# Patient Record
Sex: Female | Born: 1970 | Race: White | Hispanic: Yes | Marital: Married | Smoking: Never smoker
Health system: Southern US, Community
[De-identification: ages and names within clinical notes are randomized; demographics above are authoritative.]

## PROBLEM LIST (undated history)

## (undated) DIAGNOSIS — R112 Nausea with vomiting, unspecified: Secondary | ICD-10-CM

## (undated) DIAGNOSIS — B977 Papillomavirus as the cause of diseases classified elsewhere: Secondary | ICD-10-CM

## (undated) DIAGNOSIS — K589 Irritable bowel syndrome without diarrhea: Secondary | ICD-10-CM

## (undated) DIAGNOSIS — N852 Hypertrophy of uterus: Secondary | ICD-10-CM

## (undated) DIAGNOSIS — N888 Other specified noninflammatory disorders of cervix uteri: Secondary | ICD-10-CM

## (undated) DIAGNOSIS — M549 Dorsalgia, unspecified: Secondary | ICD-10-CM

## (undated) DIAGNOSIS — N83201 Unspecified ovarian cyst, right side: Secondary | ICD-10-CM

## (undated) DIAGNOSIS — K219 Gastro-esophageal reflux disease without esophagitis: Secondary | ICD-10-CM

## (undated) DIAGNOSIS — D219 Benign neoplasm of connective and other soft tissue, unspecified: Secondary | ICD-10-CM

## (undated) DIAGNOSIS — J42 Unspecified chronic bronchitis: Secondary | ICD-10-CM

## (undated) DIAGNOSIS — I10 Essential (primary) hypertension: Secondary | ICD-10-CM

## (undated) DIAGNOSIS — N83202 Unspecified ovarian cyst, left side: Secondary | ICD-10-CM

## (undated) DIAGNOSIS — E7800 Pure hypercholesterolemia, unspecified: Secondary | ICD-10-CM

## (undated) DIAGNOSIS — Z9889 Other specified postprocedural states: Secondary | ICD-10-CM

## (undated) DIAGNOSIS — N921 Excessive and frequent menstruation with irregular cycle: Principal | ICD-10-CM

## (undated) DIAGNOSIS — D649 Anemia, unspecified: Secondary | ICD-10-CM

## (undated) DIAGNOSIS — F32A Depression, unspecified: Secondary | ICD-10-CM

## (undated) DIAGNOSIS — N941 Unspecified dyspareunia: Secondary | ICD-10-CM

## (undated) DIAGNOSIS — T8859XA Other complications of anesthesia, initial encounter: Secondary | ICD-10-CM

## (undated) HISTORY — DX: Hypertrophy of uterus: N85.2

## (undated) HISTORY — PX: HEMANGIOMA EXCISION: SHX1734

## (undated) HISTORY — DX: Dorsalgia, unspecified: M54.9

## (undated) HISTORY — DX: Irritable bowel syndrome, unspecified: K58.9

## (undated) HISTORY — PX: BREAST CYST EXCISION: SHX579

## (undated) HISTORY — DX: Excessive and frequent menstruation with irregular cycle: N92.1

## (undated) HISTORY — DX: Unspecified ovarian cyst, right side: N83.201

## (undated) HISTORY — PX: OTHER SURGICAL HISTORY: SHX169

## (undated) HISTORY — DX: Papillomavirus as the cause of diseases classified elsewhere: B97.7

## (undated) HISTORY — DX: Other specified noninflammatory disorders of cervix uteri: N88.8

## (undated) HISTORY — DX: Unspecified dyspareunia: N94.10

## (undated) HISTORY — DX: Benign neoplasm of connective and other soft tissue, unspecified: D21.9

## (undated) HISTORY — PX: UTERINE FIBROID SURGERY: SHX826

## (undated) HISTORY — DX: Unspecified ovarian cyst, right side: N83.202

## (undated) HISTORY — PX: SHOULDER SURGERY: SHX246

---

## 2006-03-28 HISTORY — PX: BREAST CYST ASPIRATION: SHX578

## 2006-11-01 ENCOUNTER — Encounter (INDEPENDENT_AMBULATORY_CARE_PROVIDER_SITE_OTHER): Payer: Self-pay | Admitting: Radiology

## 2006-11-01 ENCOUNTER — Ambulatory Visit (HOSPITAL_COMMUNITY): Admission: RE | Admit: 2006-11-01 | Discharge: 2006-11-01 | Payer: Self-pay | Admitting: Family Medicine

## 2006-12-13 ENCOUNTER — Ambulatory Visit (HOSPITAL_COMMUNITY): Admission: RE | Admit: 2006-12-13 | Discharge: 2006-12-13 | Payer: Self-pay | Admitting: Family Medicine

## 2007-07-23 ENCOUNTER — Ambulatory Visit (HOSPITAL_COMMUNITY): Admission: RE | Admit: 2007-07-23 | Discharge: 2007-07-23 | Payer: Self-pay | Admitting: Family Medicine

## 2007-12-15 ENCOUNTER — Ambulatory Visit (HOSPITAL_COMMUNITY): Admission: RE | Admit: 2007-12-15 | Discharge: 2007-12-15 | Payer: Self-pay | Admitting: Family Medicine

## 2009-11-29 ENCOUNTER — Emergency Department (HOSPITAL_COMMUNITY): Admission: EM | Admit: 2009-11-29 | Discharge: 2009-11-29 | Payer: Self-pay | Admitting: Emergency Medicine

## 2010-04-18 ENCOUNTER — Encounter: Payer: Self-pay | Admitting: Family Medicine

## 2011-01-10 LAB — BODY FLUID CULTURE

## 2012-01-30 ENCOUNTER — Encounter: Payer: Self-pay | Admitting: Cardiology

## 2013-03-12 ENCOUNTER — Other Ambulatory Visit (HOSPITAL_COMMUNITY): Payer: Self-pay | Admitting: Physician Assistant

## 2013-03-12 DIAGNOSIS — R52 Pain, unspecified: Secondary | ICD-10-CM

## 2013-03-12 DIAGNOSIS — R112 Nausea with vomiting, unspecified: Secondary | ICD-10-CM

## 2013-03-18 ENCOUNTER — Ambulatory Visit (HOSPITAL_COMMUNITY)
Admission: RE | Admit: 2013-03-18 | Discharge: 2013-03-18 | Disposition: A | Discharge status: Home / Self Care | Payer: Self-pay | Source: Ambulatory Visit | Attending: Physician Assistant | Admitting: Physician Assistant

## 2013-03-18 DIAGNOSIS — R112 Nausea with vomiting, unspecified: Secondary | ICD-10-CM | POA: Insufficient documentation

## 2013-03-18 DIAGNOSIS — R197 Diarrhea, unspecified: Secondary | ICD-10-CM | POA: Insufficient documentation

## 2013-03-18 DIAGNOSIS — R10819 Abdominal tenderness, unspecified site: Secondary | ICD-10-CM | POA: Insufficient documentation

## 2013-03-18 DIAGNOSIS — K589 Irritable bowel syndrome without diarrhea: Secondary | ICD-10-CM | POA: Insufficient documentation

## 2013-03-18 DIAGNOSIS — R52 Pain, unspecified: Secondary | ICD-10-CM

## 2013-03-18 DIAGNOSIS — R350 Frequency of micturition: Secondary | ICD-10-CM | POA: Insufficient documentation

## 2013-03-18 DIAGNOSIS — R109 Unspecified abdominal pain: Secondary | ICD-10-CM | POA: Insufficient documentation

## 2013-03-18 MED ORDER — IOHEXOL 300 MG/ML  SOLN
100.0000 mL | Freq: Once | INTRAMUSCULAR | Status: AC | PRN
  Administered 2013-03-18: 100 mL via INTRAVENOUS

## 2013-03-28 HISTORY — PX: BREAST CYST EXCISION: SHX579

## 2013-04-16 ENCOUNTER — Other Ambulatory Visit: Payer: Self-pay | Admitting: Nurse Practitioner

## 2013-04-16 ENCOUNTER — Other Ambulatory Visit: Payer: Self-pay | Admitting: *Deleted

## 2013-04-16 DIAGNOSIS — R921 Mammographic calcification found on diagnostic imaging of breast: Secondary | ICD-10-CM

## 2013-04-22 ENCOUNTER — Ambulatory Visit
Admission: RE | Admit: 2013-04-22 | Discharge: 2013-04-22 | Disposition: A | Discharge status: Home / Self Care | Payer: No Typology Code available for payment source | Source: Ambulatory Visit | Attending: *Deleted | Admitting: *Deleted

## 2013-04-22 DIAGNOSIS — R921 Mammographic calcification found on diagnostic imaging of breast: Secondary | ICD-10-CM

## 2013-10-09 ENCOUNTER — Encounter: Payer: Self-pay | Admitting: *Deleted

## 2013-11-18 ENCOUNTER — Encounter (INDEPENDENT_AMBULATORY_CARE_PROVIDER_SITE_OTHER): Payer: Self-pay

## 2013-11-18 ENCOUNTER — Other Ambulatory Visit: Payer: Self-pay | Admitting: Gastroenterology

## 2013-11-18 ENCOUNTER — Encounter: Payer: Self-pay | Admitting: Gastroenterology

## 2013-11-18 ENCOUNTER — Ambulatory Visit (INDEPENDENT_AMBULATORY_CARE_PROVIDER_SITE_OTHER): Payer: Self-pay | Admitting: Gastroenterology

## 2013-11-18 VITALS — BP 110/72 | HR 69 | Temp 98.2°F | Ht 61.0 in | Wt 127.6 lb

## 2013-11-18 DIAGNOSIS — R1013 Epigastric pain: Secondary | ICD-10-CM

## 2013-11-18 DIAGNOSIS — K3189 Other diseases of stomach and duodenum: Secondary | ICD-10-CM

## 2013-11-18 DIAGNOSIS — R109 Unspecified abdominal pain: Secondary | ICD-10-CM

## 2013-11-18 MED ORDER — DICYCLOMINE HCL 10 MG PO CAPS: 10.0000 mg | ORAL_CAPSULE | Freq: Three times a day (TID) | ORAL | Status: DC

## 2013-11-18 MED ORDER — POLYETHYLENE GLYCOL 3350 17 GM/SCOOP PO POWD: ORAL | Status: DC

## 2013-11-18 MED ORDER — PEG 3350-KCL-NA BICARB-NACL 420 G PO SOLR: 4000.0000 mL | ORAL | Status: DC

## 2013-11-18 MED ORDER — OMEPRAZOLE 20 MG PO CPDR: 20.0000 mg | DELAYED_RELEASE_CAPSULE | Freq: Every day | ORAL | Status: DC
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
## 2013-11-18 NOTE — Patient Instructions (Signed)
Start taking Prilosec each morning, 30 minutes before breakfast. This is for reflux and stomach inflammation.  Start taking Miralax one capful each evening as needed for constipation.   You may take Bentyl 1 capsule with meals and at bedtime as needed for abdominal discomfort and loose stool. Watch for constipation, dry mouth, dizziness with this.   We need to proceed with a colonoscopy and upper endoscopy once insurance is obtained.

## 2013-11-21 DIAGNOSIS — R1013 Epigastric pain: Secondary | ICD-10-CM | POA: Insufficient documentation

## 2013-11-21 DIAGNOSIS — R109 Unspecified abdominal pain: Secondary | ICD-10-CM | POA: Insufficient documentation

## 2013-11-21 NOTE — Assessment & Plan Note (Signed)
Early satiety, occasional N/V, reported scant hematemesis in the past. Likely secondary to esophagitis, gastritis, possible PUD. Scant improvement with Protonix. Trial Prilosec once daily.   Proceed with upper endoscopy in the near future with Dr. Oneida Alar. The risks, benefits, and alternatives have been discussed in detail with patient. They have stated understanding and desire to proceed.

## 2013-11-21 NOTE — Assessment & Plan Note (Signed)
43 year old Spanish-speaking female with chronic lower abdominal pain, alternating constipation and diarrhea. Occasional low-volume hematochezia. Question IBS but needs colonoscopy to exclude occult etiology.   Proceed with colonoscopy with Dr. Oneida Alar in the near future. The risks, benefits, and alternatives have been discussed in detail with the patient. They state understanding and desire to proceed.  Miralax for constipation Bentyl for loose stool

## 2013-11-26 NOTE — Progress Notes (Signed)
CC TO PCP °

## 2013-12-03 ENCOUNTER — Encounter (HOSPITAL_COMMUNITY): Payer: Self-pay | Admitting: Pharmacy Technician

## 2013-12-12 ENCOUNTER — Other Ambulatory Visit: Payer: Self-pay

## 2013-12-12 ENCOUNTER — Telehealth: Payer: Self-pay | Admitting: General Practice

## 2013-12-12 DIAGNOSIS — R1013 Epigastric pain: Secondary | ICD-10-CM

## 2013-12-12 NOTE — Telephone Encounter (Signed)
Procedure cancelled and I lmom for [name].

## 2013-12-12 NOTE — Telephone Encounter (Signed)
Patient would like to cancel her procedure for Friday and she will call back at later date to reschedule.

## 2013-12-13 ENCOUNTER — Ambulatory Visit (HOSPITAL_COMMUNITY): Admission: RE | Admit: 2013-12-13 | Payer: Self-pay | Source: Ambulatory Visit | Admitting: Gastroenterology

## 2013-12-13 ENCOUNTER — Encounter (HOSPITAL_COMMUNITY): Admission: RE | Payer: Self-pay | Source: Ambulatory Visit

## 2013-12-13 SURGERY — COLONOSCOPY
Anesthesia: Moderate Sedation

## 2013-12-24 ENCOUNTER — Telehealth: Payer: Self-pay | Admitting: Gastroenterology

## 2013-12-24 ENCOUNTER — Ambulatory Visit (HOSPITAL_COMMUNITY)
Admission: RE | Admit: 2013-12-24 | Discharge: 2013-12-24 | Disposition: A | Discharge status: Home / Self Care | Payer: Self-pay | Source: Ambulatory Visit | Attending: Gastroenterology | Admitting: Gastroenterology

## 2013-12-24 ENCOUNTER — Encounter (HOSPITAL_COMMUNITY)
Admission: RE | Disposition: A | Discharge status: Home / Self Care | Payer: Self-pay | Source: Ambulatory Visit | Attending: Gastroenterology

## 2013-12-24 DIAGNOSIS — R1013 Epigastric pain: Secondary | ICD-10-CM

## 2013-12-24 DIAGNOSIS — K921 Melena: Secondary | ICD-10-CM | POA: Insufficient documentation

## 2013-12-24 DIAGNOSIS — Q438 Other specified congenital malformations of intestine: Secondary | ICD-10-CM | POA: Insufficient documentation

## 2013-12-24 DIAGNOSIS — K648 Other hemorrhoids: Secondary | ICD-10-CM | POA: Insufficient documentation

## 2013-12-24 DIAGNOSIS — R109 Unspecified abdominal pain: Secondary | ICD-10-CM

## 2013-12-24 HISTORY — PX: COLONOSCOPY: SHX5424

## 2013-12-24 SURGERY — COLONOSCOPY
Anesthesia: Moderate Sedation

## 2013-12-24 MED ORDER — ONDANSETRON HCL 4 MG/2ML IJ SOLN
INTRAMUSCULAR | Status: AC
  Filled 2013-12-24: qty 2

## 2013-12-24 MED ORDER — FLUMAZENIL 0.5 MG/5ML IV SOLN
INTRAVENOUS | Status: AC
  Filled 2013-12-24: qty 5

## 2013-12-24 MED ORDER — MEPERIDINE HCL 100 MG/ML IJ SOLN
INTRAMUSCULAR | Status: AC
  Filled 2013-12-24: qty 2

## 2013-12-24 MED ORDER — MEPERIDINE HCL 100 MG/ML IJ SOLN
INTRAMUSCULAR | Status: DC | PRN
  Administered 2013-12-24: 25 mg

## 2013-12-24 MED ORDER — LIDOCAINE VISCOUS 2 % MT SOLN
OROMUCOSAL | Status: AC
  Filled 2013-12-24: qty 15

## 2013-12-24 MED ORDER — STERILE WATER FOR IRRIGATION IR SOLN
Status: DC | PRN
  Administered 2013-12-24: 11:00:00

## 2013-12-24 MED ORDER — FLUMAZENIL 0.5 MG/5ML IV SOLN
INTRAVENOUS | Status: DC | PRN
  Administered 2013-12-24: .5 mg via INTRAVENOUS

## 2013-12-24 MED ORDER — MIDAZOLAM HCL 5 MG/5ML IJ SOLN
INTRAMUSCULAR | Status: AC
  Filled 2013-12-24: qty 10

## 2013-12-24 MED ORDER — FLUMAZENIL 0.5 MG/5ML IV SOLN: 0.5000 mg | Freq: Once | INTRAVENOUS | Status: DC

## 2013-12-24 MED ORDER — LIDOCAINE VISCOUS 2 % MT SOLN
OROMUCOSAL | Status: DC | PRN
  Administered 2013-12-24: 1 via OROMUCOSAL

## 2013-12-24 MED ORDER — ONDANSETRON HCL 4 MG/2ML IJ SOLN
INTRAMUSCULAR | Status: DC | PRN
  Administered 2013-12-24: 4 mg via INTRAVENOUS

## 2013-12-24 MED ORDER — SODIUM CHLORIDE 0.9 % IV SOLN
INTRAVENOUS | Status: DC
  Administered 2013-12-24: 1000 mL via INTRAVENOUS

## 2013-12-24 MED ORDER — MIDAZOLAM HCL 5 MG/5ML IJ SOLN
INTRAMUSCULAR | Status: DC | PRN
  Administered 2013-12-24 (×2): 2 mg via INTRAVENOUS

## 2013-12-24 NOTE — Discharge Instructions (Addendum)
You have internal hemorrhoids. YOU DID NOT HAVE ANY POLYPS. YOUR BLOOD PRESSURE WAS LOW SO WE COULD NOT COMPLETE THE UPPER ENDOSCOPY. YOU WERE GIVEN ZOFRAN, DEMEROL, AND VERSED.    RETURN FOR UPPER ENDOSCOPY WITH PROPOFOL.  FOLLOW A HIGH FIBER DIET. AVOID ITEMS THAT CAUSE BLOATING. SEE INFO BELOW.  Next colonoscopy in 10 years.   Colonoscopy Care After Read the instructions outlined below and refer to this sheet in the next week. These discharge instructions provide you with general information on caring for yourself after you leave the hospital. While your treatment has been planned according to the most current medical practices available, unavoidable complications occasionally occur. If you have any problems or questions after discharge, call DR. FIELDS, [phone].  ACTIVITY  You may resume your regular activity, but move at a slower pace for the next 24 hours.   Take frequent rest periods for the next 24 hours.   Walking will help get rid of the air and reduce the bloated feeling in your belly (abdomen).   No driving for 24 hours (because of the medicine (anesthesia) used during the test).   You may shower.   Do not sign any important legal documents or operate any machinery for 24 hours (because of the anesthesia used during the test).    NUTRITION  Drink plenty of fluids.   You may resume your normal diet as instructed by your doctor.   Begin with a light meal and progress to your normal diet. Heavy or fried foods are harder to digest and may make you feel sick to your stomach (nauseated).   Avoid alcoholic beverages for 24 hours or as instructed.    MEDICATIONS  You may resume your normal medications.   WHAT YOU CAN EXPECT TODAY  Some feelings of bloating in the abdomen.   Passage of more gas than usual.   Spotting of blood in your stool or on the toilet paper  .  IF YOU HAD POLYPS REMOVED DURING THE COLONOSCOPY:  Eat a soft diet IF YOU HAVE NAUSEA,  BLOATING, ABDOMINAL PAIN, OR VOMITING.    FINDING OUT THE RESULTS OF YOUR TEST Not all test results are available during your visit. DR. Oneida Alar WILL CALL YOU WITHIN 7 DAYS OF YOUR PROCEDUE WITH YOUR RESULTS. Do not assume everything is normal if you have not heard from DR. FIELDS IN ONE WEEK, CALL HER OFFICE AT [phone].  SEEK IMMEDIATE MEDICAL ATTENTION AND CALL THE OFFICE: [phone] IF:  You have more than a spotting of blood in your stool.   Your belly is swollen (abdominal distention).   You are nauseated or vomiting.   You have a temperature over 101F.   You have abdominal pain or discomfort that is severe or gets worse throughout the day.  High-Fiber Diet A high-fiber diet changes your normal diet to include more whole grains, legumes, fruits, and vegetables. Changes in the diet involve replacing refined carbohydrates with unrefined foods. The calorie level of the diet is essentially unchanged. The Dietary Reference Intake (recommended amount) for adult males is 38 grams per day. For adult females, it is 25 grams per day. Pregnant and lactating women should consume 28 grams of fiber per day. Fiber is the intact part of a plant that is not broken down during digestion. Functional fiber is fiber that has been isolated from the plant to provide a beneficial effect in the body. PURPOSE  Increase stool bulk.   Ease and regulate bowel movements.   Lower cholesterol.  INDICATIONS THAT YOU NEED MORE FIBER  Constipation and hemorrhoids.   Uncomplicated diverticulosis (intestine condition) and irritable bowel syndrome.   Weight management.   As a protective measure against hardening of the arteries (atherosclerosis), diabetes, and cancer.   GUIDELINES FOR INCREASING FIBER IN THE DIET  Start adding fiber to the diet slowly. A gradual increase of about 5 more grams (2 slices of whole-wheat bread, 2 servings of most fruits or vegetables, or 1 bowl of high-fiber cereal) per day  is best. Too rapid an increase in fiber may result in constipation, flatulence, and bloating.   Drink enough water and fluids to keep your urine clear or pale yellow. Water, juice, or caffeine-free drinks are recommended. Not drinking enough fluid may cause constipation.   Eat a variety of high-fiber foods rather than one type of fiber.   Try to increase your intake of fiber through using high-fiber foods rather than fiber pills or supplements that contain small amounts of fiber.   The goal is to change the types of food eaten. Do not supplement your present diet with high-fiber foods, but replace foods in your present diet.  INCLUDE A VARIETY OF FIBER SOURCES  Replace refined and processed grains with whole grains, canned fruits with fresh fruits, and incorporate other fiber sources. White rice, white breads, and most bakery goods contain little or no fiber.   Brown whole-grain rice, buckwheat oats, and many fruits and vegetables are all good sources of fiber. These include: broccoli, Brussels sprouts, cabbage, cauliflower, beets, sweet potatoes, white potatoes (skin on), carrots, tomatoes, eggplant, squash, berries, fresh fruits, and dried fruits.   Cereals appear to be the richest source of fiber. Cereal fiber is found in whole grains and bran. Bran is the fiber-rich outer coat of cereal grain, which is largely removed in refining. In whole-grain cereals, the bran remains. In breakfast cereals, the largest amount of fiber is found in those with "bran" in their names. The fiber content is sometimes indicated on the label.   You may need to include additional fruits and vegetables each day.   In baking, for 1 cup white flour, you may use the following substitutions:   1 cup whole-wheat flour minus 2 tablespoons.   1/2 cup white flour plus 1/2 cup whole-wheat flour.   Hemorrhoids Hemorrhoids are dilated (enlarged) veins around the rectum. Sometimes clots will form in the veins. This makes  them swollen and painful. These are called thrombosed hemorrhoids. Causes of hemorrhoids include:  Constipation.   Straining to have a bowel movement.   HEAVY LIFTING HOME CARE INSTRUCTIONS  Eat a well balanced diet and drink 6 to 8 glasses of water every day to avoid constipation. You may also use a bulk laxative.   Avoid straining to have bowel movements.   Keep anal area dry and clean.   Do not use a donut shaped pillow or sit on the toilet for long periods. This increases blood pooling and pain.   Move your bowels when your body has the urge; this will require less straining and will decrease pain and pressure.   Hemorroides  (Hemorrhoids) Las hemorroides son venas inflamadas alrededor del recto o del ano. Hay dos tipos de hemorroides:   Hemorroides internas. Se forman en las venas del interior del recto. Pueden abultarse hacia el exterior e irritarse y Secretary/administrator.  Hemorroides externas. Se producen en las venas externas al ano y pueden sentirse como un bulto o zona hinchada dura y dolorosa cerca del ano. CAUSAS  Embarazo.   Obesidad.   Constipacin o diarrea.   Dificultad para mover el intestino.   Permanecer sentado durante largos perodos en el inodoro.  Levantar objetos pesados u otras actividades que impliquen esfuerzo.  Sexo anal. SNTOMAS   Dolor.   Picazn o irritacin anal.   Sangrado rectal.   Prdida fecal.   Inflamacin anal.   Uno o ms bultos en la zona anal.  DIAGNSTICO  El mdico puede diagnosticar las hemorroides mediante un examen visual. Otros estudios o anlisis que se pueden realizar son:   Examen de la zona rectal con Ardelia Mems mano enguantada (examen digital rectal).   Examen del canal anal utilizando un pequeo tubo (endoscopio).   Anlisis de sangre si ha perdido [place] cantidad significativa de [place].  Un estudio para observar el interior del colon (sigmoideoscopa o colonoscopa). TRATAMIENTO  La mayora de las  hemorroides pueden tratarse en casa. Sin embargo, si los sntomas no mejoran o tiene Nurse, children's, el mdico puede Optometrist un procedimiento para disminuir las hemorroides o extirparlas completamente. Los tratamientos posibles son:   Colocacin de una banda de goma en la base de la hemorroide para cortar la circulacin (ligadura con Forensic psychologist).   Inyeccin de una sustancia qumica para Neurosurgeon las hemorroides (escleroterapia).   Utilizacin de un instrumento para quemar las hemorroides (terapia con luz infrarroja).   Extirpacin quirrgica de la hemorroides (hemorroidectoma).   Colocacin de grapas en la hemorroides para bloquear el flujo de sangre a los tejidos (engrapado de hemorroides).  INSTRUCCIONES PARA EL CUIDADO EN EL HOGAR   Consuma alimentos con fibra, como cereales integrales, legumbres, frutos secos, frutas y verduras. Pregntele a su mdico acerca de tomar productos con fibra aadida en ellos (suplementos defibra).  Aumente la ingestin de lquidos. Beba gran cantidad de lquido para mantener la orina de tono claro o color amarillo plido.   Haga ejercicios regularmente.   Vaya al bao cuando sienta la necesidad de mover el intestino. No espere.   Evite hacer fuerza al mover el intestino.   Mantenga la zona anal limpia y seca. Use papel higinico hmedo o toallitas humedecidas despus de mover el intestino.   Puede usar o Midwife segn las indicaciones algunas cremas especiales y supositorios.   Tome slo medicamentos de venta libre o recetados, segn las indicaciones del mdico.   Tome baos de asiento tibios durante 15-20 minutos, 3-4 veces por da para Glass blower/designer y las [place].   Coloque una bolsa de hielo sobre las hemorroides si le duelen o se hinchan. Usar compresas de [employer] baos de asiento puede ser [place].   Ponga el hielo en una bolsa plstica.   Colquese una toalla entre la piel y la bolsa de hielo.   Deje el  hielo durante 15 - 20 minutos y aplquelo 3 - 4 veces por Training and development officer.   No utilice una almohada en forma de aro ni se siente en el inodoro durante perodos prolongados. Esto aumenta la afluencia de sangre y Conservation officer, historic buildings.  SOLICITE ATENCIN MDICA SI:   Aumenta el dolor y la hinchazn y no puede controlarlo con la medicacin o con Lexicographer.  Tiene un sangrado que no IT consultant.  No puede mover el intestino.  Siente dolor o tiene inflamacin fuera de la zona de las hemorroides. ASEGRESE DE QUE:   Comprende estas instrucciones.  Controlar su enfermedad.  Recibir ayuda de inmediato si no mejora o si empeora. Document Released: 03/14/2005 Document Revised: 11/14/2012 ExitCare Patient Information 2015  [employer]. This information is not intended to replace advice given to you by your health care provider. Make sure you discuss any questions you have with your health care provider. Dieta con alto contenido de fibra  (High [employer] Diet) La fibra se encuentra en frutas, verduras y granos. Una dieta con alto contenido en fibras se favorece con la adicin de ms granos enteros, legumbres, frutas y verduras en su dieta. La cantidad recomendada de fibra para los hombres adultos es de 38 g por da. Para las mujeres adultas es de 25 g por da. Las [employer] y las que amamantan deben consumir 83 gramos de fibra por Training and development officer. Si usted tiene un problema digestivo o intestinal, consulte a su mdico antes de la adicin de alimentos ricos en fibra a su dieta. Coma una variedad de alimentos ricos en fibra en lugar de slo unos pocos.  OBJETIVO   Aumentar la masa fecal.  Tener deposiciones ms regulares para evitar el estreimiento.  Reducir el colesterol.  Para evitar comer en exceso. [place]?   En caso de estreimiento y hemorroides.  En caso de diverticulosis no complicada (enfermedad intestinal) y en el sndrome del colon irritable.  Si necesita ayuda para el  control de [place].  Si desea mejorar su dieta como medida de proteccin contra la aterosclerosis, la diabetes y Science writer. [place] y cereales integrales.  Frutas, como las [place], [place], pltanos, fresas, Development worker, community y peras.  Verduras, como guisantes, zanahorias, batatas, remolachas, brcoli, repollo, espinacas y alcauciles.  Legumbres, las arvejas, soja, lentejas.  Almendras. CONTENIDO DE FIBRA DE LOS ALIMENTOS  Almidones y granos / Heritage manager (g)   Cheerios, 1 taza / 3 g  Corn Flakes, 1 taza / 0,7 g  Arroz inflado, 1  tazas / 0,3 g  Harina de avena instantnea (cocida),  taza / 2 g  Cereal de trigo escarchado, 1 taza / 5,1 g  Arroz marrn grano largo (cocido), 1 taza / 3,5 g  Arroz blanco grano largo (cocido), 1 taza / 0,6 g  Macarrones enriquecidos (cocidos), 1 taza / 2,5 g Legumbres / Fibra Diettica (g)   Frijoles cocidos (enlatados, crudos o vegetarianos),  taza / 5,2 g  Frijoles (enlatados),  taza / 6,8 g  Frijoles pintos (cocidos),  taza / 5,5 g Panes y Administrator / Heritage manager (g)   Galletas de graham o miel, 2 plazas / 0,7 g  Galletitas saladas, 3 unidades / 0,3 g  Pretzels salados comunes, 10 pedazos / 1,8 g  Pan integral, 1 rebanada / 1,9 g  Pan blanco, 1 rebanada / 0,7 g  Pan con pasas, 1 rebanada / 1,2 g  Bagel 3 oz / 2 g  Tortilla de harina, 1 oz / 0.9 g  Tortilla de maz, 1 pequea / 1,5 g  Pan de [place] o hot dog, 1 pequeo / 0,9 g Frutas / Fibra Diettica (g)   Manzana con piel, 1 mediana / 4,4 g  Pur de [employer],  taza / 1,5 g  [place],  mediano / 1,5 g  Uvas, 10 uvas / 0,4 g  Naranja, 1 pequea / 2,3 g  Pasas, 1,5 oz / 1.6 g  Meln, 1 taza / 1,4 g Vegetales / Fibra Diettica (g)   Judas verdes (en conserva),  taza / 1,3 g  Zanahorias (cocido),  taza / 2,3 g  Broccoli (cocido),  taza / 2,8 g  Guisantes (cocidos),  taza / 4,4 g  Pur de papas,  taza / 1,6  g  Lechuga, 1 taza / 0,5 g  Maz (en lata),  taza / 1,6 g  Tomate,  taza / 1,1 g 1 cup / 3 g. Document Released: 03/14/2005 Document Revised: 09/13/2011 [facility] Patient Information 2015 [place], [place]. This information is not intended to replace advice given to you by your health care provider. Make sure you discuss any questions you have with your health care provider. Colonoscopa: cuidados posteriores (Colonoscopy, Care After) Siga estas instrucciones durante las prximas semanas. Estas indicaciones le proporcionan informacin general acerca de cmo deber cuidarse despus del procedimiento. El mdico tambin podr darle instrucciones ms especficas. El tratamiento ha sido planificado segn las prcticas mdicas actuales, pero en algunos casos pueden ocurrir problemas. Comunquese con el mdico si tiene algn problema o tiene dudas despus del procedimiento. QU ESPERAR DESPUS DEL PROCEDIMIENTO  Despus del procedimiento, es comn tener las siguientes sensaciones:  Una pequea cantidad de sangre en la materia fecal.  Cantidades moderadas de gases e hinchazn o calambres abdominales leves. INSTRUCCIONES PARA EL CUIDADO EN EL HOGAR  No conduzca vehculos, opere maquinarias ni firme documentos importantes durante 24horas.  Puede ducharse y retomar sus actividades fsicas habituales, pero muvase a un ritmo ms lento durante las primeras 24horas.  Tmese descansos frecuentes durante las primeras 24horas.  Camine o colquese compresas calientes en el abdomen para ayudar a reducir los calambres e hinchazn abdominales.  Beba suficiente lquido para Consulting civil engineer orina clara o de color amarillo plido.  Puede retomar su dieta normal segn las instrucciones de su mdico. Evite los alimentos pesados o fritos que son difciles de Publishing copy.  Evite consumir alcohol durante 24horas o segn las instrucciones de su mdico.  Tome solo medicamentos de venta libre o recetados, segn las  indicaciones del mdico.  Si se obtuvo una muestra de tejido (biopsia) durante el procedimiento:  No tome aspirina ni anticoagulantes durante 7das, o segn las instrucciones de su mdico.  No consuma alcohol durante 7das o segn las instrucciones de su mdico.  Consuma alimentos livianos durante las primeras 24horas. SOLICITE ATENCIN MDICA SI: Tiene manchas persistentes de sangre en la materia fecal entre 2 y 3das posteriores al procedimiento. SOLICITE ATENCIN MDICA DE INMEDIATO SI:  Tiene ms que una pequea mancha de sangre en la materia fecal.  Elimina grandes cogulos de sangre en la materia fecal.  Tiene el abdomen hinchado (distendido).  Tiene nuseas o vmitos.  Tiene fiebre.  Siente dolor intenso en el abdomen que no se alivia con los [employer]. Document Released: 06/10/2008 Document Revised: 01/02/2013 [facility] Patient Information 2015 [place]. This information is not intended to replace advice given to you by your health care provider. Make sure you discuss any questions you have with your health care provider.

## 2013-12-24 NOTE — Progress Notes (Signed)
REVIEWED.  

## 2013-12-24 NOTE — Progress Notes (Signed)
PT INITIAL SBP 119. AFTER [id number] SBP AS LOW AS 69. SBP DID NOT IMPROVE WITH IVFs. ROMAZICON GIVEN. SBP 110. PT REMAINED COHERENT & RESPONSIVE DURING LOW SBP. BEING RECOVERED IN PACU.

## 2013-12-24 NOTE — Telephone Encounter (Signed)
Nurse Olivia Mackie) called from short stay to let us know that pt had tcs today but not her EGD because her blood pressure dropped. She needs to be rescheduled.
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
## 2013-12-24 NOTE — Progress Notes (Signed)
Blood pressure stayed low throughout colonoscopy, waited approximately 20 min for pressure to come up. Placed in trendelenburg ,forced fluids, performed  Valsalva maneuver no success. Patient awake and responsive to command.  Endoscopy Procedure cancelled per Dr. Oneida Alar order
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
## 2013-12-25 ENCOUNTER — Encounter (HOSPITAL_COMMUNITY): Payer: Self-pay | Admitting: Gastroenterology

## 2013-12-25 NOTE — Telephone Encounter (Signed)
Would she need another office visit before we reschedule her for the EGD. Please advise

## 2013-12-25 NOTE — Telephone Encounter (Signed)
Triage for EGD W/ MAC.

## 2013-12-26 NOTE — Telephone Encounter (Signed)
Forward to DS to triage

## 2014-02-19 ENCOUNTER — Telehealth: Payer: Self-pay

## 2014-02-19 NOTE — Telephone Encounter (Signed)
Gastroenterology Pre-Procedure Review  Request Date: 02/19/2014 Requesting Physician: Dr. Oneida Alar  PATIENT REVIEW QUESTIONS: The patient responded to the following health history questions as indicated:    PER DR. FIELDS NOTE PT WAS TRIAGED FOR EGD ( WAS UNABLE TO DO WHEN COLONOSCOPY WAS DONE)  1. Diabetes Melitis: no 2. Joint replacements in the past 12 months: no 3. Major health problems in the past 3 months: no 4. Has an artificial valve or MVP: no 5. Has a defibrillator: no 6. Has been advised in past to take antibiotics in advance of a procedure like teeth cleaning: no    MEDICATIONS & ALLERGIES:    Patient reports the following regarding taking any blood thinners:   Plavix? no Aspirin? no Coumadin? no  Patient confirms/reports the following medications:  Current Outpatient Prescriptions  Medication Sig Dispense Refill  . acetaminophen (TYLENOL) 500 MG tablet Take 1,000 mg by mouth every 6 (six) hours as needed for mild pain.    Marland Kitchen ibuprofen (ADVIL,MOTRIN) 200 MG tablet Take 400 mg by mouth every 6 (six) hours as needed for moderate pain.     No current facility-administered medications for this visit.    Patient confirms/reports the following allergies:  Allergies  Allergen Reactions  . Robitussin (Alcohol Free) [Guaifenesin]   . Doxycycline Hives  . Tetracyclines & Related Hives    No orders of the defined types were placed in this encounter.    AUTHORIZATION INFORMATION Primary Insurance:   ID #:  Group #:  Pre-Cert / Auth required: Pre-Cert / Auth #:   Secondary Insurance:   ID #: Group #:  Pre-Cert / Auth required: Pre-Cert / Auth #:  SCHEDULE INFORMATION: Procedure has been scheduled as follows:  Date: 03/18/2014               Time:  8:45 AM Location: [facility] Short Stay  This Gastroenterology Pre-Precedure Review Form is being routed to the following provider(s): Barney Drain, MD

## 2014-03-03 NOTE — Telephone Encounter (Signed)
MOVI PREP SPLIT DOSING, REGULAR BREAKFAST. CLEAR LIQUIDS AFTER 9 AM.  

## 2014-03-04 ENCOUNTER — Other Ambulatory Visit: Payer: Self-pay

## 2014-03-04 DIAGNOSIS — R1013 Epigastric pain: Secondary | ICD-10-CM

## 2014-03-04 NOTE — Telephone Encounter (Signed)
Instructions for the EGD mailed to pt with pre-op appt also.

## 2014-03-10 NOTE — Patient Instructions (Addendum)
Joyce Roberts  03/10/2014   Your procedure is scheduled on:  03/18/2014  Report to [facility] at  71  AM.  Call this number if you have problems the morning of surgery: [phone]   Remember:   Do not eat food or drink liquids after midnight.   Take these medicines the morning of surgery with A SIP OF WATER: none   Do not wear jewelry, make-up or nail polish.  Do not wear lotions, powders, or perfumes.   Do not shave 48 hours prior to surgery. Men may shave face and neck.  Do not bring valuables to the hospital.  [facility] is not responsible for any belongings or valuables.               Contacts, dentures or bridgework may not be worn into surgery.  Leave suitcase in the car. After surgery it may be brought to your room.  For patients admitted to the hospital, discharge time is determined by your treatment team.               Patients discharged the day of surgery will not be allowed to drive home.  Name and phone number of your driver: family  Special Instructions: N/A   Please read over the following fact sheets that you were given: Pain Booklet, Coughing and Deep Breathing, Surgical Site Infection Prevention, Anesthesia Post-op Instructions and Care and Recovery After Surgery Esophagogastroduodenoscopy Esophagogastroduodenoscopy (EGD) is a procedure to examine the lining of the esophagus, stomach, and first part of the small intestine (duodenum). A long, flexible, lighted tube with a camera attached (endoscope) is inserted down the throat to view these organs. This procedure is done to detect problems or abnormalities, such as inflammation, bleeding, ulcers, or growths, in order to treat them. The procedure lasts about 5-20 minutes. It is usually an outpatient procedure, but it may need to be performed in emergency cases in the hospital. LET YOUR CAREGIVER KNOW ABOUT:   Allergies to food or medicine.  All medicines you are taking, including vitamins, herbs,  eyedrops, and over-the-counter medicines and creams.  Use of steroids (by mouth or creams).  Previous problems you or members of your family have had with the use of anesthetics.  Any blood disorders you have.  Previous surgeries you have had.  Other health problems you have.  Possibility of pregnancy, if this applies. RISKS AND COMPLICATIONS  Generally, EGD is a safe procedure. However, as with any procedure, complications can occur. Possible complications include:  Infection.  Bleeding.  Tearing (perforation) of the esophagus, stomach, or duodenum.  Difficulty breathing or not being able to breath.  Excessive sweating.  Spasms of the larynx.  Slowed heartbeat.  Low blood pressure. BEFORE THE PROCEDURE  Do not eat or drink anything for 6-8 hours before the procedure or as directed by your caregiver.  Ask your caregiver about changing or stopping your regular medicines.  If you wear dentures, be prepared to remove them before the procedure.  Arrange for someone to drive you home after the procedure. PROCEDURE   A vein will be accessed to give medicines and fluids. A medicine to relax you (sedative) and a pain reliever will be given through that access into the vein.  A numbing medicine (local anesthetic) may be sprayed on your throat for comfort and to stop you from gagging or coughing.  A mouth guard may be placed in your mouth to protect your teeth and to keep you  from biting on the endoscope.  You will be asked to lie on your left side.  The endoscope is inserted down your throat and into the esophagus, stomach, and duodenum.  Air is put through the endoscope to allow your caregiver to view the lining of your esophagus clearly.  The esophagus, stomach, and duodenum is then examined. During the exam, your caregiver may:  Remove tissue to be examined under a microscope (biopsy) for inflammation, infection, or other medical problems.  Remove  growths.  Remove objects (foreign bodies) that are stuck.  Treat any bleeding with medicines or other devices that stop tissues from bleeding (hot cautery, clipping devices).  Widen (dilate) or stretch narrowed areas of the esophagus and stomach.  The endoscope will then be withdrawn. AFTER THE PROCEDURE  You will be taken to a recovery area to be monitored. You will be able to go home once you are stable and alert.  Do not eat or drink anything until the local anesthetic and numbing medicines have worn off. You may choke.  It is normal to feel bloated, have pain with swallowing, or have a sore throat for a short time. This will wear off.  Your caregiver should be able to discuss his or her findings with you. It will take longer to discuss the test results if any biopsies were taken. Document Released: 07/15/2004 Document Revised: 07/29/2013 Document Reviewed: 02/15/2012 [facility] Patient Information 2015 [place], [place]. This information is not intended to replace advice given to you by your health care provider. Make sure you discuss any questions you have with your health care provider. PATIENT INSTRUCTIONS POST-ANESTHESIA  IMMEDIATELY FOLLOWING SURGERY:  Do not drive or operate machinery for the first twenty four hours after surgery.  Do not make any important decisions for twenty four hours after surgery or while taking narcotic pain medications or sedatives.  If you develop intractable nausea and vomiting or a severe headache please notify your doctor immediately.  FOLLOW-UP:  Please make an appointment with your surgeon as instructed. You do not need to follow up with anesthesia unless specifically instructed to do so.  WOUND CARE INSTRUCTIONS (if applicable):  Keep a dry clean dressing on the anesthesia/puncture wound site if there is drainage.  Once the wound has quit draining you may leave it open to air.  Generally you should leave the bandage intact for twenty four hours unless  there is drainage.  If the epidural site drains for more than 36-48 hours please call the anesthesia department.  QUESTIONS?:  Please feel free to call your physician or the hospital operator if you have any questions, and they will be happy to assist you.      Esofagogastroduodenoscopa  (Esophagogastroduodenoscopy) La esofagogastroduodenoscopia es un procedimiento para examinar el revestimiento del esfago, el estmago y la primera parte del intestino delgado (duodeno). Para ver estos rganos se inserta en la garganta un tubo largo, flexible y con iluminacin, conectado a una cmara (endoscopio). Este procedimiento se realiza para Airline pilot o anormalidades como inflamacin, sangrado, lceras o tumores, con el fin de tratarlos. El procedimiento dura aproximadamente entre 5 y 68 minutos. Por lo general, se trata de un procedimiento ambulatorio, pero es posible que sea necesario realizarlo en el hospital en casos de Freight forwarder.  INFORME A SU MDICO ACERCA DE:   Alergias a alimentos o medicamentos.  Todos los [employer] [place], incluyendo vitaminas, hierbas, gotas oftlmicas, medicamentos de [place] y Proofreader.  Uso de corticoides (por va oral o cremas).  Problemas previos que usted o los [employer] de su familia hayan tenido con el uso de anestsicos.  Enfermedades de [employer].  Cirugas previas.  Otros problemas de [place].  Posibilidad de embarazo, si corresponde. RIESGOS Y COMPLICACIONES  En general es un procedimiento seguro. Sin embargo, Games developer procedimiento, pueden surgir complicaciones. Las complicaciones posibles son:   Infecciones.  Sangrado.  Ruptura (perforacin) del esfago, el estmago o del duodeno.  Dificultad para respirar o no poder respirar.  Sudoracin excesiva.  Espasmos de laringe  Disminucin de los latidos cardacos.  Presin arterial baja. ANTES DEL PROCEDIMIENTO   No coma ni beba desde las 8 horas anteriores al  procedimiento, o segn le indique el mdico.  Consulte a su mdico si debe cambiar o suspender los medicamentos que toma habitualmente.  Si [place] dientes postizos, preprese para quitrselos antes del procedimiento.  Pdale a alguna persona que la lleve a su casa luego del procedimiento. PROCEDIMIENTO   Le administrarn medicamentos y lquidos a travs de una vena. Le darn un medicamento para relajarlo (sedante) y un analgsico a travs del acceso a la vena.  Le rociarn la garganta con un medicamento para adormecer (anestsico local ) para Merchandiser, retail, nuseas y la tos.  Le colocarn un protector bucal para proteger los dientes y para evitar que muerda el endoscopio.  Le pedirn que se recueste sobre el costado izquierdo.  El endoscopio se introduce por la Advertising copywriter al esfago, el estmago y [place].  Se introduce aire a travs del endoscopio para permitirle al mdico ver el revestimiento del esfago.  Se examina el esfago, el estmago y [place]. Durante el examen, el mdico:  Podr retirar Truddie Coco de tejido para examinarla en el microscopio (biopsia) para diagnosticar inflamacin infecciones u otros problemas mdicos.  Extirpar tumores.  Retirar objetos (cuerpos extraos) que estn atascados.  Tratar el sangrado con medicamentos u otros dispositivos que impiden que los tejidos sangren (cauterizador con Freight forwarder, instrumentos de pinzamiento).  Ensanchar (dilatar) o estirar reas estrechas del esfago y Product manager.  Luego se retirar el endoscopio. DESPUS DEL PROCEDIMIENTO   Lo trasladarn una sala de recuperacin para hacerle controles. Podr volver a su casa una vez que se encuentre estable y Hydrographic surveyor.  No coma ni beba nada hasta que el anestsico local haya desaparecido. Podra ahogarse.  Es normal que se sienta hinchado, sienta dolor al tragar o Risk analyst de garganta por un corto tiempo. Esto desaparecer.  El mdico comentar los hallazgos con  usted. Demorar ms [employer] de las pruebas si se tomaron biopsias. Document Released: 07/09/2012 [facility] Patient Information 2015 [place]. This information is not intended to replace advice given to you by your health care provider. Make sure you discuss any questions you have with your health care provider.

## 2014-03-12 ENCOUNTER — Encounter (HOSPITAL_COMMUNITY)
Admission: RE | Admit: 2014-03-12 | Discharge: 2014-03-12 | Disposition: A | Discharge status: Home / Self Care | Payer: Self-pay | Source: Ambulatory Visit | Attending: Gastroenterology | Admitting: Gastroenterology

## 2014-03-12 ENCOUNTER — Encounter (HOSPITAL_COMMUNITY): Payer: Self-pay

## 2014-03-12 DIAGNOSIS — Z01812 Encounter for preprocedural laboratory examination: Secondary | ICD-10-CM | POA: Insufficient documentation

## 2014-03-12 HISTORY — DX: Other specified postprocedural states: Z98.890

## 2014-03-12 HISTORY — DX: Pure hypercholesterolemia, unspecified: E78.00

## 2014-03-12 HISTORY — DX: Other specified postprocedural states: R11.2

## 2014-03-12 LAB — BASIC METABOLIC PANEL
Anion gap: 11 (ref 5–15)
BUN: 11 mg/dL (ref 6–23)
CALCIUM: 9.2 mg/dL (ref 8.4–10.5)
CHLORIDE: 103 meq/L (ref 96–112)
CO2: 26 mEq/L (ref 19–32)
Creatinine, Ser: 0.76 mg/dL (ref 0.50–1.10)
GFR calc Af Amer: >90 mL/min (ref >90)
Glucose, Bld: 87 mg/dL (ref 70–99)
Potassium: 4.7 mEq/L (ref 3.7–5.3)
Sodium: 140 mEq/L (ref 137–147)

## 2014-03-12 LAB — HEMOGLOBIN AND HEMATOCRIT, BLOOD
HEMATOCRIT: 35.6 % — AB (ref 36.0–46.0)
Hemoglobin: 11.3 g/dL — ABNORMAL LOW (ref 12.0–15.0)

## 2014-03-12 LAB — HCG, SERUM, QUALITATIVE: Preg, Serum: NEGATIVE

## 2014-03-12 NOTE — Pre-Procedure Instructions (Signed)
Patient given information to sign up for my chart at home. 

## 2014-03-18 ENCOUNTER — Ambulatory Visit (HOSPITAL_COMMUNITY): Payer: Self-pay | Admitting: Anesthesiology

## 2014-03-18 ENCOUNTER — Ambulatory Visit (HOSPITAL_COMMUNITY)
Admission: RE | Admit: 2014-03-18 | Discharge: 2014-03-18 | Disposition: A | Discharge status: Home / Self Care | Payer: Self-pay | Source: Ambulatory Visit | Attending: Gastroenterology | Admitting: Gastroenterology

## 2014-03-18 ENCOUNTER — Encounter (HOSPITAL_COMMUNITY)
Admission: RE | Disposition: A | Discharge status: Home / Self Care | Payer: Self-pay | Source: Ambulatory Visit | Attending: Gastroenterology

## 2014-03-18 DIAGNOSIS — B9681 Helicobacter pylori [H. pylori] as the cause of diseases classified elsewhere: Secondary | ICD-10-CM | POA: Insufficient documentation

## 2014-03-18 DIAGNOSIS — E780 Pure hypercholesterolemia: Secondary | ICD-10-CM | POA: Insufficient documentation

## 2014-03-18 DIAGNOSIS — K2970 Gastritis, unspecified, without bleeding: Secondary | ICD-10-CM

## 2014-03-18 DIAGNOSIS — K2960 Other gastritis without bleeding: Secondary | ICD-10-CM | POA: Insufficient documentation

## 2014-03-18 DIAGNOSIS — R1013 Epigastric pain: Secondary | ICD-10-CM | POA: Insufficient documentation

## 2014-03-18 DIAGNOSIS — K6389 Other specified diseases of intestine: Secondary | ICD-10-CM | POA: Insufficient documentation

## 2014-03-18 DIAGNOSIS — K219 Gastro-esophageal reflux disease without esophagitis: Secondary | ICD-10-CM | POA: Insufficient documentation

## 2014-03-18 HISTORY — PX: BIOPSY: SHX5522

## 2014-03-18 HISTORY — PX: ESOPHAGOGASTRODUODENOSCOPY (EGD) WITH PROPOFOL: SHX5813

## 2014-03-18 SURGERY — ESOPHAGOGASTRODUODENOSCOPY (EGD) WITH PROPOFOL
Anesthesia: Monitor Anesthesia Care | Site: Esophagus

## 2014-03-18 MED ORDER — FENTANYL CITRATE 0.05 MG/ML IJ SOLN: 25.0000 ug | INTRAMUSCULAR | Status: DC | PRN

## 2014-03-18 MED ORDER — SCOPOLAMINE 1 MG/3DAYS TD PT72
1.0000 | MEDICATED_PATCH | Freq: Once | TRANSDERMAL | Status: DC
  Administered 2014-03-18: 1.5 mg via TRANSDERMAL

## 2014-03-18 MED ORDER — ONDANSETRON HCL 4 MG/2ML IJ SOLN
4.0000 mg | Freq: Once | INTRAMUSCULAR | Status: AC
  Administered 2014-03-18: 4 mg via INTRAVENOUS

## 2014-03-18 MED ORDER — DEXAMETHASONE SODIUM PHOSPHATE 4 MG/ML IJ SOLN
4.0000 mg | Freq: Once | INTRAMUSCULAR | Status: AC
  Administered 2014-03-18: 4 mg via INTRAVENOUS

## 2014-03-18 MED ORDER — PROPOFOL 10 MG/ML IV EMUL
INTRAVENOUS | Status: AC
  Filled 2014-03-18: qty 20

## 2014-03-18 MED ORDER — MIDAZOLAM HCL 5 MG/5ML IJ SOLN
INTRAMUSCULAR | Status: DC | PRN
  Administered 2014-03-18: 2 mg via INTRAVENOUS

## 2014-03-18 MED ORDER — FENTANYL CITRATE 0.05 MG/ML IJ SOLN
INTRAMUSCULAR | Status: AC
  Filled 2014-03-18: qty 2

## 2014-03-18 MED ORDER — FENTANYL CITRATE 0.05 MG/ML IJ SOLN
25.0000 ug | INTRAMUSCULAR | Status: AC
Start: 2014-03-18 — End: 2014-03-18
  Administered 2014-03-18 (×2): 25 ug via INTRAVENOUS

## 2014-03-18 MED ORDER — MIDAZOLAM HCL 2 MG/2ML IJ SOLN
INTRAMUSCULAR | Status: AC
  Filled 2014-03-18: qty 2

## 2014-03-18 MED ORDER — SCOPOLAMINE 1 MG/3DAYS TD PT72
MEDICATED_PATCH | TRANSDERMAL | Status: AC
Start: 2014-03-18 — End: 2014-03-18
  Filled 2014-03-18: qty 1

## 2014-03-18 MED ORDER — ONDANSETRON HCL 4 MG/2ML IJ SOLN: 4.0000 mg | Freq: Once | INTRAMUSCULAR | Status: DC | PRN

## 2014-03-18 MED ORDER — LIDOCAINE VISCOUS 2 % MT SOLN
3.0000 mL | Freq: Once | OROMUCOSAL | Status: AC
  Administered 2014-03-18: 3 mL via OROMUCOSAL

## 2014-03-18 MED ORDER — STERILE WATER FOR IRRIGATION IR SOLN
Status: DC | PRN
  Administered 2014-03-18: 10:00:00

## 2014-03-18 MED ORDER — ONDANSETRON HCL 4 MG/2ML IJ SOLN
INTRAMUSCULAR | Status: AC
  Filled 2014-03-18: qty 2

## 2014-03-18 MED ORDER — LACTATED RINGERS IV SOLN
INTRAVENOUS | Status: DC
  Administered 2014-03-18: 1000 mL via INTRAVENOUS

## 2014-03-18 MED ORDER — DEXAMETHASONE SODIUM PHOSPHATE 4 MG/ML IJ SOLN
INTRAMUSCULAR | Status: AC
Start: 2014-03-18 — End: 2014-03-18
  Filled 2014-03-18: qty 1

## 2014-03-18 MED ORDER — OMEPRAZOLE 20 MG PO CPDR: DELAYED_RELEASE_CAPSULE | ORAL | Status: DC

## 2014-03-18 MED ORDER — MIDAZOLAM HCL 2 MG/2ML IJ SOLN
1.0000 mg | INTRAMUSCULAR | Status: DC | PRN
  Administered 2014-03-18 (×2): 1 mg via INTRAVENOUS

## 2014-03-18 MED ORDER — PROPOFOL INFUSION 10 MG/ML OPTIME
INTRAVENOUS | Status: DC | PRN
  Administered 2014-03-18: 75 ug/kg/min via INTRAVENOUS

## 2014-03-18 MED ORDER — LIDOCAINE VISCOUS 2 % MT SOLN
OROMUCOSAL | Status: AC
  Filled 2014-03-18: qty 15

## 2014-03-18 MED ORDER — SODIUM CHLORIDE 0.9 % IV SOLN: INTRAVENOUS | Status: DC

## 2014-03-18 MED ORDER — GLYCOPYRROLATE 0.2 MG/ML IJ SOLN
INTRAMUSCULAR | Status: AC
  Filled 2014-03-18: qty 1

## 2014-03-18 SURGICAL SUPPLY — 9 items
BLOCK BITE 60FR ADLT L/F BLUE (MISCELLANEOUS) ×2 IMPLANT
FLOOR PAD 36X40 (MISCELLANEOUS) ×2
FORCEPS BIOP RAD 4 LRG CAP 4 (CUTTING FORCEPS) ×2 IMPLANT
FORMALIN 10 PREFIL 20ML (MISCELLANEOUS) ×4 IMPLANT
KIT CLEAN ENDO COMPLIANCE (KITS) ×2 IMPLANT
MANIFOLD NEPTUNE II (INSTRUMENTS) ×2 IMPLANT
PAD FLOOR 36X40 (MISCELLANEOUS) ×1 IMPLANT
TUBING IRRIGATION ENDOGATOR (MISCELLANEOUS) ×2 IMPLANT
WATER STERILE IRR 1000ML POUR (IV SOLUTION) ×2 IMPLANT

## 2014-03-18 NOTE — Op Note (Signed)
[facility] [address] [place], [address]   ENDOSCOPY PROCEDURE REPORT  PATIENT: Joyce Roberts, Joyce Roberts  MR#: [id number] BIRTHDATE: December 13, 1970 , 43  yrs. old GENDER: female  ENDOSCOPIST: Barney Drain, MD REFERRED ID:[id number] [place], PA-C  PROCEDURE DATE: 03-21-14 PROCEDURE:   EGD w/ biopsy INDICATIONS:dyspepsia. MEDICATIONS: Monitored anesthesia care TOPICAL ANESTHETIC:   Viscous Xylocaine ASA CLASS:  DESCRIPTION OF PROCEDURE:     Physical exam was performed.  Informed consent was obtained from the patient after explaining the benefits, risks, and alternatives to the procedure.  The patient was connected to the monitor and placed in the left lateral position.  Continuous oxygen was provided by nasal cannula and IV medicine administered through an indwelling cannula.  After administration of sedation, the patients esophagus was intubated and the     endoscope was advanced under direct visualization to the second portion of the duodenum.  The scope was removed slowly by carefully examining the color, texture, anatomy, and integrity of the mucosa on the way out.  The patient was recovered in endoscopy and discharged home in satisfactory condition.   ESOPHAGUS: The mucosa of the esophagus appeared normal.   STOMACH: Mild non-erosive gastritis (inflammation) was found.  Multiple biopsies were performed using cold forceps.   DUODENUM: The duodenal mucosa showed no abnormalities in the bulb and 2nd part of the duodenum.  Cold forceps biopsies were taken in the bulb and second portion. COMPLICATIONS: There were no immediate complications.  ENDOSCOPIC IMPRESSION: 1.   DYSPEPSIA DUE TO GERD/GASTRITIS 2.   MILD Non-erosive gastritis  RECOMMENDATIONS: TAKE OMEPRAZOLE 30 MINUTES PRIOR TO MEALS TWICE DAILY FOR 3 MOS THEN ONCE DAILY FOR THE NEXT YEAR. AVOID TRIGGERS FOR GASTRITIS. FOLLOW A LOW FAT DIET. FOLLOW UP IN 4 MOS.  REPEAT  EXAM:   _______________________________ eSignedBarney Drain, MD March 21, 2014 7:39 PM     CPT CODES: ICD CODES:  The ICD and CPT codes recommended by this software are interpretations from the data that the clinical staff has captured with the software.  The verification of the translation of this report to the ICD and CPT codes and modifiers is the sole responsibility of the health care institution and practicing physician where this report was generated.  [place]. will not be held responsible for the validity of the ICD and CPT codes included on this report.  AMA assumes no liability for data contained or not contained herein. CPT is a Designer, television/film set of the [employer].
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
## 2014-03-18 NOTE — Progress Notes (Signed)
interpreter at side. Windle Guard

## 2014-03-18 NOTE — Anesthesia Postprocedure Evaluation (Signed)
  Anesthesia Post-op Note  Patient: Joyce Roberts  Procedure(s) Performed: Procedure(s) with comments: ESOPHAGOGASTRODUODENOSCOPY (EGD) WITH PROPOFOL (N/A) BIOPSY (N/A) - duodenal and gastric biopsies  Patient Location: PACU  Anesthesia Type:MAC  Level of Consciousness: awake, alert , oriented and patient cooperative  Airway and Oxygen Therapy: Patient Spontanous Breathing  Post-op Pain: none  Post-op Assessment: Post-op Vital signs reviewed, Patient's Cardiovascular Status Stable, Respiratory Function Stable, Patent Airway and No signs of Nausea or vomiting  Post-op Vital Signs: Reviewed and stable  Last Vitals:  Filed Vitals:   03/18/14 0930  BP: 118/75  Temp:   Resp: 23    Complications: No apparent anesthesia complications

## 2014-03-18 NOTE — Transfer of Care (Signed)
Immediate Anesthesia Transfer of Care Note  Patient: Joyce Roberts  Procedure(s) Performed: Procedure(s) with comments: ESOPHAGOGASTRODUODENOSCOPY (EGD) WITH PROPOFOL (N/A) BIOPSY (N/A) - duodenal and gastric biopsies  Patient Location: PACU  Anesthesia Type:MAC  Level of Consciousness: awake and patient cooperative  Airway & Oxygen Therapy: Patient Spontanous Breathing and Patient connected to face mask oxygen  Post-op Assessment: Report given to PACU RN, Post -op Vital signs reviewed and stable and Patient moving all extremities  Post vital signs: Reviewed and stable  Complications: No apparent anesthesia complications

## 2014-03-18 NOTE — Anesthesia Preprocedure Evaluation (Addendum)
Anesthesia Evaluation  Patient identified by MRN, date of birth, ID band Patient awake    Reviewed: Allergy & Precautions, H&P , NPO status , Patient's Chart, lab work & pertinent test results  History of Anesthesia Complications (+) PONV and history of anesthetic complications  Airway Mallampati: I  TM Distance: >3 FB Neck ROM: Full    Dental  (+) Teeth Intact   Pulmonary neg pulmonary ROS,  breath sounds clear to auscultation        Cardiovascular negative cardio ROS  Rhythm:Regular Rate:Normal     Neuro/Psych    GI/Hepatic Dyspepsia    Endo/Other    Renal/GU      Musculoskeletal   Abdominal   Peds  Hematology   Anesthesia Other Findings   Reproductive/Obstetrics                           Anesthesia Physical Anesthesia Plan  ASA: II  Anesthesia Plan: MAC   Post-op Pain Management:    Induction: Intravenous  Airway Management Planned: Simple Face Mask  Additional Equipment:   Intra-op Plan:   Post-operative Plan:   Informed Consent: I have reviewed the patients History and Physical, chart, labs and discussed the procedure including the risks, benefits and alternatives for the proposed anesthesia with the patient or authorized representative who has indicated his/her understanding and acceptance.     Plan Discussed with:   Anesthesia Plan Comments:         Anesthesia Quick Evaluation

## 2014-03-18 NOTE — Discharge Instructions (Signed)
You have gastritis. I biopsied your stomach  AND SMALL BOWEL.   TAKE OMEPRAZOLE 30 MINUTES PRIOR TO MEALS TWICE DAILY FOR 3 MOS THEN ONCE DAILY FOR THE NEXT YEAR.  AVOID TRIGGERS FOR GASTRITIS. SEE INFO BELOW.  FOLLOW A LOW FAT DIET. SEE INFO BELOW.  FOLLOW UP IN 4 MOS.  Esofagogastroduodenoscopa - Cuidados posteriores  (Esophagogastroduodenoscopy, Care After)  Siga estas instrucciones durante las prximas semanas. Estas indicaciones le proporcionan informacin general acerca de cmo deber cuidarse despus del procedimiento. El mdico tambin podr darle instrucciones ms especficas. El tratamiento ha sido planificado segn las prcticas mdicas actuales, pero en algunos casos pueden ocurrir complicaciones. Comunquese con el mdico si tiene algn problema o tiene preguntas despus del procedimiento.  INSTRUCCIONES PARA EL CUIDADO EN EL HOGAR  No coma ni beba nada hasta que el medicamento para adormecer (anestsico local) haya desaparecido y su reflejo nauseoso haya vuelto. Usted sabr que el anestsico local se ha eliminado cuando usted pueda tragar cmodamente.  No conduzca vehculos por 12 horas despus de finalizado el procedimiento o segn las indicaciones de su mdico.  Tome slo la medicacin que le indic el profesional. SOLICITE ATENCIN MDICA SI:  No puede dejar de toser.  No puede orinar u orina menos que lo habitual. SOLICITE ATENCIN MDICA DE INMEDIATO SI:  Tiene dificultad para tragar.  No puede comer o beber.  Siente cada vez ms dolor en la garganta o en el pecho.  Se siente mareado, tiene vahdos o se desmaya.  Tiene nuseas o vmitos.  Tiene escalofros.  Tiene fiebre.  Siente un dolor abdominal intenso.  La materia fecal es negra, de aspecto alquitranado o tiene [place].  Gastritis - Adultos  (Gastritis, Adult)    La gastrittis es la irritacin (inflamacin) de la membrana interna del estmago. Puede ser Ardelia Mems enfermedad de inicio sbito (aguda) o de largo  plazo (crnica). Si la gastritis no se trata, puede causar sangrado y lceras.  CAUSAS  La gastritis se produce cuando la membrana que tapiza interiormente al estmago se debilita o se daa. Los jugos digestivos del estmago inflaman el revestimiento del estmago debilitado. El revestimiento del estmago puede debilitarse o daarse por una infeccin viral o bacteriana. La infeccin bacteriana ms comn es la infeccin por Helicobacter pylori. Tambin puede ser el resultado del consumo excesivo de alcohol, por el uso de ciertos medicamentos o porque hay demasiado cido en el estmago.  SNTOMAS  En algunos casos no hay sntomas. Si se presentan sntomas, stos pueden ser:  Dolor o sensacin de ardor en la parte superior del abdomen.  Nuseas.  Vmitos.  Sensacin molesta de distensin despus de comer. DIAGNSTICO  El mdico puede diagnosticar gastritis segn los sntomas y el examen fsico. Para determinar la causa de la gastritis, el mdico podr:  Pedir anlisis de sangre o de materia fecal para diagnosticar la presencia de la bacteria H pylori.  Gastroscopa. Un tubo delgado y flexible (endoscopio) se pasa por Industrial/product designer al [employer]. El endoscopio tiene [place] luz y una cmara en el extremo. El mdico utilizar el endoscopio para observar el interior del [place].  Tomar una muestra de tejido (biopsia) del estmago para examinarlo en el microscopio. TRATAMIENTO  Segn la causa de la gastritis podrn recetarle: Antibiticos, si la causa es una infeccin bacteriana, como una infeccin por H. pylori. Anticidos o bloqueadores H2, si hay demasiado cido en el estmago. El [employer] aconsejar que deje de tomar aspirina, ibuprofeno u otros antiinflamatorios no esteroides (AINE).  INSTRUCCIONES PARA EL CUIDADO EN EL HOGAR  Tome slo medicamentos de venta libre o recetados, segn las indicaciones del mdico.  Si le han recetado antibiticos, tmelos segn las indicaciones. Tmelos todos,  aunque se sienta mejor.  Debe ingerir gran cantidad de lquido para mantener la orina de tono claro o color amarillo plido.  Evite las comidas y bebidas que empeoran los [place]:  [place] con cafena o alcohlicas.  Chocolate.  Sabores a English as a second language teacher.  Ajo y cebolla.  Comidas muy condimentadas.  Ctricos como naranjas, limones o limas.  Alimentos que contengan tomate, como salsas, [place] y pizza.  Alimentos fritos y Radio broadcast assistant. Haga comidas pequeas durante Psychiatrist de 3 comidas abundantes. SOLICITE ATENCIN MDICA DE INMEDIATO SI:  La materia fecal es negra o de color rojo oscuro.  Vomita sangre de color rojo brillante o material similar a granos de caf.  No puede retener los lquidos.  El dolor abdominal empeora.  Tiene fiebre.  No mejora luego de 1 semana.  Tiene preguntas o preocupaciones. ASEGRESE DE QUE:  Comprende estas instrucciones.  Controlar su enfermedad.  Solicitar ayuda de inmediato si no mejora o si empeora. Document Released: 12/22/2004 Document Revised: 12/07/2011  [facility] Patient Information 2015 [place]. This information is not intended to replace advice given to you by your health care provider. Make sure you discuss any questions you have with your health care provider.   Dieta para el control del colesterol y las grasas  (Fat and Cholesterol Control Diet)  Su dieta tiene un [employer] [place] de grasa y colesterol en su sangre y rganos. El exceso de [place] y colesterol en la sangre puede afectar su:  Corazn.  Vasos sanguneos (arterias, venas).  Vescula biliar.  Hgado.  Pncreas. CONTROL DE LA GRASA Y EL COLESTEROL CON LA DIETA  Ciertos alimentos pueden subir el colesterol y 71 pueden [place]. Es importante que reemplace las grasas malas por otros tipos de [place].  No consuma:  Carnes grasas, como las salchichas y [place].  Margarina en barra y Building services engineer en tubo que tienen "aceites parcialmente hidrogenados" en  ellos.  Productos horneados, como galletas dulces y saladas que tienen "aceites parcialmente hidrogenados" en ellos.  Los Walt Disney, como los aceites de coco y de [place].   Consuma los siguientes alimentos:  Cortes de peceto o lomo de carne roja.  Pollo (sin piel).  Pescado.  Ternera.  Carne de [employer].  Mariscos.  Frutas, como manzanas.  Verduras, como el brcoli, papas y zanahorias.  Frijoles, arvejas y lentejas (legumbres).  Cereales, como [place], arroz, cuscs y trigo bulgur.  Pastas (sin salsas de crema). Busque alimentos sin grasas, descremados y bajos en colesterol. Encontrar alimentos con fibra soluble y Dentist (fitosteroles). Usted debe comer 2 gramos al da de estos alimentos.  IDENTIFIQUE LOS ALIMENTOS BAJOS EN GRASAS Y COLESTEROL  Encuentre los alimentos con fibra soluble y esteroles vegetales (fitosteroles). Debe consumir 2 gramos al da de estos alimentos. Ellos son:  Lambert Mody.  Vegetales.  Granos enteros.  Frijoles y [employer].  Frutos secos y semillas. Sigel etiquetas del paquete. Busque los alimentos bajos en grasas saturadas, libres en grasas trans, y de bajo [place].  Elija quesos que contengan slo 2 a 3 gramos de grasa saturada por onza.  Use margarina que sea saludable para el corazn que sea [place] de grasas trans o aceites parcialmente hidrogenados. Evite comprar productos horneados que contengan aceites parcialmente hidrogenados. En su lugar, compre productos  horneados elaborados con cereales integrales (trigo integral o harina de avena integral). Evite los productos horneados en cuya etiqueta diga con "harina" o "harina enriquecida".  Compre sopas en lata que no sean cremosas, con bajo contenido de sal y sin grasas adicionadas. PREPARE SUS ALIMENTOS USTED MISMO  Cocine los alimentos hervidos, horneados, al vapor o asados. No fra los alimentos.  Utilice un spray antiadherente para cocinar.  Use limn o hierbas para  condimentar comidas en lugar de usar mantequilla o margarina.  Use yogur descremados, salsas o aderezos para ensaladas con bajo contenido de [place]. BAJO EN GRASAS SATURADAS / SUSTITUTOS BAJOS EN GRASA  Carnes / grasas saturadas (g)  Evite: Bife, veteado (3 oz/85 g) / 11 g  Elija: Bife, magro(3 oz/85 g) / 4 g Evite: [place]uesa (3 oz/85 g) / 7 g  Elija: [place], magra (3 oz/85 g) / 5 g Evite: Jamn (3 oz/85 g) / 6 g  Elija: Jamn, corte magro (3 oz/85 g) / 2,4 g Evite: Pollo con piel, carne oscura (3 oz/85 g) / 4 g  Elija: Pollo, sin piel, carne oscura (3 oz/85 g) / 2 g Evite: Pollo con piel, carne blanca (3 oz/85 g) / 2,5 g  Elija: Pollo, sin piel, carne blanca (3 oz/85 g) / 1 g Lcteos / Grasa saturada (g)  Evite: Leche entera (1 taza) / 5 g  Elija: Leche descremada, 2% (1 taza) / 3 g  Elija: Leche descremada, 1% (1 taza) / 1,5 g  Elija: Leche descremada, 1 taza / 0,3 g Evite: Queso duro (1 oz/28 g) / 6 g  Elija: Queso de [employer] (1 oz/28 g) / 2 a 3 g Evite: Queso cottage, 4% de grasa (1 taza) / 6,5 g  Elija: Queso cottage bajo en grasa, 1% de grasa (1 taza) / 1,5 g Evite: Helado (1 taza) / 9 g  Elija: Sorbete (1 taza) / 2,5 g  Elija: Yogur congelado descremado (1 taza) / 0,3 g  Elija: Barra de frutas congeladas / trace Evite: Crema batida (1 cucharada) / 3,5 g  Elija: Crema batida no lctea (1 cucharada) / 1 g Condimentos / Grasas Saturadas (g)  Evite: Mayonesa (1 cucharada) / 2 g  Elija: [place]ones[place] baja en grasa (1 cucharada) / 1 g Evite: Mantequilla (1 cucharada) / 7 g  Elija: Margarina light extra (1 cucharada) / 1 g Evite: Aceite de coco (1 cucharada) / 11,8 g  Elija: Aceite de oliva (1 cucharada) / 1,8 g  Elija: Aceite de maz (1 cucharada) / 1,7 g  Elija: Aceite de crtamo (1 cucharada) / 1,2 g  Elija: Aceite de girasol (1 cucharada) / 1,4 g  Elija: Aceite de soja (1 cucharada) / 2,4 g  Elija: Aceite de canola (1 cucharada) / 1 g Document Released:  09/13/2011 Document Revised: 07/09/2012  [employer] Patient Information 2015 [place], [place]. This information is not intended to replace advice given to you by your health care provider. Make sure you discuss any questions you have with your health care provider.   Document Released: 07/09/2012  [facility] Patient Information 2015 [place]. This information is not intended to replace advice given to you by your health care provider. Make sure you discuss any questions you have with your health care provider.

## 2014-03-19 ENCOUNTER — Encounter (HOSPITAL_COMMUNITY): Payer: Self-pay | Admitting: Gastroenterology

## 2014-04-17 ENCOUNTER — Telehealth: Payer: Self-pay | Admitting: Gastroenterology

## 2014-04-17 ENCOUNTER — Other Ambulatory Visit: Payer: Self-pay

## 2014-04-17 DIAGNOSIS — D582 Other hemoglobinopathies: Secondary | ICD-10-CM

## 2014-04-17 MED ORDER — AMOXICILLIN 500 MG PO TABS: ORAL_TABLET | ORAL | Status: DC

## 2014-04-17 MED ORDER — CLARITHROMYCIN 500 MG PO TABS: ORAL_TABLET | ORAL | Status: DC

## 2014-04-17 NOTE — Telephone Encounter (Signed)
PLEASE CALL PT.  Her stomach Bx showed H. Pylori infection. THIS IS THE REASON WHY SHE HAS NAUSEA VOMITING, AND OCCASIONALLY SEES BLOOD IN HER VOMIT. IT IS ALSO MAKING HER HAVE A BORDERLINE LOW BLOOD COUNT.  She needs AMOXICILLIN 500 mg 2 po BID for 10 days and Biaxin 500 mg po bid for 10 days. CONTINUE Omeprazole BID for 3 MOS THEN ONCE DAILY. Med side effects include NVD, abd pain, and metallic taste. OPV IN 4 MOS W/ AS E 30 H PYLORI GASTRITIS, DIARRHEA/CONSTIPATION  AND WE WILL RECHECK HER BLOOD COUNT 1 WEEK PRIOR TO HER VISIT.

## 2014-04-17 NOTE — Telephone Encounter (Signed)
PT is aware of results and how to take meds. Will call if there are problems.

## 2014-04-17 NOTE — Telephone Encounter (Signed)
Lab order on file for April labs.

## 2014-04-17 NOTE — Telephone Encounter (Signed)
PATIENT SCHEDULED FOR FOLLOW UP APPOINTMENT

## 2014-06-18 ENCOUNTER — Other Ambulatory Visit: Payer: Self-pay

## 2014-06-18 DIAGNOSIS — D582 Other hemoglobinopathies: Secondary | ICD-10-CM

## 2014-07-15 ENCOUNTER — Ambulatory Visit: Payer: Self-pay | Admitting: Gastroenterology

## 2014-07-15 ENCOUNTER — Telehealth: Payer: Self-pay | Admitting: Gastroenterology

## 2014-07-15 ENCOUNTER — Encounter: Payer: Self-pay | Admitting: Gastroenterology

## 2014-07-15 LAB — HEMATOCRIT: HCT: 34.8 % — ABNORMAL LOW (ref 36.0–46.0)

## 2014-07-15 LAB — HEMOGLOBIN: Hemoglobin: 10.5 g/dL — ABNORMAL LOW (ref 12.0–15.0)

## 2014-07-15 NOTE — Telephone Encounter (Signed)
PATIENT WAS A NO SHOW 07/12/14 AND LETTER WAS SEN T

## 2014-07-28 ENCOUNTER — Other Ambulatory Visit: Payer: Self-pay

## 2014-07-28 DIAGNOSIS — R109 Unspecified abdominal pain: Secondary | ICD-10-CM

## 2014-07-28 NOTE — Telephone Encounter (Signed)
Tried to call pt. VM not set up. Mailing a letter to call. Stool container and order at front for pick up.

## 2014-07-28 NOTE — Telephone Encounter (Signed)
PLEASE CALL PT. HER BLOOD COUNT IS STABLE BUT NOT NORMAL. SHE NEEDS A H PYLORI STOOL ANTIGEN. HOLD PPI FOR 2 weeks prior to test.

## 2014-07-30 ENCOUNTER — Other Ambulatory Visit: Payer: Self-pay

## 2014-07-30 DIAGNOSIS — D582 Other hemoglobinopathies: Secondary | ICD-10-CM

## 2014-07-30 NOTE — Telephone Encounter (Signed)
Pt also needs a cbc & ferritin.

## 2014-07-30 NOTE — Telephone Encounter (Signed)
Lab orders mailed to pt to do and to call for info about the stool antigen for H. Pylori.

## 2014-08-08 ENCOUNTER — Ambulatory Visit (INDEPENDENT_AMBULATORY_CARE_PROVIDER_SITE_OTHER): Payer: Self-pay | Admitting: Gastroenterology

## 2014-08-08 ENCOUNTER — Encounter: Payer: Self-pay | Admitting: Gastroenterology

## 2014-08-08 VITALS — BP 111/73 | HR 78 | Temp 98.4°F | Ht 60.0 in | Wt 135.2 lb

## 2014-08-08 DIAGNOSIS — R6881 Early satiety: Secondary | ICD-10-CM

## 2014-08-08 DIAGNOSIS — R1011 Right upper quadrant pain: Secondary | ICD-10-CM

## 2014-08-08 DIAGNOSIS — D649 Anemia, unspecified: Secondary | ICD-10-CM

## 2014-08-08 DIAGNOSIS — K589 Irritable bowel syndrome without diarrhea: Secondary | ICD-10-CM

## 2014-08-08 DIAGNOSIS — R1084 Generalized abdominal pain: Secondary | ICD-10-CM

## 2014-08-08 DIAGNOSIS — R1013 Epigastric pain: Secondary | ICD-10-CM

## 2014-08-08 MED ORDER — LUBIPROSTONE 8 MCG PO CAPS: 8.0000 ug | ORAL_CAPSULE | Freq: Two times a day (BID) | ORAL | Status: DC

## 2014-08-08 NOTE — Patient Instructions (Signed)
1. Please go by the free clinic and give them our orders for labs and stool test so that they can assist you with getting these done. 2. Please start Amitiza 8 g twice daily with food for constipation. We provided you samples. Please ask the free clinic if they can help you obtain this medication. If not let us know and we can see if he qualified for patient assistance with the drug company. 3. Return to the office in 8 weeks for follow-up. Call sooner if her symptoms worsen. 4. We will be in touch with you regarding your lab results when they're available. 5. Recommend you starting an over-the-counter iron supplement such as ferrous sulfate 325 mg twice daily.
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
## 2014-08-12 ENCOUNTER — Encounter: Payer: Self-pay | Admitting: Gastroenterology

## 2014-08-12 NOTE — Assessment & Plan Note (Signed)
44 yo Spanish-speaking female who presents for follow-up. She continues to have multiple GI complaints. She has chronic lower abdominal pain associated with alternating constipation and diarrhea. Predominant constipation however. Only occasional blood per rectum (colonoscopy up-to-date with internal hemorrhoids). Suspect IBS. Trial of Amitiza 8 g twice a day with food. She is uninsured. We will try to get her assistance through the free clinic or drug company. Samples provided today.  She also has postprandial upper abdominal tenderness with new component of sharp pain under the ribs for the past 1 month. Complains of early satiety. History of H. pylori gastritis in December 2015 status post treatment but no longer on PPI therapy. Denies typical heartburn. Gallbladder remains in situ. Recommend checking H. pylori stool antigen as previously discussed. Also update labs regarding her anemia will check LFTs to check for any biliary component. Based on findings he may require abdominal ultrasound or other further imaging.  Regarding anemia, possibly related to menstrual loss. Add ferrous sulfate 325 mg twice a day for now. Recheck labs.  For now, hold off PPI therapy. May need to resume in the future based on above workup.

## 2014-08-13 NOTE — Progress Notes (Signed)
No pcp per patient 

## 2014-09-22 ENCOUNTER — Encounter: Payer: Self-pay | Admitting: Obstetrics and Gynecology

## 2014-10-02 ENCOUNTER — Encounter: Payer: Self-pay | Admitting: Obstetrics and Gynecology

## 2014-10-08 ENCOUNTER — Encounter: Payer: Self-pay | Admitting: Gastroenterology

## 2014-10-08 ENCOUNTER — Ambulatory Visit (INDEPENDENT_AMBULATORY_CARE_PROVIDER_SITE_OTHER): Payer: Self-pay | Admitting: Gastroenterology

## 2014-10-08 VITALS — BP 115/72 | HR 69 | Temp 98.3°F | Ht 60.0 in | Wt 135.6 lb

## 2014-10-08 DIAGNOSIS — D509 Iron deficiency anemia, unspecified: Secondary | ICD-10-CM

## 2014-10-08 DIAGNOSIS — R6881 Early satiety: Secondary | ICD-10-CM

## 2014-10-08 DIAGNOSIS — G8929 Other chronic pain: Secondary | ICD-10-CM | POA: Insufficient documentation

## 2014-10-08 DIAGNOSIS — K5900 Constipation, unspecified: Secondary | ICD-10-CM

## 2014-10-08 DIAGNOSIS — R1013 Epigastric pain: Secondary | ICD-10-CM

## 2014-10-08 MED ORDER — PROMETHAZINE HCL 25 MG PO TABS: 25.0000 mg | ORAL_TABLET | Freq: Four times a day (QID) | ORAL | Status: DC | PRN

## 2014-10-08 MED ORDER — DEXLANSOPRAZOLE 60 MG PO CPDR: 60.0000 mg | DELAYED_RELEASE_CAPSULE | Freq: Every day | ORAL | Status: DC

## 2014-10-08 NOTE — Assessment & Plan Note (Signed)
Resume Amitiza 102mcg BID with food. Samples and patient assistance forms provided.
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
## 2014-10-08 NOTE — Patient Instructions (Addendum)
1. Still waiting to receive some of labs and stool test results from [facility]. We will follow up on that and let you know if additional labs are needed. 2. Start Dexilant 60mg  daily 30 minutes before breakfast for abdominal pain/gastritis. Samples provided and you should complete patient assistance forms and return to our office. 3. Start Amitiza 28mcg twice daily with food for constipation. Samples provided and you should complete patient assistance forms and return to our office.  4. Return to the office in 8 weeks for follow up.

## 2014-10-08 NOTE — Assessment & Plan Note (Signed)
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 EGD/colonoscopy up to date. H/O H.pylori, stool antigen performed but have to request records from the [facility]. Will review when available.   Encouraged patient to follow through with GYN referral once [facility] Assistance approved. [facility] is working on this with her.

## 2014-10-08 NOTE — Assessment & Plan Note (Signed)
Ongoing epigastric pain, early satiety. History of H. pylori gastritis last year. She reports completing omeprazole 20 mg twice a day, amoxicillin, Biaxin as prescribed. We have requested records from the [facility] regarding H. pylori stool antigen ordered 07/2014 to verify eradication. Symptoms could be secondary to ongoing H. pylori gastritis especially in light of ongoing vomiting with mild hematemesis, epigastric pain. She also consumes daily Advil and if encouraged her to cut back. Crucial that we start PPI therapy at this point. Patient reluctant previously. Samples of Dexilant 60 mg daily and patient assistance forms provided.  If H.pylori stool antigen negative and no improvement on PPI, then consider GB work up as next step.

## 2014-10-13 NOTE — Progress Notes (Signed)
NO PCP PER PATIENT °

## 2014-10-23 ENCOUNTER — Telehealth: Payer: Self-pay | Admitting: Gastroenterology

## 2014-10-23 NOTE — Telephone Encounter (Signed)
I called [facility] [phone]) and spoke to [place]. She said they treated her for H Pylori in 12/2012 with the same meds that we did in 2016. She said they have not done any more H pylori testing on pt.

## 2014-10-23 NOTE — Telephone Encounter (Signed)
I finally received copy of H.Pylori stool antigen test result from [facility]. Was positive.  Can we contact [facility] and make sure they did not retreat the patient.   We treated her in 03/2014 with Amoxicillin/Biaxin/omeprazole.     She also had labs from 07/2014 Iron 16, iron saturations 4%, TIBC 429, total bilirubin 0.2, alkaline phosphatase 59, AST 12, ALT 11, albumin 3.6.  Will follow up on labs after next OV.

## 2014-10-23 NOTE — Telephone Encounter (Signed)
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  May try Levoquin 250mg , Amoxicillin 1 gram and PPI BID for 14 days BUT let me discuss with Dr. Oneida Alar first.

## 2014-10-24 NOTE — Telephone Encounter (Signed)
Tried to call pt and VM not set up.  

## 2014-10-24 NOTE — Telephone Encounter (Signed)
REVIEWED. AGREE. NO ADDITIONAL RECOMMENDATIONS. 

## 2014-10-24 NOTE — Telephone Encounter (Signed)
Joyce Roberts, let's see if we can get patient started on the following regimen.  Levaquin 250mg  BID for 14 days. Amoxicillin 500mg  take TWO (1000mg ) po BID for 14 days.  Dexilant 60mg  BID for 14 days.  Continue Dexilant once daily after above regimen (already on this-may samples and patient should be filling out patient assistance forms)  When she comes back in 11/2014, we will discuss repeat H.pylori stool antigen then .

## 2014-10-27 NOTE — Telephone Encounter (Signed)
Mailing a letter to call.  

## 2014-10-27 NOTE — Telephone Encounter (Signed)
Tried to call pt. VM not set up.  

## 2014-11-04 MED ORDER — LEVOFLOXACIN 250 MG PO TABS: 250.0000 mg | ORAL_TABLET | Freq: Two times a day (BID) | ORAL | Status: DC

## 2014-11-04 MED ORDER — AMOXICILLIN 500 MG PO CAPS: 1000.0000 mg | ORAL_CAPSULE | Freq: Two times a day (BID) | ORAL | Status: DC

## 2014-11-04 NOTE — Telephone Encounter (Signed)
Samples of Dexilant 60 mg #30  Given to pt with the written instructions. PT's daughter Joyce Roberts was with her to tell her mom how to take the meds. Pt and daughter aware not to take the Dexilant that she has at home, while taking the samples given today. She only had a few at home she said.

## 2014-11-04 NOTE — Telephone Encounter (Signed)
Printed out prescriptions for patient.

## 2014-11-17 NOTE — Telephone Encounter (Signed)
Pt came by the office and said she could not afford the Levaquin ( $257.78 ). She had the Dexilant samples and she purchased the Amoxicillin. She is waiting to hear what recommendation in place of the Levaquin. Please advise!

## 2014-11-18 NOTE — Telephone Encounter (Signed)
Can we do the generic? Levofloxacin? Please call pharmacy to see. This is Leslie's patient. Routing for FYI and further recommendations.

## 2014-11-18 NOTE — Telephone Encounter (Signed)
The quote was for generic Levaquin. Pt does not have insurance.

## 2014-11-18 NOTE — Telephone Encounter (Signed)
Pt came by the office to see if we had an answer for her. I explained that Neil Crouch, PA was on a short day today. Hopefully I can let her know something tomorrow. She DOES NOT have drug insurance.  She also said that she gets a yeast infection when she takes antibiotics and will need Diflucan.

## 2014-11-19 NOTE — Telephone Encounter (Signed)
Tried to call pt and Vm not set up.  

## 2014-11-19 NOTE — Telephone Encounter (Signed)
Please let patient know that there are not many options and her options are very limited by her allergy to Tetracycline/Doxycycline and the fact she already failed amoxicillin/biaxin/omeprazole treatment.   Tell her to give me a couple of days. I will try to talk with Infectious Disease specialist to see if they can come up with another option.

## 2014-11-20 NOTE — Telephone Encounter (Signed)
Pt's daughter called and was informed that Magda Paganini will speak to Infectious Disease about recommendations for this pt.

## 2014-12-03 ENCOUNTER — Encounter: Payer: Self-pay | Admitting: Gastroenterology

## 2014-12-03 ENCOUNTER — Ambulatory Visit (INDEPENDENT_AMBULATORY_CARE_PROVIDER_SITE_OTHER): Payer: Self-pay | Admitting: Gastroenterology

## 2014-12-03 VITALS — BP 120/76 | HR 77 | Temp 97.6°F | Ht 60.0 in | Wt 136.8 lb

## 2014-12-03 DIAGNOSIS — K5900 Constipation, unspecified: Secondary | ICD-10-CM

## 2014-12-03 DIAGNOSIS — D509 Iron deficiency anemia, unspecified: Secondary | ICD-10-CM

## 2014-12-03 DIAGNOSIS — R1013 Epigastric pain: Secondary | ICD-10-CM

## 2014-12-03 DIAGNOSIS — B9681 Helicobacter pylori [H. pylori] as the cause of diseases classified elsewhere: Secondary | ICD-10-CM | POA: Insufficient documentation

## 2014-12-03 DIAGNOSIS — R6881 Early satiety: Secondary | ICD-10-CM

## 2014-12-03 DIAGNOSIS — K2970 Gastritis, unspecified, without bleeding: Secondary | ICD-10-CM

## 2014-12-03 MED ORDER — LUBIPROSTONE 24 MCG PO CAPS: 24.0000 ug | ORAL_CAPSULE | Freq: Two times a day (BID) | ORAL | Status: DC

## 2014-12-03 NOTE — Patient Instructions (Signed)
1. Stop Amitiza 52mcg. Start Amitiza 35mcg twice daily with a meal. If you develop diarrhea, you can back down to once daily dosing.  2. Restart Dexilant once daily. 3. We will try to get the medication to treat the stomach infection from the drug company.  4. Call if your symptoms worsen.  5. Please continue iron twice daily. 6. Limit ibuprofen use as this can contribute to stomach pain. 7. Please see gyn as soon as possible to manage your menstrual bleeding/gyn issues.

## 2014-12-03 NOTE — Assessment & Plan Note (Signed)
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 EGD and colonoscopy up-to-date. Need to get H. pylori eradicated. Trying to obtain Levaquin for the patient given her uninsured status. Continue iron therapy. She is waiting for [facility] assistance approval prior to making GYN appointment.

## 2014-12-03 NOTE — Progress Notes (Signed)
cc'ed to pcp °

## 2014-12-03 NOTE — Assessment & Plan Note (Signed)
Ongoing epigastric pain, early satiety. Discussed at length with the patient. She is quite tender on exam. Like to treat H pylori initially to see where her symptoms settle out however if she has progressive abdominal pain we may need to consider a CT in the interim. Patient will notify us if her symptoms are progressive. Otherwise try to obtain Levaquin for patient to be healed completely H. pylori therapy (Levaquin, amoxicillin, PPI twice a day). She will restart Dexilant at this time. Samples provided and patient assistance from drug co pending.

## 2014-12-03 NOTE — Progress Notes (Signed)
Primary Care Physician: Montey Hora  Primary Gastroenterologist:  Barney Drain, MD   Chief Complaint  Patient presents with  . Follow-up    HPI: Joyce Roberts is a 44 y.o. female Hispanic speaking female who presents with interpreter today. She is here for two-month follow-up of constipation, abdominal pain, H pylori. In May she had an H. pylori stool antigen which was positive. She was treated with Biaxin, amoxicillin, PPI in January 2016 and as well as in 2014. She is allergic to tetracycline/doxycycline causing hives. She was unable to afford Levaquin ($260) which is part of her H. pylori regimen for failure in the setting of tetracycline allergies. She is uninsured. She also is waiting approval for [facility] assistance. She needs to see GYN regarding heavy menses, history of uterine abnormality. She just completed patient assistance forms for Amitiza and Dexilant yesterday.  Amitiza 8 g twice a day has helped constipation some but she still goes a couple of days in between without a bowel movement. No melena. No bright red blood per rectum. Continues to have early satiety, no vomiting. Feels bloated with meals. Trying to limit Advil, typically uses during menstrual cycle. EGD and colonoscopy up-to-date, see past surgical history.  Current Outpatient Prescriptions  Medication Sig Dispense Refill  . lubiprostone (AMITIZA) 8 MCG capsule Take 1 capsule (8 mcg total) by mouth 2 (two) times daily with a meal. 60 capsule 3  . acetaminophen (TYLENOL) 500 MG tablet Take 1,000 mg by mouth every 6 (six) hours as needed for mild pain.    Marland Kitchen amoxicillin (AMOXIL) 500 MG capsule Take 2 capsules (1,000 mg total) by mouth 2 (two) times daily. (Patient not taking: Reported on 12/03/2014) 56 capsule 0  . cetirizine (ZYRTEC) 10 MG tablet Take 10 mg by mouth daily.    Marland Kitchen dexlansoprazole (DEXILANT) 60 MG capsule Take 1 capsule (60 mg total) by mouth daily. (Patient not taking: Reported on  12/03/2014) 30 capsule 0  . ferrous sulfate 325 (65 FE) MG tablet Take 325 mg by mouth 2 (two) times daily with a meal.    . ibuprofen (ADVIL,MOTRIN) 200 MG tablet Take 400 mg by mouth every 6 (six) hours as needed for moderate pain.    Marland Kitchen levofloxacin (LEVAQUIN) 250 MG tablet Take 1 tablet (250 mg total) by mouth 2 (two) times daily. (Patient not taking: Reported on 12/03/2014) 28 tablet 0  . promethazine (PHENERGAN) 25 MG tablet Take 1 tablet (25 mg total) by mouth every 6 (six) hours as needed for nausea or vomiting. (Patient not taking: Reported on 12/03/2014) 30 tablet 0   No current facility-administered medications for this visit.    Allergies as of 12/03/2014 - Review Complete 12/03/2014  Allergen Reaction Noted  . Prednisone Other (See Comments) 03/12/2014  . Robitussin (alcohol free) [guaifenesin]  12/24/2013  . Doxycycline Hives 08/17/2011  . Tetracyclines & related Hives 12/03/2013    ROS:  General: Negative for anorexia, weight loss, fever, chills, fatigue, weakness. ENT: Negative for hoarseness, difficulty swallowing , nasal congestion. CV: Negative for chest pain, angina, palpitations, dyspnea on exertion, peripheral edema.  Respiratory: Negative for dyspnea at rest, dyspnea on exertion, cough, sputum, wheezing.  GI: See history of present illness. GU:  Negative for dysuria, hematuria, urinary incontinence, urinary frequency, nocturnal urination.  Endo: Negative for unusual weight change.    Physical Examination:   BP 120/76 mmHg  Pulse 77  Temp(Src) 97.6 F (36.4 C) (Oral)  Ht 5' (1.524 m)  Wt 136 lb 12.8 oz (62.052 kg)  BMI 26.72 kg/m2  LMP 11/12/2014  General: Well-nourished, well-developed in no acute distress.  Eyes: No icterus. Mouth: Oropharyngeal mucosa moist and pink , no lesions erythema or exudate. Lungs: Clear to auscultation bilaterally.  Heart: Regular rate and rhythm, no murmurs rubs or gallops.  Abdomen: Bowel sounds are normal, moderate epigastric  tenderness with fullness but no discrete mass, nondistended, no hepatosplenomegaly or masses, no abdominal bruits or hernia , no rebound or guarding.   Extremities: No lower extremity edema. No clubbing or deformities. Neuro: Alert and oriented x 4   Skin: Warm and dry, no jaundice.   Psych: Alert and cooperative, normal mood and affect.     Imaging Studies: No results found.

## 2014-12-03 NOTE — Assessment & Plan Note (Signed)
Increase Amitiza 24 g twice a day. We'll update patient assistance forms for this dosage. Discussed with staff.

## 2014-12-04 NOTE — Telephone Encounter (Signed)
Patient seen in office. We are trying to get a supply of Levaquin from [place]. [facility] is assisting.

## 2014-12-25 NOTE — Telephone Encounter (Signed)
Joyce Roberts, what is status of supply of Levaquin.

## 2015-01-02 NOTE — Telephone Encounter (Signed)
I have tried multiple times to get in touch with the [facility]. No answer from them. I have faxed them a request for help with this, with no answer. Unable to find a rep for this. I did find a coupon and have given this to [facility].

## 2015-01-07 NOTE — Telephone Encounter (Signed)
Agree with trying to use coupon for generic levaquin BUT IT HAS TO BE FOR 250MG  TABLETS.  Levaquin generic 250 mg by mouth twice a day for 14 days, 28, 0 refills Amoxicillin 500 mg take 2 (1000 mg) by mouth twice a day for 14 days, 56, 0 refills PATIENT MAY HAVE THIS AT [place] 60mg  BID for 14 days and then once daily thereafter. Samples as needed. She should fill out patient assistance if she has not already.

## 2015-01-08 ENCOUNTER — Telehealth: Payer: Self-pay | Admitting: Gastroenterology

## 2015-01-08 ENCOUNTER — Ambulatory Visit: Payer: Self-pay | Admitting: Physician Assistant

## 2015-01-08 ENCOUNTER — Other Ambulatory Visit: Payer: Self-pay | Admitting: Gastroenterology

## 2015-01-08 VITALS — BP 116/68 | HR 88 | Temp 97.9°F | Ht 59.5 in | Wt 137.4 lb

## 2015-01-08 DIAGNOSIS — N920 Excessive and frequent menstruation with regular cycle: Secondary | ICD-10-CM

## 2015-01-08 DIAGNOSIS — N841 Polyp of cervix uteri: Secondary | ICD-10-CM

## 2015-01-08 DIAGNOSIS — B9681 Helicobacter pylori [H. pylori] as the cause of diseases classified elsewhere: Secondary | ICD-10-CM

## 2015-01-08 DIAGNOSIS — R350 Frequency of micturition: Secondary | ICD-10-CM

## 2015-01-08 DIAGNOSIS — K2970 Gastritis, unspecified, without bleeding: Secondary | ICD-10-CM

## 2015-01-08 LAB — POCT URINALYSIS DIPSTICK
Bilirubin, UA: NEGATIVE
Glucose, UA: NEGATIVE
KETONES UA: NEGATIVE
Leukocytes, UA: NEGATIVE
Nitrite, UA: NEGATIVE
PH UA: 5.5
Urobilinogen, UA: 0.2

## 2015-01-08 MED ORDER — LEVOFLOXACIN 250 MG PO TABS: 250.0000 mg | ORAL_TABLET | Freq: Two times a day (BID) | ORAL | Status: DC

## 2015-01-08 MED ORDER — FLUCONAZOLE 150 MG PO TABS: 150.0000 mg | ORAL_TABLET | Freq: Once | ORAL | Status: DC

## 2015-01-08 NOTE — Progress Notes (Signed)
BP 116/68 mmHg  Pulse 88  Temp(Src) 97.9 F (36.6 C)  Ht 4' 11.5" (1.511 m)  Wt 137 lb 6.4 oz (62.324 kg)  BMI 27.30 kg/m2  SpO2 98%   Subjective:    Patient ID: Joyce Roberts, female    DOB: 05/31/1970, 44 y.o.   MRN: [id number]  HPI: Joyce Roberts is a 44 y.o. female presenting on 01/08/2015 for GI Problem; Urinary Frequency; and Referral   HPI   Chief Complaint  Patient presents with  . GI Problem    pt went to GI last month and was told she had H-pylori, pt is unable to afford meds to treat.  . Urinary Frequency    pt states she has been urinating more frequently, has discomfort after she urinates, urine has a strong smell. pt states some times after she urinates and wipes she sees a little blood.. pt c/o lower L back pain  . Referral    pt not yet seen by gyn    Pt says she has turned in her [facility] discount in May and doesn't know if she was approved.    She is seeing GI for her GI issues.  We will contact GI office to get info on this med to see if we might be able to get her a coupon  Pt still needs to see gyn for her heavy menses and cervical polyp.   mammo ordered 11/25/14. It is still pending  C/o urine burning and frequency for about a month  Relevant past medical, surgical, family and social history reviewed and updated as indicated. Interim medical history since our last visit reviewed. Allergies and medications reviewed and updated.   Current outpatient prescriptions:  .  Cetirizine HCl (ZYRTEC PO), Take by mouth daily., Disp: , Rfl:  .  ferrous sulfate 325 (65 FE) MG tablet, Take 325 mg by mouth 2 (two) times daily with a meal., Disp: , Rfl:  .  ibuprofen (ADVIL,MOTRIN) 200 MG tablet, Take 400 mg by mouth every 6 (six) hours as needed for moderate pain., Disp: , Rfl:  .  lubiprostone (AMITIZA) 24 MCG capsule, Take 1 capsule (24 mcg total) by mouth 2 (two) times daily with a meal., Disp: 20 capsule, Rfl: 0 .  dexlansoprazole (DEXILANT) 60 MG  capsule, Take 1 capsule (60 mg total) by mouth daily. (Patient not taking: Reported on 01/08/2015), Disp: 30 capsule, Rfl: 0 .  fluconazole (DIFLUCAN) 150 MG tablet, Take 1 tablet (150 mg total) by mouth once. Tome una tableta por boca una ves, Disp: 1 tablet, Rfl: 0 .  levofloxacin (LEVAQUIN) 250 MG tablet, Take 1 tablet (250 mg total) by mouth 2 (two) times daily. For 14 days, with amoxicillin and Dexilant for h.pylori, Disp: 28 tablet, Rfl: 0   Review of Systems  Constitutional: Positive for fever, appetite change and fatigue. Negative for chills, diaphoresis and unexpected weight change.  HENT: Positive for congestion, dental problem, hearing loss, sneezing, sore throat and trouble swallowing. Negative for drooling, ear pain, facial swelling, mouth sores and voice change.   Eyes: Positive for itching. Negative for pain, discharge, redness and visual disturbance.  Respiratory: Positive for cough. Negative for choking, shortness of breath and wheezing.   Cardiovascular: Positive for leg swelling. Negative for chest pain and palpitations.  Gastrointestinal: Positive for vomiting, abdominal pain, constipation and blood in stool. Negative for diarrhea.  Endocrine: Negative for cold intolerance, heat intolerance and polydipsia.  Genitourinary: Positive for dysuria and hematuria. Negative for decreased urine  volume.  Musculoskeletal: Positive for back pain, arthralgias and gait problem.  Skin: Negative for rash.  Allergic/Immunologic: Positive for environmental allergies.  Neurological: Positive for headaches. Negative for seizures, syncope and light-headedness.  Hematological: Negative for adenopathy.  Psychiatric/Behavioral: Positive for agitation. Negative for suicidal ideas and dysphoric mood. The patient is not nervous/anxious.     Per HPI unless specifically indicated above     Objective:    BP 116/68 mmHg  Pulse 88  Temp(Src) 97.9 F (36.6 C)  Ht 4' 11.5" (1.511 m)  Wt 137 lb 6.4  oz (62.324 kg)  BMI 27.30 kg/m2  SpO2 98%  Wt Readings from Last 3 Encounters:  01/08/15 137 lb 6.4 oz (62.324 kg)  12/03/14 136 lb 12.8 oz (62.052 kg)  10/08/14 135 lb 9.6 oz (61.508 kg)    Physical Exam  Constitutional: She is oriented to person, place, and time. She appears well-developed and well-nourished.  HENT:  Head: Normocephalic and atraumatic.  Neck: Neck supple.  Cardiovascular: Normal rate and regular rhythm.   Pulmonary/Chest: Effort normal and breath sounds normal.  Abdominal: Soft. Bowel sounds are normal. She exhibits no distension and no mass. There is tenderness (RUQ tender. remainder abd nontender). There is no rebound and no guarding.  Musculoskeletal: She exhibits no edema.  Lymphadenopathy:    She has no cervical adenopathy.  Neurological: She is alert and oriented to person, place, and time.  Skin: Skin is warm and dry.  Psychiatric: She has a normal mood and affect. Her behavior is normal.  Vitals reviewed.    Results for orders placed or performed in visit on 01/08/15  POCT urinalysis dipstick  Result Value Ref Range   Color, UA yellow    Clarity, UA clear    Glucose, UA N    Bilirubin, UA N    Ketones, UA N    Spec Grav, UA >=1.030    Blood, UA TRACE - INTACT    pH, UA 5.5    Protein, UA TRACE    Urobilinogen, UA 0.2    Nitrite, UA N    Leukocytes, UA Negative Negative      Assessment & Plan:   Encounter Diagnoses  Name Primary?  . Urinary frequency Yes  . Helicobacter pylori gastritis   . Excessive or frequent menstruation   . Polyp of cervix      pt is given coupon for levaquin ($20 at [facility]).  She is told to go to GI office to get prescription and she says she has the other 2 meds she is supposed to take with the levaquin  (I rec she confirm with GI b/c I do not know what they want to give her)  The meds from GI for her H Pylori will solve her UTI so no additional antibiotics are given today.  She is given diflucan for yeast  infection that she is worried she will get when she takes her antibiotic    We will contact ashley to check o n her [facility] app so she can get to gyn (for heavy menses and cervical polyp)  Her mammo has already been ordered but not scheduled  F/u 50mo. rto sooner prn

## 2015-01-08 NOTE — Telephone Encounter (Signed)
I spoke with Joyce Roberts and she is aware of the Levaquin dosage and instructions. They are trying to help the pt get that. She is aware we have a coupon if that will help.

## 2015-01-08 NOTE — Telephone Encounter (Signed)
Pt and daughter came by the office. They had a coupon for the Levaquin 250 and also I gave them a card that could be used at [employer]. We gave the printed Rx for the Levaquin.   Pt and daughter said she had picked up the Amoxicillin that had previously been written and did not take it then, was waiting on the Levaquin.  Samples of the Dexilant 60 mg given # 30 for bid.   Also, a copy of the written instructions given to them.  They just want to know when pt should have a follow up appt here.  Routing to Neil Crouch, [place], to advise!

## 2015-01-08 NOTE — Telephone Encounter (Signed)
See addendums to 10/23/2014 note.

## 2015-01-08 NOTE — Telephone Encounter (Signed)
Christy from the [facility] called to ask what the dosage was for the Levaquin. Patient had seen [facility] on 9/7 and has no insurance. [facility] is trying to help her get assistance and didn't know what the dose was for the Levaquin. Please call Alyse Low or Oris Drone at the [facility] [phone]

## 2015-01-11 DIAGNOSIS — N920 Excessive and frequent menstruation with regular cycle: Secondary | ICD-10-CM | POA: Insufficient documentation

## 2015-01-11 DIAGNOSIS — N841 Polyp of cervix uteri: Secondary | ICD-10-CM | POA: Insufficient documentation

## 2015-01-13 NOTE — Telephone Encounter (Signed)
Stacy, please schedule this appt and let pt know. Thanks!

## 2015-01-13 NOTE — Telephone Encounter (Signed)
Return OV in two months with [facility], she has not seen in office any time recently.

## 2015-01-14 ENCOUNTER — Encounter: Payer: Self-pay | Admitting: Gastroenterology

## 2015-01-14 NOTE — Telephone Encounter (Signed)
PATIENT APPT MADE AND LETTER SENT, ALSO SPOKE TO PATIENT ON PHONE

## 2015-01-28 ENCOUNTER — Other Ambulatory Visit (HOSPITAL_COMMUNITY): Payer: Self-pay | Admitting: Nurse Practitioner

## 2015-01-28 DIAGNOSIS — N920 Excessive and frequent menstruation with regular cycle: Secondary | ICD-10-CM

## 2015-02-02 ENCOUNTER — Other Ambulatory Visit (HOSPITAL_COMMUNITY): Payer: Self-pay

## 2015-02-02 ENCOUNTER — Ambulatory Visit (HOSPITAL_COMMUNITY)
Admission: RE | Admit: 2015-02-02 | Discharge: 2015-02-02 | Disposition: A | Discharge status: Home / Self Care | Payer: Self-pay | Source: Ambulatory Visit | Attending: Nurse Practitioner | Admitting: Nurse Practitioner

## 2015-02-02 DIAGNOSIS — N920 Excessive and frequent menstruation with regular cycle: Secondary | ICD-10-CM | POA: Insufficient documentation

## 2015-02-02 DIAGNOSIS — N83202 Unspecified ovarian cyst, left side: Secondary | ICD-10-CM | POA: Insufficient documentation

## 2015-02-02 DIAGNOSIS — R102 Pelvic and perineal pain: Secondary | ICD-10-CM | POA: Insufficient documentation

## 2015-02-02 DIAGNOSIS — D259 Leiomyoma of uterus, unspecified: Secondary | ICD-10-CM | POA: Insufficient documentation

## 2015-02-10 ENCOUNTER — Other Ambulatory Visit: Payer: Self-pay | Admitting: Physician Assistant

## 2015-02-10 DIAGNOSIS — N644 Mastodynia: Secondary | ICD-10-CM

## 2015-02-25 ENCOUNTER — Encounter (INDEPENDENT_AMBULATORY_CARE_PROVIDER_SITE_OTHER): Payer: Self-pay | Admitting: Gastroenterology
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
## 2015-03-11 ENCOUNTER — Encounter: Payer: Self-pay | Admitting: Gastroenterology

## 2015-03-11 ENCOUNTER — Ambulatory Visit (INDEPENDENT_AMBULATORY_CARE_PROVIDER_SITE_OTHER): Payer: Self-pay | Admitting: Gastroenterology

## 2015-03-11 VITALS — BP 122/78 | HR 90 | Temp 97.6°F | Ht 60.0 in | Wt 134.6 lb

## 2015-03-11 DIAGNOSIS — D509 Iron deficiency anemia, unspecified: Secondary | ICD-10-CM

## 2015-03-11 DIAGNOSIS — B9681 Helicobacter pylori [H. pylori] as the cause of diseases classified elsewhere: Secondary | ICD-10-CM

## 2015-03-11 DIAGNOSIS — K2970 Gastritis, unspecified, without bleeding: Secondary | ICD-10-CM

## 2015-03-11 DIAGNOSIS — R1013 Epigastric pain: Secondary | ICD-10-CM

## 2015-03-11 MED ORDER — OMEPRAZOLE 20 MG PO CPDR: DELAYED_RELEASE_CAPSULE | ORAL | Status: DC

## 2015-03-11 NOTE — Assessment & Plan Note (Signed)
COMPLETED ABX.   SUBMIT STOOL TO CHECK FOR H PYLORI. AFTER SUBMITTING STOOL STUDIES, START OMEPRAZOLE DAILY. FOLLOW UP IN 4 MOS.

## 2015-03-11 NOTE — Assessment & Plan Note (Addendum)
DUE TO CONSTIPATION AND/R OR GASTRITIS +/- H PYLORI GASTRITIS  Stop AMITIZA. START LINZESS. TAKE 30 MINS PRIOR TO FIRST MEAL. DRINK WATER TO KEEP YOUR URINE LIGHT YELLOW. FOLLOW A HIGH FIBER DIET. AVOID ITEMS THAT CAUSE BLOATING & GAS.  GET CBC CHECKED. SUBMIT STOOL TO CHECK FOR H PYLORI. AFTER SUBMITTING STOOL STUDIES, START OMEPRAZOLE DAILY. FOLLOW UP IN 4 MOS.

## 2015-03-11 NOTE — Progress Notes (Signed)
ON RECALL  °

## 2015-03-11 NOTE — Progress Notes (Signed)
REVIEWED-NO ADDITIONAL RECOMMENDATIONS. 

## 2015-03-11 NOTE — Patient Instructions (Signed)
Stop AMITIZA. START LINZESS. TAKE 30 MINS PRIOR TO FIRST MEAL.  DRINK WATER TO KEEP YOUR URINE LIGHT YELLOW.  FOLLOW A HIGH FIBER DIET. AVOID ITEMS THAT CAUSE BLOATING & GAS. SEE INFO BELOW.  GET YOUR BLOOD COUNT CHECKED.  SUBMIT STOOL TO CHECK FOR H PYLORI.  AFTER SUBMITTING STOOL STUDIES, START OMEPRAZOLE DAILY.  FOLLOW UP IN 4 MOS.

## 2015-03-11 NOTE — Assessment & Plan Note (Signed)
NO BRBPR OR MELENA.  CBC TODAY 
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
## 2015-03-12 NOTE — Progress Notes (Signed)
CC'ED TO PCP 

## 2015-04-13 ENCOUNTER — Ambulatory Visit: Payer: Self-pay | Admitting: Physician Assistant

## 2015-04-13 ENCOUNTER — Encounter: Payer: Self-pay | Admitting: Physician Assistant

## 2015-04-13 VITALS — BP 114/66 | HR 80 | Temp 98.2°F | Ht 60.0 in | Wt 134.2 lb

## 2015-04-13 DIAGNOSIS — N920 Excessive and frequent menstruation with regular cycle: Secondary | ICD-10-CM

## 2015-04-13 DIAGNOSIS — R1013 Epigastric pain: Secondary | ICD-10-CM

## 2015-04-13 DIAGNOSIS — N841 Polyp of cervix uteri: Secondary | ICD-10-CM

## 2015-04-13 DIAGNOSIS — K2970 Gastritis, unspecified, without bleeding: Principal | ICD-10-CM

## 2015-04-13 DIAGNOSIS — B9681 Helicobacter pylori [H. pylori] as the cause of diseases classified elsewhere: Secondary | ICD-10-CM

## 2015-04-13 NOTE — Progress Notes (Signed)
BP 114/66 mmHg  Pulse 80  Temp(Src) 98.2 F (36.8 C)  Ht 5' (1.524 m)  Wt 134 lb 4 oz (60.895 kg)  BMI 26.22 kg/m2  SpO2 99%   Subjective:    Patient ID: Joyce Roberts, female    DOB: 1970/03/29, 45 y.o.   MRN: [id number]  HPI: Joyce Roberts is a 45 y.o. female presenting on 04/13/2015 for Follow-up   HPI Pt has still not been to gyn yet- previously referred for abnormal and heavy menses as well as a polyp on her cervix.  Pt continues to see GI for her abdominal pain.  She has labwork that needs to be done.  Relevant past medical, surgical, family and social history reviewed and updated as indicated. Interim medical history since our last visit reviewed. Allergies and medications reviewed and updated.   Current outpatient prescriptions:  .  ibuprofen (ADVIL,MOTRIN) 200 MG tablet, Take 400 mg by mouth every 6 (six) hours as needed for moderate pain. Reported on 03/11/2015, Disp: , Rfl:  .  ferrous sulfate 325 (65 FE) MG tablet, Take 325 mg by mouth 2 (two) times daily with a meal. Reported on 04/13/2015, Disp: , Rfl:  .  omeprazole (PRILOSEC) 20 MG capsule, 1 PO EVERY MORNING (Patient not taking: Reported on 04/13/2015), Disp: 31 capsule, Rfl: 11   Review of Systems  Constitutional: Positive for fatigue. Negative for fever, chills, diaphoresis, appetite change and unexpected weight change.  HENT: Positive for congestion, dental problem and sore throat. Negative for drooling, ear pain, facial swelling, hearing loss, mouth sores, sneezing, trouble swallowing and voice change.   Eyes: Negative for pain, discharge, redness, itching and visual disturbance.  Respiratory: Negative for cough, choking, shortness of breath and wheezing.   Cardiovascular: Negative for chest pain, palpitations and leg swelling.  Gastrointestinal: Positive for abdominal pain and constipation. Negative for vomiting, diarrhea and blood in stool.  Endocrine: Negative for cold intolerance, heat intolerance  and polydipsia.  Genitourinary: Negative for dysuria, hematuria and decreased urine volume.  Musculoskeletal: Positive for back pain. Negative for arthralgias and gait problem.  Skin: Negative for rash.  Allergic/Immunologic: Positive for environmental allergies.  Neurological: Negative for seizures, syncope, light-headedness and headaches.  Hematological: Negative for adenopathy.  Psychiatric/Behavioral: Negative for suicidal ideas, dysphoric mood and agitation. The patient is not nervous/anxious.     Per HPI unless specifically indicated above     Objective:    BP 114/66 mmHg  Pulse 80  Temp(Src) 98.2 F (36.8 C)  Ht 5' (1.524 m)  Wt 134 lb 4 oz (60.895 kg)  BMI 26.22 kg/m2  SpO2 99%  Wt Readings from Last 3 Encounters:  04/13/15 134 lb 4 oz (60.895 kg)  03/11/15 134 lb 9.6 oz (61.054 kg)  01/08/15 137 lb 6.4 oz (62.324 kg)    Physical Exam  Constitutional: She is oriented to person, place, and time. She appears well-developed and well-nourished.  HENT:  Head: Normocephalic and atraumatic.  Neck: Neck supple.  Cardiovascular: Normal rate and regular rhythm.   Pulmonary/Chest: Effort normal and breath sounds normal.  Abdominal: Soft. Bowel sounds are normal. She exhibits no distension and no mass. There is tenderness (mild diffuse tenderness). There is no rebound and no guarding.  Musculoskeletal: She exhibits no edema.  Lymphadenopathy:    She has no cervical adenopathy.  Neurological: She is alert and oriented to person, place, and time.  Skin: Skin is warm and dry.  Psychiatric: She has a normal mood and affect. Her behavior  is normal.  Vitals reviewed.       Assessment & Plan:   Encounter Diagnoses  Name Primary?  . Helicobacter pylori gastritis Yes  . Abdominal pain, epigastric   . Excessive or frequent menstruation   . Polyp of cervix      -pt to continue with GI for evaluation and treatment of abdominal pain -check on referral to gyn for abnormal and  heavy menses -F/u 3 mo.  RTO sooner prn

## 2015-04-14 LAB — CBC WITH DIFFERENTIAL/PLATELET
BASOS PCT: 1 % (ref 0–1)
Basophils Absolute: 0.1 10*3/uL (ref 0.0–0.1)
EOS ABS: 0.1 10*3/uL (ref 0.0–0.7)
Eosinophils Relative: 1 % (ref 0–5)
HCT: 37.2 % (ref 36.0–46.0)
Hemoglobin: 11.8 g/dL — ABNORMAL LOW (ref 12.0–15.0)
Lymphocytes Relative: 32 % (ref 12–46)
Lymphs Abs: 1.9 10*3/uL (ref 0.7–4.0)
MCH: 26.2 pg (ref 26.0–34.0)
MCHC: 31.7 g/dL (ref 30.0–36.0)
MCV: 82.7 fL (ref 78.0–100.0)
MONOS PCT: 6 % (ref 3–12)
MPV: 9.5 fL (ref 8.6–12.4)
Monocytes Absolute: 0.3 10*3/uL (ref 0.1–1.0)
NEUTROS PCT: 60 % (ref 43–77)
Neutro Abs: 3.5 10*3/uL (ref 1.7–7.7)
PLATELETS: 282 10*3/uL (ref 150–400)
RBC: 4.5 MIL/uL (ref 3.87–5.11)
RDW: 13.6 % (ref 11.5–15.5)
WBC: 5.8 10*3/uL (ref 4.0–10.5)

## 2015-04-17 LAB — HELICOBACTER PYLORI  SPECIAL ANTIGEN: H. PYLORI Antigen: DETECTED

## 2015-05-06 NOTE — Progress Notes (Signed)
Quick Note:  Tried to call pt and VM not set up. ______

## 2015-05-06 NOTE — Progress Notes (Signed)
Quick Note:  Pt is aware and her appt is on 05/27/2015 at 2:00 Pm with Neil Crouch, PA. ______

## 2015-05-27 ENCOUNTER — Encounter: Payer: Self-pay | Admitting: Gastroenterology

## 2015-05-27 ENCOUNTER — Ambulatory Visit (INDEPENDENT_AMBULATORY_CARE_PROVIDER_SITE_OTHER): Payer: Self-pay | Admitting: Gastroenterology

## 2015-05-27 VITALS — BP 116/78 | HR 82 | Temp 98.2°F | Ht 60.0 in | Wt 134.2 lb

## 2015-05-27 DIAGNOSIS — R102 Pelvic and perineal pain: Secondary | ICD-10-CM

## 2015-05-27 DIAGNOSIS — B9681 Helicobacter pylori [H. pylori] as the cause of diseases classified elsewhere: Secondary | ICD-10-CM

## 2015-05-27 DIAGNOSIS — R1013 Epigastric pain: Secondary | ICD-10-CM

## 2015-05-27 DIAGNOSIS — K5900 Constipation, unspecified: Secondary | ICD-10-CM

## 2015-05-27 DIAGNOSIS — K2970 Gastritis, unspecified, without bleeding: Principal | ICD-10-CM

## 2015-05-27 NOTE — Progress Notes (Addendum)
      Primary Care Physician: Soyla Dryer, PA-C  Primary Gastroenterologist:  Barney Drain, MD   Chief Complaint  Patient presents with  . Follow-up  . get results   Interpreter: Geanie Logan  HPI: Joyce Roberts is a 45 y.o. female here for follow up of refractory H.pylori. She has completed triple drug therapy twice. First regimen included amoxicillin, biaxin, and omeprazole BID. Second regimen consisted of amoxicillin, levaquin, and Dexilant BID. She has h/o itching and hives related to doxycycline and tetracycline. After second treatment she felt better and she was hopeful that H. pylori had been eradicated but shortly after completing therapy her symptoms came back. H pylori stool antigen was positive again back in January. Hurts with eating and without eating. Recent N/V/D for three days, ?viral. Stools very dark. No peptobismol. Burning sensation in the epigastic area. Pp bloating and fullness.Not on iron. Rare advil due to stomach. Occasional tylenol.   Underwent pelvic ultrasound back in November 2016 for heavy menses, pelvic pain. She has multiple uterine fibroids, right ovary surgically absent, left ovary with simple appearing cyst. She is waiting to see the gynecologist what she is approved for [facility] financial assistance, application is pending approval.  Patient states she is not taking iron.  Current Outpatient Prescriptions  Medication Sig Dispense Refill  . ferrous sulfate 325 (65 FE) MG tablet Take 325 mg by mouth 2 (two) times daily with a meal. Reported on 04/13/2015    . omeprazole (PRILOSEC) 20 MG capsule 1 PO EVERY MORNING 31 capsule 11   No current facility-administered medications for this visit.    Allergies as of 05/27/2015 - Review Complete 05/27/2015  Allergen Reaction Noted  . Prednisone Other (See Comments) 03/12/2014  . Robitussin (alcohol free) [guaifenesin]  12/24/2013  . Dexilant [dexlansoprazole]  03/11/2015  . Doxycycline Hives 08/17/2011  .  Tetracyclines & related Hives 12/03/2013    ROS:  General: Negative for anorexia, weight loss, fever, chills, fatigue, weakness. ENT: Negative for hoarseness, difficulty swallowing , nasal congestion. CV: Negative for chest pain, angina, palpitations, dyspnea on exertion, peripheral edema.  Respiratory: Negative for dyspnea at rest, dyspnea on exertion, cough, sputum, wheezing.  GI: See history of present illness. GU:  Negative for dysuria, hematuria, urinary incontinence, urinary frequency, nocturnal urination.  Endo: Negative for unusual weight change.    Physical Examination:   BP 116/78 mmHg  Pulse 82  Temp(Src) 98.2 F (36.8 C)  Ht 5' (1.524 m)  Wt 134 lb 3.2 oz (60.873 kg)  BMI 26.21 kg/m2  LMP 05/11/2015  General: Well-nourished, well-developed in no acute distress.  Eyes: No icterus. Mouth: Oropharyngeal mucosa moist and pink , no lesions erythema or exudate. Lungs: Clear to auscultation bilaterally.  Heart: Regular rate and rhythm, no murmurs rubs or gallops.  Abdomen: Bowel sounds are normal, mild to moderate low abd tenderness to palpation, mild epigastric tenderness, nondistended, no hepatosplenomegaly or masses, no abdominal bruits or hernia , no rebound or guarding.   Extremities: No lower extremity edema. No clubbing or deformities. Neuro: Alert and oriented x 4   Skin: Warm and dry, no jaundice.   Psych: Alert and cooperative, normal mood and affect.  Labs:  No results found for: ALT, AST, GGT, ALKPHOS, BILITOT  Lab Results  Component Value Date   WBC 5.8 04/13/2015   HGB 11.8* 04/13/2015   HCT 37.2 04/13/2015   MCV 82.7 04/13/2015   PLT 282 04/13/2015   Imaging Studies: No results found.

## 2015-05-27 NOTE — Patient Instructions (Signed)
1. We will be in touch in a few days with further instructions. If there is not another antibiotic regimen to treat the bacteria in your stomach, we may need to repeat upper endoscopy and do resistance testing.

## 2015-05-28 DIAGNOSIS — R102 Pelvic and perineal pain: Secondary | ICD-10-CM | POA: Insufficient documentation

## 2015-05-28 NOTE — Assessment & Plan Note (Signed)
H/o fibroids. Due for gyn evaluation, patient reports plans for appointment once [facility] approval.

## 2015-05-28 NOTE — Assessment & Plan Note (Signed)
Doing well without medication at this time.

## 2015-05-28 NOTE — Progress Notes (Signed)
Can we find out if we will be able to get the patient Rifabutin 300mg  daily for 14 days? I have a feeling it will be very expensive.

## 2015-05-28 NOTE — Assessment & Plan Note (Signed)
Has failed two regimens now. She is allergic to tetracycline/doxycycline.   She may be candidate for Rifabutin 150mg  BID, Amoxicillin 1 gram BID, Bismuth subsalicylate BID, PPI BID for 14 days. I will discuss with Dr. Oneida Alar. Other option is resistance testing which would require repeat EGD with tissue sampling. Patient remains uninsured which will make obtaining expensive medications very difficult. Further recommendations to follow.

## 2015-05-28 NOTE — Progress Notes (Signed)
cc'ed to pcp °

## 2015-06-03 MED ORDER — AMOXICILLIN 500 MG PO CAPS: 1000.0000 mg | ORAL_CAPSULE | Freq: Two times a day (BID) | ORAL | Status: DC

## 2015-06-03 MED ORDER — RIFABUTIN 150 MG PO CAPS: 300.0000 mg | ORAL_CAPSULE | Freq: Every day | ORAL | Status: DC

## 2015-06-03 NOTE — Addendum Note (Signed)
Addended by: Mahala Menghini on: 06/03/2015 12:16 PM   Modules accepted: Orders

## 2015-06-03 NOTE — Progress Notes (Addendum)
Please let patient know (you need to use interpretor), we will try 3rd time to eradicate H.pylori with the following regimen based on ACG guidelines.  I want to first see if she can obtain the following medications. If she can, I want her to come to office with an interpretor for short visit to make sure she understands how to take the medications and bring medications with her. If this fails, we will have to do EGD and resistance testing.   Any PPI BID for 14 days. (we can give samples if needed). Patient is uninsured. Rifabutin 300mg  PO daily for 14 days. RX sent. Please let patient know, this drug can cause RASH, DIARRHEA, NAUSEA & VOMITING AND WILL TURN YOUR TEARS, AND URINE ORANGE.  Amoxicillin 1 gram PO BID for 14 days. RX sent.

## 2015-06-03 NOTE — Progress Notes (Signed)
Pt's daughter, [name], called and said they will check on the cost of the prescriptions. She will call me back tomorrow and let me know if they could afford. She is aware that her mom needs appt here prior to beginning the medications.

## 2015-06-03 NOTE — Progress Notes (Signed)
Tried to call pt and VM not set up.  

## 2015-06-04 NOTE — Progress Notes (Signed)
Joyce Roberts, I called the pharmacist, [name] and she said Amoxicillinis ready and it is $7.47. The Ribabutin is $450.00 and they have it on HOLD. The pt's daughter was going to check on the meds yesterday and call back and let us know if she could pick them up. Just wanted to give you an update.

## 2015-06-16 ENCOUNTER — Encounter: Payer: Self-pay | Admitting: Gastroenterology

## 2015-06-18 ENCOUNTER — Telehealth: Payer: Self-pay

## 2015-06-18 NOTE — Telephone Encounter (Signed)
Pt and her daughter came by the office to see what next step would be since she cannot afford the medication for the H pylori. I spoke with Neil Crouch, PA who said that she would need to speak to Dr. Oneida Alar about a plan of care. They are aware we will call them but it might be first of next week before I call her.  Pt said she is not doing any worse, everything is about the same.

## 2015-06-24 NOTE — Telephone Encounter (Signed)
PT is aware Magda Paganini will advise what to do.

## 2015-07-03 NOTE — Telephone Encounter (Addendum)
Patient's treatment is complicated by lack of medical insurance. Unfortunately next line of H.pylori treatment which includes rifabutin she cannot afford.  She is allergic to tetracycline (hives) therefore choices limited as well.   Next best option based on current guidelines for salvage treatments is as follows: Omeprazole 40mg  PO three times daily (30 minutes before breakfast, lunch, supper) for 14 days.  Amoxicillin 1000mg  (two 500mg  capsules) PO three times daily for 14 days. #84 no refills  We can provide her with prilosec OTC samples (take two at one time) three times daily for 14 days.  Please call in RX for amoxicillin.   Needs OV with [facility]F only in 2 months for follow up and H.pylori eradication testing at that time.

## 2015-07-06 NOTE — Telephone Encounter (Signed)
I called pt and she speaks very little [place]. She did understand to have the daughter call me so we can give her the plan of care from Neil Crouch, [place].   I have the samples of the Prilosec ready for her. I will not call the Amoxicillin in until I speak with daughter, so pt does not start it without the Prilosec.

## 2015-07-07 NOTE — Telephone Encounter (Signed)
Pt and daughter picked up the samples of the Prilosec. I called the Rx for the Amoxicillin to [place] [facility] in [place].  Daughter expressed understanding of how to take the medications and she will call if they have questions.

## 2015-07-07 NOTE — Telephone Encounter (Signed)
Tried to call and Vm not set up. Mailing a letter for pt's daughter to call.

## 2015-07-07 NOTE — Telephone Encounter (Signed)
Pt's daughter, [name] is aware and they will be by to pick up the Prilosec samples.

## 2015-07-13 ENCOUNTER — Ambulatory Visit: Payer: Self-pay | Admitting: Physician Assistant

## 2015-07-13 ENCOUNTER — Encounter: Payer: Self-pay | Admitting: Physician Assistant

## 2015-07-13 VITALS — BP 100/62 | HR 87 | Temp 98.1°F | Ht 60.0 in | Wt 138.2 lb

## 2015-07-13 DIAGNOSIS — N920 Excessive and frequent menstruation with regular cycle: Secondary | ICD-10-CM

## 2015-07-13 DIAGNOSIS — K2970 Gastritis, unspecified, without bleeding: Secondary | ICD-10-CM

## 2015-07-13 DIAGNOSIS — B9681 Helicobacter pylori [H. pylori] as the cause of diseases classified elsewhere: Secondary | ICD-10-CM

## 2015-07-13 DIAGNOSIS — D649 Anemia, unspecified: Secondary | ICD-10-CM

## 2015-07-13 NOTE — Progress Notes (Signed)
   BP 100/62 mmHg  Pulse 87  Temp(Src) 98.1 F (36.7 C)  Ht 5' (1.524 m)  Wt 138 lb 3.2 oz (62.687 kg)  BMI 26.99 kg/m2  SpO2 99%   Subjective:    Patient ID: Joyce Roberts, female    DOB: 03-30-1970, 45 y.o.   MRN: [id number]  HPI: Joyce Roberts is a 45 y.o. female presenting on 07/13/2015 for Follow-up   HPI   -Pt has been seeing GI.  Got rx for very expensive medication -She has appt with gyn tomorrow for excessive menses.    Relevant past medical, surgical, family and social history reviewed and updated as indicated. Interim medical history since our last visit reviewed. Allergies and medications reviewed and updated.  Current meds: none  Review of Systems  Constitutional: Positive for fatigue. Negative for fever, chills, appetite change and unexpected weight change.  HENT: Positive for congestion, dental problem and sneezing. Negative for drooling, facial swelling, hearing loss, mouth sores, trouble swallowing and voice change.   Eyes: Positive for itching. Negative for discharge, redness and visual disturbance.  Respiratory: Negative for choking, shortness of breath and wheezing.   Cardiovascular: Negative for chest pain and palpitations.  Gastrointestinal: Positive for abdominal pain and constipation. Negative for vomiting, diarrhea and blood in stool.  Endocrine: Negative for cold intolerance, heat intolerance and polydipsia.  Genitourinary: Negative for hematuria and decreased urine volume.  Musculoskeletal: Positive for back pain. Negative for arthralgias and gait problem.  Skin: Negative for rash.  Allergic/Immunologic: Positive for environmental allergies.  Neurological: Negative for seizures, syncope and light-headedness.  Hematological: Negative for adenopathy.  Psychiatric/Behavioral: Negative for suicidal ideas, dysphoric mood and agitation. The patient is not nervous/anxious.     Per HPI unless specifically indicated above     Objective:    BP  100/62 mmHg  Pulse 87  Temp(Src) 98.1 F (36.7 C)  Ht 5' (1.524 m)  Wt 138 lb 3.2 oz (62.687 kg)  BMI 26.99 kg/m2  SpO2 99%  Wt Readings from Last 3 Encounters:  07/13/15 138 lb 3.2 oz (62.687 kg)  05/27/15 134 lb 3.2 oz (60.873 kg)  04/13/15 134 lb 4 oz (60.895 kg)    Physical Exam  Constitutional: She is oriented to person, place, and time. She appears well-developed and well-nourished.  HENT:  Head: Normocephalic and atraumatic.  Neck: Neck supple.  Cardiovascular: Normal rate and regular rhythm.   Pulmonary/Chest: Effort normal and breath sounds normal.  Abdominal: Soft. Bowel sounds are normal. She exhibits no mass. There is no hepatosplenomegaly. There is no tenderness.  Musculoskeletal: She exhibits no edema.  Lymphadenopathy:    She has no cervical adenopathy.  Neurological: She is alert and oriented to person, place, and time.  Skin: Skin is warm and dry.  Psychiatric: She has a normal mood and affect. Her behavior is normal.  Vitals reviewed.       Assessment & Plan:   Encounter Diagnoses  Name Primary?  Marland Kitchen Anemia, unspecified anemia type Yes  . Helicobacter pylori gastritis   . Excessive or frequent menstruation     -pt counseled to Get back on iron due to hx anemia (off it for 3 months)( -Pt states she had mammo in feb in [place]- record request sent -Check h/h- will call with results -Pt given Pt Assistance Paperwork for GI medication to take to their office -F/u 3 months. RTO sooner prn

## 2015-07-14 ENCOUNTER — Ambulatory Visit (INDEPENDENT_AMBULATORY_CARE_PROVIDER_SITE_OTHER): Payer: Self-pay | Admitting: Adult Health

## 2015-07-14 ENCOUNTER — Encounter: Payer: Self-pay | Admitting: Adult Health

## 2015-07-14 VITALS — BP 98/70 | HR 76 | Ht 60.0 in | Wt 138.0 lb

## 2015-07-14 DIAGNOSIS — N941 Unspecified dyspareunia: Secondary | ICD-10-CM

## 2015-07-14 DIAGNOSIS — D219 Benign neoplasm of connective and other soft tissue, unspecified: Secondary | ICD-10-CM

## 2015-07-14 DIAGNOSIS — N852 Hypertrophy of uterus: Secondary | ICD-10-CM

## 2015-07-14 DIAGNOSIS — N888 Other specified noninflammatory disorders of cervix uteri: Secondary | ICD-10-CM

## 2015-07-14 DIAGNOSIS — N921 Excessive and frequent menstruation with irregular cycle: Secondary | ICD-10-CM

## 2015-07-14 DIAGNOSIS — R102 Pelvic and perineal pain: Secondary | ICD-10-CM

## 2015-07-14 DIAGNOSIS — D259 Leiomyoma of uterus, unspecified: Secondary | ICD-10-CM

## 2015-07-14 DIAGNOSIS — R1909 Other intra-abdominal and pelvic swelling, mass and lump: Secondary | ICD-10-CM

## 2015-07-14 HISTORY — DX: Hypertrophy of uterus: N85.2

## 2015-07-14 HISTORY — DX: Benign neoplasm of connective and other soft tissue, unspecified: D21.9

## 2015-07-14 HISTORY — DX: Excessive and frequent menstruation with irregular cycle: N92.1

## 2015-07-14 HISTORY — DX: Unspecified dyspareunia: N94.10

## 2015-07-14 HISTORY — DX: Other specified noninflammatory disorders of cervix uteri: N88.8

## 2015-07-14 LAB — HEMOGLOBIN: Hemoglobin: 11.8 g/dL (ref 11.7–15.5)

## 2015-07-14 LAB — HEMATOCRIT: HEMATOCRIT: 37 % (ref 35.0–45.0)

## 2015-07-14 NOTE — Patient Instructions (Signed)
Get [place] 4/21 at 3 pm at [facility] Return in 6 days to see Dr Waldon Merl (Menorrhagia) Se llama menorragia a los perodos menstruales abundantes o que duran ms de lo habitual. En la menorragia, la prdida de [place] y los clicos en cada perodo pueden hacerle imposible seguir con sus actividades habituales. CAUSAS  En algunos casos, la causa de los perodos abundantes es desconocida, pero hay algunas afecciones que pueden causar Psychologist, educational. Las causas ms frecuentes son:  Un problema con la tiroides, que es la glndula productora de hormonas (hipotiroidismo).  Formaciones no cancerosas en el tero (plipos o fibromas).  Un desequilibrio entre las hormonas estrgeno y Immunologist.  Uno de sus ovarios no libera vulos durante uno o ms meses.  Efectos secundarios por haberse colocado un dispositivo intrauterino (DIU).  Efectos secundarios por algunos medicamentos, como antiinflamatorios o anticoagulantes.  Trastornos hemorrgicos que impiden la Transport planner. SIGNOS Y SNTOMAS  Durante un perodo normal, el sangrado dura entre 4 y 84 das. Los signos de que el perodo es muy abundante son:  [place] rutinaria tiene que cambiar el apsito o el tampn cada 1 o 2 horas debido a que est completamente empapado.  Elimina cogulos ms grandes de 1 pulgada (2,5 cm).  Tiene sangrado durante ms de 7 das.  Necesita usar apsitos y tampones al mismo tiempo porque pierde [employer].  Debe levantarse para cambiarse el apsito o el tampn durante la noche.  Tiene sntomas de anemia como cansancio, fatiga o falta de aire. DIAGNSTICO  El [employer] har un examen fsico y le har preguntas sobre sus sntomas y su historia menstrual. Podr indicarle otros estudios segn lo que encuentre Social worker. Estos estudios pueden ser:  Anlisis de [place]. Los [employer] de sangre se usan para verificar si est embarazada o tiene cambios hormonales, un trastorno tiroideo o de  sangrado, niveles bajos de hierro (anemia) u otros problemas.  Biopsia de endometrio. El mdico tomar [place] de tejido del interior del tero para que sea examinado con un microscopio.  Ecografa plvica. Este estudio [place] ondas de sonido para tomar imgenes del tero, los ovarios y Geneticist, molecular. Las imgenes pueden mostrar si tiene fibromas u otros crecimientos.  Histeroscopa. Para este estudio, el mdico usar un pequeo telescopio para Chemical engineer interior del tero. Segn los [employer] Corporation estudios iniciales, el mdico podr indicar ms [employer]. TRATAMIENTO  Puede ser que no sea necesario un tratamiento mdico. Si lo necesita, el mdico primero podr recomendarle un tratamiento con uno o ms medicamentos. Si no se reduce el sangrado lo suficiente, el tratamiento quirrgico podra ser [employer]. El mejor tratamiento para usted depender de:   Si necesita evitar un embarazo.  Si desea tener hijos en el futuro.  La causa y la gravedad del sangrado.  Su opinin o preferencia personal. Algunos medicamentos para la menorragia son:  Mtodos anticonceptivos que contengan hormonas. Estos incluyen la pldora anticonceptiva, el parche en la piel, el anillo vaginal, las inyecciones que se aplican cada 3 meses, el DIU hormonal y el implante. Estos tratamientos reducen el sangrado durante el perodo menstrual.  Medicamentos que espesan la sangre y hacen ms lento el sangrado.  Medicamentos que reducen la inflamacin, como el ibuprofeno.  Medicamentos que contienen una hormona sinttica llamada progestina.  Medicamentos que [employer] American ovarios dejen de funcionar durante un breve lapso. Podra ser necesario un tratamiento quirrgico para la menorragia si los medicamentos no son eficaces. [place]:  Dilatacin y curetaje (D y C). En este procedimiento, el mdico abre (dilata) el cuello del tero y luego raspa o succiona tejido del revestimiento interior del  tero para reducir el sangrado menstrual.  Histeroscopa quirrgica. En este procedimiento, se utiliza un pequeo tubo con Hali Marry (histeroscopio) para observar la cavidad uterina y ayudar en la extirpacin quirrgica de un plipo que puede ser la causa de perodos abundantes.  Ablacin del endometrio. Por medio de Johnson [employer], el mdico destruye de [place] todo el revestimiento interno del tero (endometrio). Luego de la ablacin del endometrio, la mayora de las mujeres tienen escaso flujo menstrual, o no lo tienen. La ablacin del endometrio reduce la posibilidad de quedar embarazada.  Reseccin del endometrio. En este procedimiento quirrgico, se [place] un asa de Environmental manager para extirpar el revestimiento interno del tero. Este procedimiento tambin reduce la posibilidad de [place].  Histerectoma. La remocin [place] del tero y el cuello del tero es un procedimiento permanente que detiene los perodos [place]. El embarazo no es posible luego de Physicist, medical. Este procedimiento requiere de anestesia y hospitalizacin. [place] solo medicamentos de venta libre o recetados, segn las indicaciones del [place] todos los medicamentos recetados exactamente como se le indic. No cambie ni reemplace los medicamentos sin consultarlo con el mdico.  Tome los comprimidos de hierro recetados, Scientist, water quality segn las indicaciones del mdico. Las hemorragias de larga duracin pueden traer como consecuencia una disminucin en los niveles de hierro. Los comprimidos de hierro ayudan a Camera operator hierro que el organismo pierde luego de un sangrado abundante. El hierro puede causarle estreimiento. Si esto es un problema, aumente el consumo de [place], frutas y [place].  No tome aspirina ni medicamentos que contengan aspirina desde 1 semana antes ni durante el perodo menstrual. La aspirina puede hacer que la  hemorragia empeore.  Si necesita cambiar el apsito o el tampn ms de una vez cada 2horas, Nature conservation officer en cama y descanse todo lo posible hasta que la hemorragia se detenga.  Siga una dieta balanceada. Consuma alimentos ricos en hierro. Por ejemplo, vegetales de Boeing, carne, hgado, huevos y panes y Actor de grano entero. No trate de perder peso hasta que la hemorragia anormal se detenga y los niveles de hierro en la sangre vuelvan a la normalidad. SOLICITE ATENCIN MDICA SI:   Empapa un tampn o un apsito cada 1 o 2 horas, y [employer] Corporation ocurre cada vez que tiene el perodo.  Necesita usar apsitos y tampones al mismo tiempo porque pierde [employer].  Debe cambiarse el apsito o el tampn durante la noche.  Tiene un perodo que dura ms de 8 das.  Elimina cogulos de ms de 1 pulgada (2,5 cm).  Tiene perodos irregulares que ocurren ms o menos de una vez al mes.  Se siente mareada o se desmaya.  Se siente muy dbil o cansada.  Le falta el aire o siente que el corazn late muy rpido al hacer [place].  Tiene nuseas y vmitos o diarrea mientras toma los medicamentos.  Tiene algn problema que puede estar relacionado con el medicamento que est tomando. SOLICITE ATENCIN MDICA DE INMEDIATO SI:   Empapa 4 o ms apsitos o tampones en 2 horas.  Tiene sangrado y est embarazada. ASEGRESE DE QUE:   Comprende estas instrucciones.  Controlar su afeccin.  Recibir ayuda de inmediato si no mejora o si empeora.   Esta informacin no tiene Marine scientist  el consejo del mdico. Asegrese de hacerle al mdico cualquier pregunta que tenga.   Document Released: 12/22/2004 Document Revised: 03/19/2013 Elsevier Interactive Patient Education [employer]. Uterine Fibroids Uterine fibroids are tissue masses (tumors) that can develop in the womb (uterus). They are also called leiomyomas. This type of tumor is not cancerous (benign) and does not spread to  other parts of the body outside of the pelvic area, which is between the hip bones. Occasionally, fibroids may develop in the fallopian tubes, in the cervix, or on the support structures (ligaments) that surround the uterus. You can have one or many fibroids. Fibroids can vary in size, weight, and where they grow in the uterus. Some can become quite large. Most fibroids do not require medical treatment. CAUSES A fibroid can develop when a single uterine cell keeps growing (replicating). Most cells in the human body have a control mechanism that keeps them from replicating without control. SIGNS AND SYMPTOMS Symptoms may include:   Heavy bleeding during your period.  Bleeding or spotting between periods.  Pelvic pain and pressure.  Bladder problems, such as needing to urinate more often (urinary frequency) or urgently.  Inability to reproduce offspring (infertility).  Miscarriages. DIAGNOSIS Uterine fibroids are diagnosed through a physical exam. Your health care provider may feel the lumpy tumors during a pelvic exam. Ultrasonography and an MRI may be done to determine the size, location, and number of fibroids. TREATMENT Treatment may include:  Watchful waiting. This involves getting the fibroid checked by your health care provider to see if it grows or shrinks. Follow your health care provider's recommendations for how often to have this checked.  Hormone medicines. These can be taken by mouth or given through an intrauterine device (IUD).  Surgery.  Removing the fibroids (myomectomy) or the uterus (hysterectomy).  Removing blood supply to the fibroids (uterine artery embolization). If fibroids interfere with your fertility and you want to become pregnant, your health care provider may recommend having the fibroids removed.  HOME CARE INSTRUCTIONS  Keep all follow-up visits as directed by your health care provider. This is important.  Take medicines only as directed by your  health care provider.  If you were prescribed a hormone treatment, take the hormone medicines exactly as directed.  Do not take aspirin, because it can cause bleeding.  Ask your health care provider about taking iron pills and increasing the amount of dark green, leafy vegetables in your diet. These actions can help to boost your blood iron levels, which may be affected by heavy menstrual bleeding.  Pay close attention to your period and tell your health care provider about any changes, such as:  Increased blood flow that requires you to use more pads or tampons than usual per month.  A change in the number of days that your period lasts per month.  A change in symptoms that are associated with your period, such as abdominal cramping or back pain. SEEK MEDICAL CARE IF:  You have pelvic pain, back pain, or abdominal cramps that cannot be controlled with medicines.  You have an increase in bleeding between and during periods.  You soak tampons or pads in a half hour or less.  You feel lightheaded, extra tired, or weak. SEEK IMMEDIATE MEDICAL CARE IF:  You faint.  You have a sudden increase in pelvic pain.   This information is not intended to replace advice given to you by your health care provider. Make sure you discuss any questions you have with  your health care provider.   Document Released: 03/11/2000 Document Revised: 04/04/2014 Document Reviewed: 09/10/2013 Elsevier Interactive Patient Education [employer].

## 2015-07-14 NOTE — Progress Notes (Signed)
Subjective:     Patient ID: Joyce Roberts, female   DOB: Dec 23, 1970, 45 y.o.   MRN: [id number]  HPI Joyce Roberts is a 45 year old Hispanic female, in with her daughter, complaining of frequent periods and first 3 days heavy, changes pads every 1-2 hours and has pain, also has pain with sex, so does not have it any more.Has urinary frequency and constipation too.She has had one ovary and fibroid removed in past and it helped the urinary problems and constipation then, that was years ago.She sees Dr [name] office and [employer], she has [employer]. She is not sure of last pap.  Review of Systems Patient denies any headaches, hearing loss, fatigue, blurred vision, shortness of breath, chest pain. No joint pain or mood swings.See HPI for positives. Reviewed past medical,surgical, social and family history. Reviewed medications and allergies.     Objective:   Physical Exam BP 98/70 mmHg  Pulse 76  Ht 5' (1.524 m)  Wt 138 lb (62.596 kg)  BMI 26.95 kg/m2  LMP 06/19/2015 Skin warm and dry. Neck: mid line trachea, normal thyroid, good ROM, no lymphadenopathy noted. Lungs: clear to ausculation bilaterally. Cardiovascular: regular rate and rhythm.Abdomen soft and non tender, no HSM noted, Pelvic: external genitalia is normal in appearance no lesions, vagina: pink with good moisture and rugae,urethra has no lesions or masses noted, cervix is  Bulbous and has mass anterior lip, uterus: posterior and enlarged 10-12 week size, and mildly tender, no masses felt, adnexa: no masses + tenderness noted. Bladder is non tender and no masses felt. Dr Glo Herring in for co exam, will get [place] at [facility] to assess uterus.   Face time 30 minutes.  Assessment:     Menorrhagia Fibroids Pelvic pain Mass of cervix Dyspareunia Enlarged uterus     Plan:      Scheduled pelvic US for 4/21 at 3 pm at [facility] Return in 1 week to see Dr Glo Herring to discuss options Review handout on fibroids and menorrhagia

## 2015-07-17 ENCOUNTER — Ambulatory Visit (HOSPITAL_COMMUNITY)
Admission: RE | Admit: 2015-07-17 | Discharge: 2015-07-17 | Disposition: A | Discharge status: Home / Self Care | Payer: Self-pay | Source: Ambulatory Visit | Attending: Adult Health | Admitting: Adult Health

## 2015-07-17 ENCOUNTER — Ambulatory Visit (HOSPITAL_COMMUNITY): Payer: Self-pay

## 2015-07-17 DIAGNOSIS — N921 Excessive and frequent menstruation with irregular cycle: Secondary | ICD-10-CM | POA: Insufficient documentation

## 2015-07-17 DIAGNOSIS — N888 Other specified noninflammatory disorders of cervix uteri: Secondary | ICD-10-CM

## 2015-07-17 DIAGNOSIS — N941 Unspecified dyspareunia: Secondary | ICD-10-CM | POA: Insufficient documentation

## 2015-07-17 DIAGNOSIS — R102 Pelvic and perineal pain: Secondary | ICD-10-CM | POA: Insufficient documentation

## 2015-07-17 DIAGNOSIS — R1909 Other intra-abdominal and pelvic swelling, mass and lump: Secondary | ICD-10-CM | POA: Insufficient documentation

## 2015-07-17 DIAGNOSIS — N852 Hypertrophy of uterus: Secondary | ICD-10-CM | POA: Insufficient documentation

## 2015-07-17 DIAGNOSIS — D259 Leiomyoma of uterus, unspecified: Secondary | ICD-10-CM | POA: Insufficient documentation

## 2015-07-21 ENCOUNTER — Ambulatory Visit: Payer: Self-pay | Admitting: Obstetrics and Gynecology

## 2015-07-21 ENCOUNTER — Telehealth: Payer: Self-pay

## 2015-07-21 NOTE — Telephone Encounter (Signed)
Pt and daughter came by the office with paperwork for assistance for the Rifabutin, the medication she could not afford. She did not start the Amoxicillin and Prilosec. She had appointment at [facility] and when she went they said they thought they could get her the rifabutin free. Pt brought the paperwork by the office for Neil Crouch, PA to fill out.  She said we are to fax it when Magda Paganini completes the paperwork and she has attached her 1040 form with it to be faxed.  Putting the paperwork on Leslie's desk.

## 2015-07-22 NOTE — Telephone Encounter (Signed)
Forms for rifabutin completed. We need them shipped to our office and once we receive, then we will need to make sure she has PPI and amoxicillin at below dose. She will need to have someone who speaks English with her when she comes by to pick up medication.   Any PPI BID for 14 days. (we can give samples if needed). Patient is uninsured. Rifabutin 300mg  PO daily for 14 days. RX sent. Please let patient know, this drug can cause RASH, DIARRHEA, NAUSEA & VOMITING AND WILL TURN YOUR TEARS, AND URINE ORANGE.  Amoxicillin 1 gram PO BID for 14 days. RX sent.  She will need OV in 3 months with [facility] only.

## 2015-07-22 NOTE — Telephone Encounter (Signed)
I called and let pt know that Neil Crouch, PA completed the paperwork and I have faxed it. When we get the medication I will give her a call so she and her daughter can come in for instructions. She expressed understanding of that much info. Daughter is in class this Am and she will have her call if she has questions.

## 2015-07-23 ENCOUNTER — Ambulatory Visit: Payer: Self-pay | Admitting: Obstetrics and Gynecology

## 2015-07-27 ENCOUNTER — Ambulatory Visit (INDEPENDENT_AMBULATORY_CARE_PROVIDER_SITE_OTHER): Payer: Self-pay | Admitting: Obstetrics and Gynecology

## 2015-07-27 VITALS — BP 90/60 | Ht 60.0 in | Wt 136.0 lb

## 2015-07-27 DIAGNOSIS — N921 Excessive and frequent menstruation with irregular cycle: Secondary | ICD-10-CM

## 2015-07-27 DIAGNOSIS — D251 Intramural leiomyoma of uterus: Secondary | ICD-10-CM

## 2015-07-27 DIAGNOSIS — Z3009 Encounter for other general counseling and advice on contraception: Secondary | ICD-10-CM
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
## 2015-07-28 ENCOUNTER — Encounter: Payer: Self-pay | Admitting: Physician Assistant

## 2015-07-28 MED ORDER — NORGESTIMATE-ETH ESTRADIOL 0.25-35 MG-MCG PO TABS: 1.0000 | ORAL_TABLET | Freq: Every day | ORAL | Status: DC

## 2015-07-29 ENCOUNTER — Ambulatory Visit: Payer: Self-pay | Admitting: Obstetrics and Gynecology

## 2015-08-10 NOTE — Telephone Encounter (Signed)
I called and informed pt that her medication has arrived and she can bring her daughter with her and I will go over all of her instructions with her.

## 2015-08-11 NOTE — Telephone Encounter (Signed)
I spoke with [name] with [employer] and she stated they only send the stock bottle regardless of how it's written.

## 2015-08-11 NOTE — Telephone Encounter (Signed)
I called the [facility] and spoke with Napoleon.  She asked their provider Valentina Shaggy, PA-C how we should handle this and she stated we could call around to some of the local pharmacies and see if they will dispense it.

## 2015-08-11 NOTE — Telephone Encounter (Signed)
Pt is aware that we are trying to get this worked out for her medicine and I will call her when I have an answer.

## 2015-08-11 NOTE — Telephone Encounter (Signed)
We received #100 tablets of the Rifabutin 150 mg. Pt will need 2 tablets daily for 14 days or 28 tablets. Neil Crouch, PA asked Sofie Rower, our office manager to check on how to dispense since we are not a pharmacy and we cannot give the pt 100 tablets when she only needs 28. Rosendo Gros is checking this out.

## 2015-08-12 NOTE — Telephone Encounter (Signed)
PATIENT ON RECALL  °

## 2015-08-12 NOTE — Telephone Encounter (Signed)
Pt came by with her daughter and picked up the Rifabutin and prilosec. I reviewed the instructions to take 2 Rifabutin bid for 14 days and prilosec bid for 14 days. I called in new prescription for the Amoxicillin 500 mg to take 2 tablets bid for 14 days and no refills to Katie at the pharmacy. They will cancel any previous Rx's that were sent for Amoxicillin.  Pt and daughter are aware to start all medications tomorrow. Routing to [place] to nic for OV with Dr. Oneida Alar ONLY in 3 months.

## 2015-08-12 NOTE — Telephone Encounter (Signed)
I called [facility] and spoke to the pharmacist, Eulas Post. He agreed to take the bottle of Rifabutin 150 mg and count out 28 tablets for pt and dispose of the remainder. I went over and he gave me the bottle labeled for pt as required to take 2 pills daily for 14 days. Neil Crouch, PA took a look at the bottle also. Will contact pt to come by the office today.

## 2015-08-12 NOTE — Telephone Encounter (Signed)
LATE ENTRY:  I spoke to Joyce Roberts in the pharmacy at [facility] yesterday afternoon and he said he does not have an out patient pharmacist on staff at [facility]. He has never been confronted with a request like this before and has no answers at this time. He said if he found out anything that he could do he would call us back.

## 2015-09-28 ENCOUNTER — Ambulatory Visit: Payer: Self-pay | Admitting: Obstetrics and Gynecology

## 2015-09-30 ENCOUNTER — Encounter: Payer: Self-pay | Admitting: Gastroenterology

## 2015-10-14 ENCOUNTER — Ambulatory Visit: Payer: Self-pay | Admitting: Physician Assistant

## 2015-10-19 ENCOUNTER — Encounter: Payer: Self-pay | Admitting: Physician Assistant

## 2015-10-19 ENCOUNTER — Ambulatory Visit: Payer: Self-pay | Admitting: Physician Assistant

## 2015-10-19 VITALS — BP 108/68 | HR 91 | Temp 97.7°F | Ht 60.0 in | Wt 135.6 lb

## 2015-10-19 DIAGNOSIS — K5900 Constipation, unspecified: Secondary | ICD-10-CM

## 2015-10-19 DIAGNOSIS — D649 Anemia, unspecified: Secondary | ICD-10-CM

## 2015-10-19 NOTE — Progress Notes (Signed)
BP 108/68 (BP Location: Left Arm, Patient Position: Sitting, Cuff Size: Normal)   Pulse 91   Temp 97.7 F (36.5 C)   Ht 5' (1.524 m)   Wt 135 lb 9.6 oz (61.5 kg)   SpO2 98%   BMI 26.48 kg/m    Subjective:    Patient ID: Joyce Roberts, female    DOB: 11/28/1970, 45 y.o.   MRN: [id number]  HPI: Joyce Roberts is a 45 y.o. female presenting on 10/19/2015 for Anemia   HPI Pt went to gyn in May.  She says she is supposed to f/u with gyn soon.  She needs to call for appointment  She says GI is supposed to call her for f/u in August  She stopped her iron due to GI treatment and never restarted it   Relevant past medical, surgical, family and social history reviewed and updated as indicated. Interim medical history since our last visit reviewed. Allergies and medications reviewed and updated.   Current Outpatient Prescriptions:  .  acetaminophen (TYLENOL) 500 MG tablet, Take 1,000 mg by mouth as needed., Disp: , Rfl:  .  cetirizine (ZYRTEC) 10 MG tablet, Take 10 mg by mouth daily as needed for allergies., Disp: , Rfl:  .  ibuprofen (ADVIL,MOTRIN) 200 MG tablet, Take 400 mg by mouth as needed., Disp: , Rfl:  .  norgestimate-ethinyl estradiol (ORTHO-CYCLEN,SPRINTEC,PREVIFEM) 0.25-35 MG-MCG tablet, Take 1 tablet by mouth daily., Disp: 1 Package, Rfl: 11 .  omeprazole (PRILOSEC) 20 MG capsule, Take 20 mg by mouth daily., Disp: , Rfl:    Review of Systems  Constitutional: Positive for fatigue. Negative for appetite change, chills, diaphoresis, fever and unexpected weight change.  HENT: Positive for congestion and dental problem. Negative for drooling, ear pain, facial swelling, hearing loss, sneezing, sore throat, trouble swallowing and voice change.   Eyes: Negative for pain, discharge, redness, itching and visual disturbance.  Respiratory: Negative for cough, choking, shortness of breath and wheezing.   Cardiovascular: Positive for leg swelling. Negative for chest pain and  palpitations.  Gastrointestinal: Positive for constipation. Negative for abdominal pain, blood in stool, diarrhea and vomiting.  Endocrine: Negative for cold intolerance, heat intolerance and polydipsia.  Genitourinary: Negative for decreased urine volume, dysuria and hematuria.  Musculoskeletal: Positive for back pain. Negative for arthralgias and gait problem.  Skin: Negative for rash.  Allergic/Immunologic: Positive for environmental allergies.  Neurological: Negative for seizures, syncope, light-headedness and headaches.  Hematological: Negative for adenopathy.  Psychiatric/Behavioral: Negative for agitation, dysphoric mood and suicidal ideas. The patient is not nervous/anxious.     Per HPI unless specifically indicated above     Objective:    BP 108/68 (BP Location: Left Arm, Patient Position: Sitting, Cuff Size: Normal)   Pulse 91   Temp 97.7 F (36.5 C)   Ht 5' (1.524 m)   Wt 135 lb 9.6 oz (61.5 kg)   SpO2 98%   BMI 26.48 kg/m   Wt Readings from Last 3 Encounters:  10/19/15 135 lb 9.6 oz (61.5 kg)  07/27/15 136 lb (61.7 kg)  07/14/15 138 lb (62.6 kg)    Physical Exam  Constitutional: She is oriented to person, place, and time. She appears well-developed and well-nourished.  HENT:  Head: Normocephalic and atraumatic.  Neck: Neck supple.  Cardiovascular: Normal rate and regular rhythm.   Pulmonary/Chest: Effort normal and breath sounds normal.  Abdominal: Soft. Bowel sounds are normal. She exhibits no mass. There is no hepatosplenomegaly. There is no tenderness.  Musculoskeletal: She  exhibits no edema.  Lymphadenopathy:    She has no cervical adenopathy.  Neurological: She is alert and oriented to person, place, and time.  Skin: Skin is warm and dry.  Psychiatric: She has a normal mood and affect. Her behavior is normal.  Vitals reviewed.       Assessment & Plan:   Encounter Diagnoses  Name Primary?  Marland Kitchen Anemia, unspecified anemia type Yes  . Constipation,  unspecified constipation type       -counseled pt to Restart the iron -Counseled on constipation -Check hemoglobin today -f/u 6 months.  RTO sooner prn

## 2015-10-19 NOTE — Patient Instructions (Signed)
Estreimiento - Adultos (Constipation, Adult) Estreimiento significa que una persona tiene menos de tres evacuaciones en una semana, dificultad para defecar, o que las heces son secas, duras, o ms grandes que lo normal. A medida que envejecemos el estreimiento es ms comn. Una dieta baja en fibra, no tomar suficientes lquidos y el uso de ciertos medicamentos pueden empeorar el estreimiento.  CAUSAS   Ciertos medicamentos, como los antidepresivos, analgsicos, suplementos de hierro, anticidos y diurticos.  Algunas enfermedades, como la diabetes, el sndrome del colon irritable, enfermedad de la tiroides, o depresin.  No beber suficiente agua.  No consumir suficientes alimentos ricos en fibra.  Situaciones de estrs o viajes.  Falta de actividad fsica o de ejercicio.  Ignorar la necesidad sbita de defecar.  Uso en exceso de laxantes. SIGNOS Y SNTOMAS   Defecar menos de tres veces por semana.  Dificultad para defecar.  Tener las heces secas y duras, o ms grandes que las normales.  Sensacin de estar lleno o hinchado.  Dolor en la parte baja del abdomen.  No sentir alivio despus de defecar. DIAGNSTICO  El mdico le har una historia clnica y un examen fsico. Pueden hacerle exmenes adicionales para el estreimiento grave. Estos estudios pueden ser:  Un radiografa con enema de bario para examinar el recto, el colon y, en algunos casos, el intestino delgado.  Una sigmoidoscopia para examinar el colon inferior.  Una colonoscopia para examinar todo el colon. TRATAMIENTO  El tratamiento depender de la gravedad del estreimiento y de la causa. Algunos tratamientos nutricionales son beber ms lquidos y comer ms alimentos ricos en fibra. El cambio en el estilo de vida incluye hacer ejercicios de manera regular. Si estas recomendaciones para realizar cambios en la dieta y en el estilo de vida no ayudan, el mdico le puede indicar el uso de laxantes de venta libre  para ayudarlo a defecar. Los medicamentos recetados se pueden prescribir si los medicamentos de venta libre no lo ayudan.  INSTRUCCIONES PARA EL CUIDADO EN EL HOGAR   Consuma alimentos con alto contenido de fibra, como frutas, vegetales, cereales integrales y porotos.  Limite los alimentos procesados ricos en grasas y azcar, como las papas fritas, hamburguesas, galletas, dulces y refrescos.  Puede agregar un suplemento de fibra a su dieta si no obtiene lo suficiente de los alimentos.  Beba suficiente lquido para mantener la orina clara o de color amarillo plido.  Haga ejercicio regularmente o segn las indicaciones del mdico.  Vaya al bao cuando sienta la necesidad de ir. No se aguante las ganas.  Tome solo medicamentos de venta libre o recetados, segn las indicaciones del mdico. No tome otros medicamentos para el estreimiento sin consultarlo antes con su mdico. SOLICITE ATENCIN MDICA DE INMEDIATO SI:   Observa sangre brillante en las heces.  El estreimiento dura ms de 4 das o empeora.  Siente dolor abdominal o rectal.  Las heces son delgadas como un lpiz.  Pierde peso de manera inexplicable. ASEGRESE DE QUE:   Comprende estas instrucciones.  Controlar su afeccin.  Recibir ayuda de inmediato si no mejora o si empeora.   Esta informacin no tiene como fin reemplazar el consejo del mdico. Asegrese de hacerle al mdico cualquier pregunta que tenga.   Document Released: 04/03/2007 Document Revised: 04/04/2014 Elsevier Interactive Patient Education 2016 [employer]  

## 2015-10-29 ENCOUNTER — Other Ambulatory Visit: Payer: Self-pay | Admitting: Physician Assistant

## 2015-10-29 DIAGNOSIS — D649 Anemia, unspecified: Secondary | ICD-10-CM

## 2015-10-29 LAB — HEMOGLOBIN: HEMOGLOBIN: 10.9 g/dL — AB (ref 11.7–15.5)

## 2015-11-12 ENCOUNTER — Encounter: Payer: Self-pay | Admitting: Gastroenterology

## 2015-11-12 ENCOUNTER — Telehealth: Payer: Self-pay | Admitting: Gastroenterology

## 2015-11-12 NOTE — Telephone Encounter (Signed)
APPT MADE AND LETTER SENT  °

## 2015-11-12 NOTE — Telephone Encounter (Signed)
CONTACT PT FOR O[facility] E30 H PYLORI GASTRITIS

## 2015-11-24 ENCOUNTER — Other Ambulatory Visit: Payer: Self-pay | Admitting: Student

## 2015-11-24 DIAGNOSIS — D649 Anemia, unspecified: Secondary | ICD-10-CM

## 2015-11-26 LAB — HEMATOCRIT: HCT: 38 % (ref 35.0–45.0)

## 2015-11-26 LAB — HEMOGLOBIN: HEMOGLOBIN: 11.9 g/dL (ref 11.7–15.5)

## 2015-12-08 ENCOUNTER — Other Ambulatory Visit: Payer: Self-pay | Admitting: Physician Assistant

## 2015-12-08 ENCOUNTER — Ambulatory Visit (INDEPENDENT_AMBULATORY_CARE_PROVIDER_SITE_OTHER): Payer: Self-pay | Admitting: Gastroenterology

## 2015-12-08 ENCOUNTER — Encounter: Payer: Self-pay | Admitting: Gastroenterology

## 2015-12-08 VITALS — BP 112/67 | HR 73 | Temp 97.8°F | Ht 60.0 in | Wt 133.2 lb

## 2015-12-08 DIAGNOSIS — R1013 Epigastric pain: Secondary | ICD-10-CM

## 2015-12-08 DIAGNOSIS — B9681 Helicobacter pylori [H. pylori] as the cause of diseases classified elsewhere: Secondary | ICD-10-CM

## 2015-12-08 DIAGNOSIS — K920 Hematemesis: Secondary | ICD-10-CM

## 2015-12-08 DIAGNOSIS — R112 Nausea with vomiting, unspecified: Secondary | ICD-10-CM | POA: Insufficient documentation

## 2015-12-08 DIAGNOSIS — K5900 Constipation, unspecified: Secondary | ICD-10-CM

## 2015-12-08 DIAGNOSIS — K2970 Gastritis, unspecified, without bleeding: Secondary | ICD-10-CM

## 2015-12-08 DIAGNOSIS — D649 Anemia, unspecified: Secondary | ICD-10-CM

## 2015-12-08 DIAGNOSIS — R110 Nausea: Secondary | ICD-10-CM

## 2015-12-08 NOTE — Patient Instructions (Addendum)
1. Please stop omeprazole. In 2 weeks, collect stool specimen and drop off at lab. We wanted to make sure your stomach infection was adequately treated. 2. Once we received your stool results, we will plan on upper endoscopy to further evaluate your symptoms. 3. Please have lab work done today or tomorrow to further evaluate your weakness, dizziness, abdominal pain. 4. Please take two stool softeners at bedtime to help with constipation.
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
## 2015-12-09 LAB — CBC WITH DIFFERENTIAL/PLATELET
BASOS PCT: 1 %
Basophils Absolute: 64 cells/uL (ref 0–200)
EOS PCT: 2 %
Eosinophils Absolute: 128 cells/uL (ref 15–500)
HCT: 35.7 % (ref 35.0–45.0)
HEMOGLOBIN: 11.4 g/dL — AB (ref 11.7–15.5)
Lymphocytes Relative: 29 %
Lymphs Abs: 1856 cells/uL (ref 850–3900)
MCH: 25.6 pg — ABNORMAL LOW (ref 27.0–33.0)
MCHC: 31.9 g/dL — ABNORMAL LOW (ref 32.0–36.0)
MCV: 80.2 fL (ref 80.0–100.0)
MONOS PCT: 9 %
MPV: 10.1 fL (ref 7.5–12.5)
Monocytes Absolute: 576 cells/uL (ref 200–950)
NEUTROS ABS: 3776 {cells}/uL (ref 1500–7800)
Neutrophils Relative %: 59 %
PLATELETS: 262 10*3/uL (ref 140–400)
RBC: 4.45 MIL/uL (ref 3.80–5.10)
RDW: 17.6 % — ABNORMAL HIGH (ref 11.0–15.0)
WBC: 6.4 10*3/uL (ref 3.8–10.8)

## 2015-12-09 LAB — COMPREHENSIVE METABOLIC PANEL
ALBUMIN: 3.8 g/dL (ref 3.6–5.1)
ALT: 8 U/L (ref 6–29)
AST: 12 U/L (ref 10–35)
Alkaline Phosphatase: 59 U/L (ref 33–115)
BILIRUBIN TOTAL: 0.2 mg/dL (ref 0.2–1.2)
BUN: 11 mg/dL (ref 7–25)
CHLORIDE: 105 mmol/L (ref 98–110)
CO2: 23 mmol/L (ref 20–31)
CREATININE: 0.62 mg/dL (ref 0.50–1.10)
Calcium: 8.6 mg/dL (ref 8.6–10.2)
Glucose, Bld: 88 mg/dL (ref 65–99)
Potassium: 4.2 mmol/L (ref 3.5–5.3)
SODIUM: 137 mmol/L (ref 135–146)
TOTAL PROTEIN: 7.1 g/dL (ref 6.1–8.1)

## 2015-12-09 LAB — IRON AND TIBC
%SAT: 4 % — ABNORMAL LOW (ref 11–50)
IRON: 15 ug/dL — AB (ref 40–190)
TIBC: 403 ug/dL (ref 250–450)
UIBC: 388 ug/dL (ref 125–400)

## 2015-12-09 LAB — LIPASE: LIPASE: 81 U/L — AB (ref 7–60)

## 2015-12-09 LAB — FERRITIN: Ferritin: 10 ng/mL (ref 10–232)

## 2015-12-13 NOTE — Assessment & Plan Note (Signed)
45 y/o Hispanic female with recurrent epigastric burning, N/V with limited hematemesis in setting of refractory H.pylori gastritis. EGD last done in 02/2014. Completed 3 different regimens for H.pylori. Suspicious for refractory gastritis and/or PUD based on symptoms. She will likely need updated EGD with PROPOFOL but would like to check H.pylori stool antigen first. If positive, she would require resistance testing at time of EGD. Will update labs given abdominal pain and h/o anemia.  I have discussed the risks, alternatives, benefits with regards to but not limited to the risk of reaction to medication, bleeding, infection, perforation and the patient is agreeable to proceed. Written consent to be obtained.

## 2015-12-13 NOTE — Assessment & Plan Note (Signed)
Daily BM, but add stool softener for [place] 1 stools.

## 2015-12-14 NOTE — Progress Notes (Signed)
CC'ED TO PCP 

## 2015-12-20 NOTE — Progress Notes (Signed)
Please let patient know her Hgb is slightly low but stable X 2 years. She does have IDA. Her lipase is slightly elevated, but nonspecific.  1# Does she have heavy menses? 2# She needs ifobt. 3# collect H.pylori stool antigen as planned.

## 2015-12-21 NOTE — Progress Notes (Signed)
I called and spoke to pt. She seemed to understand most of what I said. She is going to have her daughter call me when she gets home from school . She said she does have heavy menses each month.

## 2015-12-23 ENCOUNTER — Telehealth: Payer: Self-pay

## 2015-12-23 NOTE — Progress Notes (Signed)
Mailed letter for pt to have her daughter call me.

## 2015-12-23 NOTE — Telephone Encounter (Signed)
Pt's daughter called and is aware of the plan.  Her mom will come by the office Friday before noon and I will give her the iFOBT, and review the instructions with her. Then the daughter will call if there are questions.

## 2015-12-25 NOTE — Telephone Encounter (Signed)
PT came by and picked up the iFOBT. She seemed to understand it completely.

## 2015-12-31 ENCOUNTER — Ambulatory Visit (INDEPENDENT_AMBULATORY_CARE_PROVIDER_SITE_OTHER): Payer: Self-pay

## 2015-12-31 DIAGNOSIS — R1013 Epigastric pain: Secondary | ICD-10-CM

## 2015-12-31 LAB — CBC WITH DIFFERENTIAL/PLATELET
BASOS PCT: 0 %
Basophils Absolute: 0 cells/uL (ref 0–200)
Eosinophils Absolute: 154 cells/uL (ref 15–500)
Eosinophils Relative: 2 %
HCT: 35.2 % (ref 35.0–45.0)
Hemoglobin: 11.3 g/dL — ABNORMAL LOW (ref 11.7–15.5)
LYMPHS PCT: 26 %
Lymphs Abs: 2002 cells/uL (ref 850–3900)
MCH: 25.6 pg — ABNORMAL LOW (ref 27.0–33.0)
MCHC: 32.1 g/dL (ref 32.0–36.0)
MCV: 79.6 fL — ABNORMAL LOW (ref 80.0–100.0)
MPV: 9.7 fL (ref 7.5–12.5)
Monocytes Absolute: 539 cells/uL (ref 200–950)
Monocytes Relative: 7 %
Neutro Abs: 5005 cells/uL (ref 1500–7800)
Neutrophils Relative %: 65 %
PLATELETS: 252 10*3/uL (ref 140–400)
RBC: 4.42 MIL/uL (ref 3.80–5.10)
RDW: 17.2 % — AB (ref 11.0–15.0)
WBC: 7.7 10*3/uL (ref 3.8–10.8)

## 2015-12-31 LAB — COMPREHENSIVE METABOLIC PANEL
ALBUMIN: 4 g/dL (ref 3.6–5.1)
ALT: 6 U/L (ref 6–29)
AST: 13 U/L (ref 10–35)
Alkaline Phosphatase: 65 U/L (ref 33–115)
BILIRUBIN TOTAL: 0.4 mg/dL (ref 0.2–1.2)
BUN: 10 mg/dL (ref 7–25)
CALCIUM: 8.9 mg/dL (ref 8.6–10.2)
CHLORIDE: 106 mmol/L (ref 98–110)
CO2: 24 mmol/L (ref 20–31)
Creat: 0.74 mg/dL (ref 0.50–1.10)
Glucose, Bld: 82 mg/dL (ref 65–99)
Potassium: 4.2 mmol/L (ref 3.5–5.3)
Sodium: 140 mmol/L (ref 135–146)
Total Protein: 6.9 g/dL (ref 6.1–8.1)

## 2015-12-31 LAB — IFOBT (OCCULT BLOOD): IFOBT: NEGATIVE

## 2015-12-31 LAB — LIPASE: LIPASE: 67 U/L — AB (ref 7–60)

## 2015-12-31 LAB — IRON AND TIBC
%SAT: 15 % (ref 11–50)
Iron: 66 ug/dL (ref 40–190)
TIBC: 445 ug/dL (ref 250–450)
UIBC: 379 ug/dL (ref 125–400)

## 2015-12-31 LAB — FERRITIN: Ferritin: 7 ng/mL — ABNORMAL LOW (ref 10–232)

## 2016-01-01 LAB — HELICOBACTER PYLORI  SPECIAL ANTIGEN: H. PYLORI ANTIGEN STOOL: DETECTED

## 2016-01-13 NOTE — Progress Notes (Signed)
Doris, did I send this to you?

## 2016-01-13 NOTE — Progress Notes (Signed)
Ifobt negative. H.pylori stool Ag was POSITIVE IDA. Last TCS 2 years ago.  Lipase minimally up, nonspecific, ?related to gastric issues.  Patient needs EGD with SLF in OR WITH H. PYLORI SENSITIVITY TESTING.  Please have patient start ferrous sulfate 325mg  once daily for three months.

## 2016-01-14 ENCOUNTER — Telehealth: Payer: Self-pay

## 2016-01-14 NOTE — Progress Notes (Signed)
PT and her daughter Joyce Roberts are aware. Pt has been scheduled for 02/16/2016 @ 9:15 Am with Dr. Oneida Alar for the EGD with H pylori sensitivity testing.

## 2016-01-15 ENCOUNTER — Other Ambulatory Visit: Payer: Self-pay

## 2016-01-15 DIAGNOSIS — D509 Iron deficiency anemia, unspecified: Secondary | ICD-10-CM

## 2016-01-15 NOTE — Telephone Encounter (Signed)
Gastroenterology Pre-Procedure Review  Request Date: 01/14/2016 Requesting Physician: Neil Crouch, PA  PATIENT REVIEW QUESTIONS: The patient responded to the following health history questions as indicated:    1. Diabetes Melitis: no 2. Joint replacements in the past 12 months: no 3. Major health problems in the past 3 months: no 4. Has an artificial valve or MVP: no 5. Has a defibrillator: no 6. Has been advised in past to take antibiotics in advance of a procedure like teeth cleaning: no 7. Family history of colon cancer: no  8. Alcohol Use: no 9. History of sleep apnea: no  10. History of coronary artery or other vascular stents placed within the last 12 months: no    MEDICATIONS & ALLERGIES:    Patient reports the following regarding taking any blood thinners:   Plavix? no Aspirin? no Coumadin? no Brilinta? no Xarelto? no Eliquis? no Pradaxa? no Savaysa? no Effient? no  Patient confirms/reports the following medications:  Current Outpatient Prescriptions  Medication Sig Dispense Refill  . cetirizine (ZYRTEC) 10 MG tablet Take 10 mg by mouth daily as needed for allergies.    Marland Kitchen acetaminophen (TYLENOL) 500 MG tablet Take 1,000 mg by mouth as needed.    Marland Kitchen ibuprofen (ADVIL,MOTRIN) 200 MG tablet Take 400 mg by mouth as needed.    . norgestimate-ethinyl estradiol (ORTHO-CYCLEN,SPRINTEC,PREVIFEM) 0.25-35 MG-MCG tablet Take 1 tablet by mouth daily. 1 Package 11  . omeprazole (PRILOSEC) 20 MG capsule Take 20 mg by mouth daily.     No current facility-administered medications for this visit.     Patient confirms/reports the following allergies:  Allergies  Allergen Reactions  . Prednisone Other (See Comments)    Caused her heart to beat very fast, had to go to hospital  . Robitussin (Alcohol Free) [Guaifenesin]   . Dexilant [Dexlansoprazole]     NAUSEA/ABDOMINAL PAIN  . Doxycycline Hives  . Tetracyclines & Related Hives    No orders of the defined types were placed in  this encounter.   AUTHORIZATION INFORMATION Primary Insurance:   ID #:  Group #:  Pre-Cert / Auth required:  Pre-Cert / Auth #:   Secondary Insurance:   ID #:  Group #:  Pre-Cert / Auth required:  Pre-Cert / Auth #:   SCHEDULE INFORMATION: Procedure has been scheduled as follows:  Date:  02/16/2016           Time:  9:15 AM Location: [facility] Short Stay  This Gastroenterology Pre-Precedure Review Form is being routed to the following provider(s): Barney Drain, MD

## 2016-01-18 NOTE — Telephone Encounter (Signed)
Please make sure endo and Dr. Oneida Alar are aware of the need for H.pylori sensitivity testing. Otherwise ok to schedule.

## 2016-01-25 NOTE — Telephone Encounter (Signed)
Pt is aware of the pre-op appt on 02/11/2016 at 11:00 am. She is also aware the procedure will be on 02/16/2016 and they will let her know what time to be at the hospital when she goes to pre-op.  I have entered the H Pylori Sensitivity testing in the comments on the order. Also, sent Dr. Oneida Alar a reminder as a staff message and routing the note to her.

## 2016-01-25 NOTE — Telephone Encounter (Signed)
Information for the EGD is being mailed to pt.

## 2016-01-26 NOTE — Telephone Encounter (Signed)
REVIEWED. PYLOPLUS ON DAY OF EGD.

## 2016-02-10 NOTE — Patient Instructions (Signed)
Joyce Roberts  02/10/2016     @PREFPERIOPPHARMACY @   Your procedure is scheduled on  02/16/2016   Report to [facility] at  730  A.M.  Call this number if you have problems the morning of surgery:  [phone]   Remember:  Do not eat food or drink liquids after midnight.  Take these medicines the morning of surgery with A SIP OF WATER  Zyrtec, prilosec.   Do not wear jewelry, make-up or nail polish.  Do not wear lotions, powders, or perfumes, or deoderant.  Do not shave 48 hours prior to surgery.  Men may shave face and neck.  Do not bring valuables to the hospital.  [facility] is not responsible for any belongings or valuables.  Contacts, dentures or bridgework may not be worn into surgery.  Leave your suitcase in the car.  After surgery it may be brought to your room.  For patients admitted to the hospital, discharge time will be determined by your treatment team.  Patients discharged the day of surgery will not be allowed to drive home.   Name and phone number of your driver:   family Special instructions:  Follow the diet and prep instructions given to you by Dr Nona Dell office.  Please read over the following fact sheets that you were given. Anesthesia Post-op Instructions and Care and Recovery After Surgery       Esophagogastroduodenoscopy Introduction Esophagogastroduodenoscopy (EGD) is a procedure to examine the lining of the esophagus, stomach, and first part of the small intestine (duodenum). This procedure is done to check for problems such as inflammation, bleeding, ulcers, or growths. During this procedure, a long, flexible, lighted tube with a camera attached (endoscope) is inserted down the throat. Tell a health care provider about:  Any allergies you have.  All medicines you are taking, including vitamins, herbs, eye drops, creams, and over-the-counter medicines.  Any problems you or family members have had with anesthetic  medicines.  Any blood disorders you have.  Any surgeries you have had.  Any medical conditions you have.  Whether you are pregnant or may be pregnant. What are the risks? Generally, this is a safe procedure. However, problems may occur, including:  Infection.  Bleeding.  A tear (perforation) in the esophagus, stomach, or duodenum.  Trouble breathing.  Excessive sweating.  Spasms of the larynx.  A slowed heartbeat.  Low blood pressure. What happens before the procedure?  Follow instructions from your health care provider about eating or drinking restrictions.  Ask your health care provider about:  Changing or stopping your regular medicines. This is especially important if you are taking diabetes medicines or blood thinners.  Taking medicines such as aspirin and ibuprofen. These medicines can thin your blood. Do not take these medicines before your procedure if your health care provider instructs you not to.  Plan to have someone take you home after the procedure.  If you wear dentures, be ready to remove them before the procedure. What happens during the procedure?  To reduce your risk of infection, your health care team will wash or sanitize their hands.  An IV tube will be put in a vein in your hand or arm. You will get medicines and fluids through this tube.  You will be given one or more of the following:  A medicine to help you relax (sedative).  A medicine to numb the area (local anesthetic). This medicine may be sprayed  into your throat. It will make you feel more comfortable and keep you from gagging or coughing during the procedure.  A medicine for pain.  A mouth guard may be placed in your mouth to protect your teeth and to keep you from biting on the endoscope.  You will be asked to lie on your left side.  The endoscope will be lowered down your throat into your esophagus, stomach, and duodenum.  Air will be put into the endoscope. This will help  your health care provider see better.  The lining of your esophagus, stomach, and duodenum will be examined.  Your health care provider may:  Take a tissue sample so it can be looked at in a lab (biopsy).  Remove growths.  Remove objects (foreign bodies) that are stuck.  Treat any bleeding with medicines or other devices that stop tissue from bleeding.  Widen (dilate) or stretch narrowed areas of your esophagus and stomach.  The endoscope will be taken out. The procedure may vary among health care providers and hospitals. What happens after the procedure?  Your blood pressure, heart rate, breathing rate, and blood oxygen level will be monitored often until the medicines you were given have worn off.  Do not eat or drink anything until the numbing medicine has worn off and your gag reflex has returned. This information is not intended to replace advice given to you by your health care provider. Make sure you discuss any questions you have with your health care provider. Document Released: 07/15/2004 Document Revised: 08/20/2015 Document Reviewed: 02/05/2015  2017 Elsevier Esophagogastroduodenoscopy, Care After Introduction Refer to this sheet in the next few weeks. These instructions provide you with information about caring for yourself after your procedure. Your health care provider may also give you more specific instructions. Your treatment has been planned according to current medical practices, but problems sometimes occur. Call your health care provider if you have any problems or questions after your procedure. What can I expect after the procedure? After the procedure, it is common to have:  A sore throat.  Nausea.  Bloating.  Dizziness.  Fatigue. Follow these instructions at home:  Do not eat or drink anything until the numbing medicine (local anesthetic) has worn off and your gag reflex has returned. You will know that the local anesthetic has worn off when you  can swallow comfortably.  Do not drive for 24 hours if you received a medicine to help you relax (sedative).  If your health care provider took a tissue sample for testing during the procedure, make sure to get your test results. This is your responsibility. Ask your health care provider or the department performing the test when your results will be ready.  Keep all follow-up visits as told by your health care provider. This is important. Contact a health care provider if:  You cannot stop coughing.  You are not urinating.  You are urinating less than usual. Get help right away if:  You have trouble swallowing.  You cannot eat or drink.  You have throat or chest pain that gets worse.  You are dizzy or light-headed.  You faint.  You have nausea or vomiting.  You have chills.  You have a fever.  You have severe abdominal pain.  You have black, tarry, or bloody stools. This information is not intended to replace advice given to you by your health care provider. Make sure you discuss any questions you have with your health care provider. Document Released: 02/29/2012  Document Revised: 08/20/2015 Document Reviewed: 02/05/2015  2017 Elsevier  Monitored Anesthesia Care Anesthesia is a term that refers to techniques, procedures, and medicines that help a person stay safe and comfortable during a medical procedure. Monitored anesthesia care, or sedation, is one type of anesthesia. Your anesthesia specialist may recommend sedation if you will be having a procedure that does not require you to be unconscious, such as:  Cataract surgery.  A dental procedure.  A biopsy.  A colonoscopy. During the procedure, you may receive a medicine to help you relax (sedative). There are three levels of sedation:  Mild sedation. At this level, you may feel awake and relaxed. You will be able to follow directions.  Moderate sedation. At this level, you will be sleepy. You may not remember  the procedure.  Deep sedation. At this level, you will be asleep. You will not remember the procedure. The more medicine you are given, the deeper your level of sedation will be. Depending on how you respond to the procedure, the anesthesia specialist may change your level of sedation or the type of anesthesia to fit your needs. An anesthesia specialist will monitor you closely during the procedure. Let your health care provider know about:  Any allergies you have.  All medicines you are taking, including vitamins, herbs, eye drops, creams, and over-the-counter medicines.  Any use of steroids (by mouth or as a cream).  Any problems you or family members have had with sedatives and anesthetic medicines.  Any blood disorders you have.  Any surgeries you have had.  Any medical conditions you have, such as sleep apnea.  Whether you are pregnant or may be pregnant.  Any use of cigarettes, alcohol, or street drugs. What are the risks? Generally, this is a safe procedure. However, problems may occur, including:  Getting too much medicine (oversedation).  Nausea.  Allergic reaction to medicines.  Trouble breathing. If this happens, a breathing tube may be used to help with breathing. It will be removed when you are awake and breathing on your own.  Heart trouble.  Lung trouble. Before the procedure Staying hydrated  Follow instructions from your health care provider about hydration, which may include:  Up to 2 hours before the procedure - you may continue to drink clear liquids, such as water, clear fruit juice, black coffee, and plain tea. Eating and drinking restrictions  Follow instructions from your health care provider about eating and drinking, which may include:  8 hours before the procedure - stop eating heavy meals or foods such as meat, fried foods, or fatty foods.  6 hours before the procedure - stop eating light meals or foods, such as toast or cereal.  6 hours  before the procedure - stop drinking milk or drinks that contain milk.  2 hours before the procedure - stop drinking clear liquids. Medicines  Ask your health care provider about:  Changing or stopping your regular medicines. This is especially important if you are taking diabetes medicines or blood thinners.  Taking medicines such as aspirin and ibuprofen. These medicines can thin your blood. Do not take these medicines before your procedure if your health care provider instructs you not to. Tests and exams  You will have a physical exam.  You may have blood tests done to show:  How well your kidneys and liver are working.  How well your blood can clot.  General instructions  Plan to have someone take you home from the hospital or clinic.  If  you will be going home right after the procedure, plan to have someone with you for 24 hours. What happens during the procedure?  Your blood pressure, heart rate, breathing, level of pain and overall condition will be monitored.  An IV tube will be inserted into one of your veins.  Your anesthesia specialist will give you medicines as needed to keep you comfortable during the procedure. This may mean changing the level of sedation.  The procedure will be performed. After the procedure  Your blood pressure, heart rate, breathing rate, and blood oxygen level will be monitored until the medicines you were given have worn off.  Do not drive for 24 hours if you received a sedative.  You may:  Feel sleepy, clumsy, or nauseous.  Feel forgetful about what happened after the procedure.  Have a sore throat if you had a breathing tube during the procedure.  Vomit. This information is not intended to replace advice given to you by your health care provider. Make sure you discuss any questions you have with your health care provider. Document Released: 12/08/2004 Document Revised: 08/21/2015 Document Reviewed: 07/05/2015 Elsevier Interactive  Patient Education  2017 [place] POST-ANESTHESIA  IMMEDIATELY FOLLOWING SURGERY:  Do not drive or operate machinery for the first twenty four hours after surgery.  Do not make any important decisions for twenty four hours after surgery or while taking narcotic pain medications or sedatives.  If you develop intractable nausea and vomiting or a severe headache please notify your doctor immediately.  FOLLOW-UP:  Please make an appointment with your surgeon as instructed. You do not need to follow up with anesthesia unless specifically instructed to do so.  WOUND CARE INSTRUCTIONS (if applicable):  Keep a dry clean dressing on the anesthesia/puncture wound site if there is drainage.  Once the wound has quit draining you may leave it open to air.  Generally you should leave the bandage intact for twenty four hours unless there is drainage.  If the epidural site drains for more than 36-48 hours please call the anesthesia department.  QUESTIONS?:  Please feel free to call your physician or the hospital operator if you have any questions, and they will be happy to assist you.

## 2016-02-11 ENCOUNTER — Encounter (HOSPITAL_COMMUNITY)
Admission: RE | Admit: 2016-02-11 | Discharge: 2016-02-11 | Disposition: A | Discharge status: Home / Self Care | Payer: Self-pay | Source: Ambulatory Visit | Attending: Gastroenterology | Admitting: Gastroenterology

## 2016-02-11 ENCOUNTER — Encounter (HOSPITAL_COMMUNITY): Payer: Self-pay

## 2016-02-11 DIAGNOSIS — Z01812 Encounter for preprocedural laboratory examination: Secondary | ICD-10-CM | POA: Insufficient documentation

## 2016-02-11 DIAGNOSIS — K2960 Other gastritis without bleeding: Secondary | ICD-10-CM | POA: Insufficient documentation

## 2016-02-11 DIAGNOSIS — D509 Iron deficiency anemia, unspecified: Secondary | ICD-10-CM | POA: Insufficient documentation

## 2016-02-11 HISTORY — DX: Gastro-esophageal reflux disease without esophagitis: K21.9

## 2016-02-11 HISTORY — DX: Anemia, unspecified: D64.9

## 2016-02-11 LAB — BASIC METABOLIC PANEL
Anion gap: 6 (ref 5–15)
BUN: 12 mg/dL (ref 6–20)
CHLORIDE: 105 mmol/L (ref 101–111)
CO2: 26 mmol/L (ref 22–32)
CREATININE: 0.67 mg/dL (ref 0.44–1.00)
Calcium: 8.9 mg/dL (ref 8.9–10.3)
GFR calc non Af Amer: >60 mL/min (ref >60)
Glucose, Bld: 86 mg/dL (ref 65–99)
Potassium: 4.2 mmol/L (ref 3.5–5.1)
Sodium: 137 mmol/L (ref 135–145)

## 2016-02-11 LAB — HCG, SERUM, QUALITATIVE: Preg, Serum: NEGATIVE

## 2016-02-11 LAB — CBC WITH DIFFERENTIAL/PLATELET
BASOS PCT: 0 %
Basophils Absolute: 0 10*3/uL (ref 0.0–0.1)
Eosinophils Absolute: 0.1 10*3/uL (ref 0.0–0.7)
Eosinophils Relative: 1 %
HEMATOCRIT: 38.9 % (ref 36.0–46.0)
HEMOGLOBIN: 12.5 g/dL (ref 12.0–15.0)
LYMPHS ABS: 2.1 10*3/uL (ref 0.7–4.0)
LYMPHS PCT: 27 %
MCH: 26.5 pg (ref 26.0–34.0)
MCHC: 32.1 g/dL (ref 30.0–36.0)
MCV: 82.6 fL (ref 78.0–100.0)
MONOS PCT: 6 %
Monocytes Absolute: 0.5 10*3/uL (ref 0.1–1.0)
NEUTROS ABS: 5.2 10*3/uL (ref 1.7–7.7)
NEUTROS PCT: 66 %
Platelets: 255 10*3/uL (ref 150–400)
RBC: 4.71 MIL/uL (ref 3.87–5.11)
RDW: 16.2 % — ABNORMAL HIGH (ref 11.5–15.5)
WBC: 7.8 10*3/uL (ref 4.0–10.5)

## 2016-02-16 ENCOUNTER — Encounter (HOSPITAL_COMMUNITY): Payer: Self-pay | Admitting: *Deleted

## 2016-02-16 ENCOUNTER — Ambulatory Visit (HOSPITAL_COMMUNITY): Payer: Self-pay | Admitting: Anesthesiology

## 2016-02-16 ENCOUNTER — Encounter (HOSPITAL_COMMUNITY)
Admission: RE | Disposition: A | Discharge status: Home / Self Care | Payer: Self-pay | Source: Ambulatory Visit | Attending: Gastroenterology

## 2016-02-16 ENCOUNTER — Ambulatory Visit (HOSPITAL_COMMUNITY)
Admission: RE | Admit: 2016-02-16 | Discharge: 2016-02-16 | Disposition: A | Discharge status: Home / Self Care | Payer: Self-pay | Source: Ambulatory Visit | Attending: Gastroenterology | Admitting: Gastroenterology

## 2016-02-16 DIAGNOSIS — R1011 Right upper quadrant pain: Secondary | ICD-10-CM | POA: Insufficient documentation

## 2016-02-16 DIAGNOSIS — Z21 Asymptomatic human immunodeficiency virus [HIV] infection status: Secondary | ICD-10-CM | POA: Insufficient documentation

## 2016-02-16 DIAGNOSIS — K2970 Gastritis, unspecified, without bleeding: Secondary | ICD-10-CM | POA: Insufficient documentation

## 2016-02-16 DIAGNOSIS — D509 Iron deficiency anemia, unspecified: Secondary | ICD-10-CM

## 2016-02-16 DIAGNOSIS — K219 Gastro-esophageal reflux disease without esophagitis: Secondary | ICD-10-CM | POA: Insufficient documentation

## 2016-02-16 DIAGNOSIS — R1013 Epigastric pain: Secondary | ICD-10-CM | POA: Insufficient documentation

## 2016-02-16 DIAGNOSIS — Z791 Long term (current) use of non-steroidal anti-inflammatories (NSAID): Secondary | ICD-10-CM | POA: Insufficient documentation

## 2016-02-16 DIAGNOSIS — Z79899 Other long term (current) drug therapy: Secondary | ICD-10-CM | POA: Insufficient documentation

## 2016-02-16 DIAGNOSIS — E7800 Pure hypercholesterolemia, unspecified: Secondary | ICD-10-CM | POA: Insufficient documentation

## 2016-02-16 DIAGNOSIS — T39395A Adverse effect of other nonsteroidal anti-inflammatory drugs [NSAID], initial encounter: Secondary | ICD-10-CM | POA: Insufficient documentation

## 2016-02-16 HISTORY — PX: ESOPHAGOGASTRODUODENOSCOPY (EGD) WITH PROPOFOL: SHX5813

## 2016-02-16 SURGERY — ESOPHAGOGASTRODUODENOSCOPY (EGD) WITH PROPOFOL
Anesthesia: Monitor Anesthesia Care

## 2016-02-16 MED ORDER — LIDOCAINE VISCOUS 2 % MT SOLN
OROMUCOSAL | Status: AC
  Filled 2016-02-16: qty 15

## 2016-02-16 MED ORDER — SCOPOLAMINE 1 MG/3DAYS TD PT72
1.0000 | MEDICATED_PATCH | Freq: Once | TRANSDERMAL | Status: DC
  Administered 2016-02-16: 1.5 mg via TRANSDERMAL

## 2016-02-16 MED ORDER — LIDOCAINE VISCOUS 2 % MT SOLN
5.0000 mL | Freq: Two times a day (BID) | OROMUCOSAL | Status: DC
  Administered 2016-02-16: 5 mL via OROMUCOSAL

## 2016-02-16 MED ORDER — SCOPOLAMINE 1 MG/3DAYS TD PT72
MEDICATED_PATCH | TRANSDERMAL | Status: AC
  Filled 2016-02-16: qty 1

## 2016-02-16 MED ORDER — OMEPRAZOLE 20 MG PO CPDR: DELAYED_RELEASE_CAPSULE | ORAL | 11 refills | Status: DC

## 2016-02-16 MED ORDER — MIDAZOLAM HCL 2 MG/2ML IJ SOLN
INTRAMUSCULAR | Status: AC
  Filled 2016-02-16: qty 2

## 2016-02-16 MED ORDER — MIDAZOLAM HCL 5 MG/5ML IJ SOLN
INTRAMUSCULAR | Status: DC | PRN
  Administered 2016-02-16: 2 mg via INTRAVENOUS

## 2016-02-16 MED ORDER — ONDANSETRON HCL 4 MG/2ML IJ SOLN
INTRAMUSCULAR | Status: AC
  Filled 2016-02-16: qty 2

## 2016-02-16 MED ORDER — LACTATED RINGERS IV SOLN
INTRAVENOUS | Status: DC
  Administered 2016-02-16: 1000 mL via INTRAVENOUS

## 2016-02-16 MED ORDER — BUSPIRONE HCL 5 MG PO TABS: 5.0000 mg | ORAL_TABLET | Freq: Three times a day (TID) | ORAL | 11 refills | Status: DC

## 2016-02-16 MED ORDER — FENTANYL CITRATE (PF) 100 MCG/2ML IJ SOLN
INTRAMUSCULAR | Status: AC
  Filled 2016-02-16: qty 2

## 2016-02-16 MED ORDER — PROPOFOL 10 MG/ML IV BOLUS
INTRAVENOUS | Status: AC
  Filled 2016-02-16: qty 20

## 2016-02-16 MED ORDER — DEXAMETHASONE SODIUM PHOSPHATE 4 MG/ML IJ SOLN
INTRAMUSCULAR | Status: AC
  Filled 2016-02-16: qty 1

## 2016-02-16 MED ORDER — PROPOFOL 500 MG/50ML IV EMUL
INTRAVENOUS | Status: DC | PRN
  Administered 2016-02-16: 100 ug/kg/min via INTRAVENOUS

## 2016-02-16 MED ORDER — DEXAMETHASONE SODIUM PHOSPHATE 4 MG/ML IJ SOLN
4.0000 mg | INTRAMUSCULAR | Status: AC
  Administered 2016-02-16: 4 mg via INTRAVENOUS

## 2016-02-16 MED ORDER — ONDANSETRON HCL 4 MG/2ML IJ SOLN
4.0000 mg | Freq: Once | INTRAMUSCULAR | Status: AC
  Administered 2016-02-16: 4 mg via INTRAVENOUS

## 2016-02-16 MED ORDER — CHLORHEXIDINE GLUCONATE CLOTH 2 % EX PADS: 6.0000 | MEDICATED_PAD | Freq: Once | CUTANEOUS | Status: DC

## 2016-02-16 MED ORDER — MIDAZOLAM HCL 2 MG/2ML IJ SOLN
1.0000 mg | INTRAMUSCULAR | Status: DC | PRN
  Administered 2016-02-16: 2 mg via INTRAVENOUS

## 2016-02-16 MED ORDER — FENTANYL CITRATE (PF) 100 MCG/2ML IJ SOLN
25.0000 ug | Freq: Once | INTRAMUSCULAR | Status: AC
  Administered 2016-02-16: 25 ug via INTRAVENOUS

## 2016-02-16 NOTE — Anesthesia Postprocedure Evaluation (Signed)
Anesthesia Post Note  Patient: Joyce Roberts  Procedure(s) Performed: Procedure(s) (LRB): ESOPHAGOGASTRODUODENOSCOPY (EGD) WITH PROPOFOL (N/A)  Patient location during evaluation: PACU Anesthesia Type: MAC Level of consciousness: awake and patient cooperative Pain management: pain level controlled Vital Signs Assessment: post-procedure vital signs reviewed and stable Respiratory status: spontaneous breathing, nonlabored ventilation and respiratory function stable Cardiovascular status: blood pressure returned to baseline Postop Assessment: no signs of nausea or vomiting Anesthetic complications: no    Last Vitals:  Vitals:   02/16/16 0910 02/16/16 0915  BP: 125/79 135/79  Pulse:    Resp: 19 11  Temp:      Last Pain:  Vitals:   02/16/16 0803  TempSrc: Oral  PainSc: 1                  Lanie Schelling J

## 2016-02-16 NOTE — OR Nursing (Signed)
PYloplus specimen collected along with path form sent via Dr. Oneida Alar, to respective destination.
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
## 2016-02-16 NOTE — Op Note (Signed)
[facility] Patient Name: Joyce Roberts Procedure Date: 02/16/2016 9:07 AM MRN: [id number] Date of Birth: [birth year] Attending MD: Barney Drain , MD CSN: [id number] Age: 45 Admit Type: Outpatient Procedure:                Upper GI endoscopy WITH COLD FORCEPS BIOPSY Indications:              Epigastric abdominal pain, Abdominal pain in the                            right upper quadrant, Dyspepsia Providers:                Barney Drain, MD, Janeece Riggers, RN, Isabella Stalling,                            Technician Referring MD:             Soyla Dryer PA-C, PA Medicines:                Propofol per Anesthesia Complications:            No immediate complications. Estimated Blood Loss:     Estimated blood loss was minimal. Procedure:                Pre-Anesthesia Assessment:                           - Prior to the procedure, a History and Physical                            was performed, and patient medications and                            allergies were reviewed. The patient's tolerance of                            previous anesthesia was also reviewed. The risks                            and benefits of the procedure and the sedation                            options and risks were discussed with the patient.                            All questions were answered, and informed consent                            was obtained. Prior Anticoagulants: The patient has                            taken ibuprofen. ASA Grade Assessment: II - A                            patient with mild systemic disease. After reviewing  the risks and benefits, the patient was deemed in                            satisfactory condition to undergo the procedure.                            After obtaining informed consent, the endoscope was                            passed under direct vision. Throughout the                            procedure, the patient's blood  pressure, pulse, and                            oxygen saturations were monitored continuously. The                            [id number]) was introduced through the                            mouth, and advanced to the second part of duodenum.                            The upper GI endoscopy was accomplished without                            difficulty. The patient tolerated the procedure                            well. Scope In: 9:35:58 AM Scope Out: 9:41:48 AM Total Procedure Duration: 0 hours 5 minutes 50 seconds  Findings:      The examined esophagus was normal.      Patchy mild inflammation characterized by congestion (edema) and       erythema was found in the gastric antrum. Biopsies were taken with a       cold forceps for Helicobacter pylori testing(PYLOPLUS-1 bX ANTRUM, 1 Bx:       BODY).      The examined duodenum was normal. Impression:               - DYSPEPSIA DUE TO NSAID V. H PYLORI                            Gastritis-DIFFERENTIAL DIAGNOSIS INCLUDES:                            NON-ULDER DYSPEPSA OR SIBO Moderate Sedation:      Per Anesthesia Care Recommendation:           - Low fat diet.                           - Use Prilosec (omeprazole) 20 mg PO daily. ADD  BUSPAR 5 MG TID. STOP IBUPROFEN.                           - Await pathology results. CONSIDER HBT FOR SIBO IF                            SYMPTOMS NOT IMPROVED IN 1 MO.                           - Return to my office in 4 months.                           - Patient has a contact number available for                            emergencies. The signs and symptoms of potential                            delayed complications were discussed with the                            patient. Return to normal activities tomorrow.                            Written discharge instructions were provided to the                            patient.                           - No ibuprofen,  naproxen, or other non-steroidal                            anti-inflammatory drugs. USE TYLENOL IF NEEDED FOR                            PAIN RELIEF. Procedure Code(s):        --- Professional ---                           [phone], Esophagogastroduodenoscopy, flexible,                            transoral; with biopsy, single or multiple Diagnosis Code(s):        --- Professional ---                           K29.70, Gastritis, unspecified, without bleeding                           R10.13, Epigastric pain                           R10.11, Right upper quadrant pain CPT copyright 2016 American Medical Association. All rights reserved. The codes documented in this report are preliminary and upon coder review may  be revised to meet current compliance requirements. Barney Drain, MD Barney Drain, MD 02/16/2016 10:00:39 AM This report has been signed electronically. Number of Addenda: 0

## 2016-02-16 NOTE — Transfer of Care (Signed)
Immediate Anesthesia Transfer of Care Note  Patient: Joyce Roberts  Procedure(s) Performed: Procedure(s) with comments: ESOPHAGOGASTRODUODENOSCOPY (EGD) WITH PROPOFOL (N/A) - biopsy stomach  Patient Location: PACU  Anesthesia Type:MAC  Level of Consciousness: sedated  Airway & Oxygen Therapy: Patient Spontanous Breathing and Patient connected to face mask oxygen  Post-op Assessment: Report given to RN, Post -op Vital signs reviewed and stable and Patient moving all extremities  Post vital signs: Reviewed and stable  Last Vitals:  Vitals:   02/16/16 0910 02/16/16 0915  BP: 125/79 135/79  Pulse:    Resp: 19 11  Temp:      Last Pain:  Vitals:   02/16/16 0803  TempSrc: Oral  PainSc: 1       Patients Stated Pain Goal: 5 ([id number])  Complications: No apparent anesthesia complications

## 2016-02-16 NOTE — Anesthesia Preprocedure Evaluation (Signed)
Anesthesia Evaluation  Patient identified by MRN, date of birth, ID band Patient awake    Reviewed: Allergy & Precautions, H&P , NPO status , Patient's Chart, lab work & pertinent test results  History of Anesthesia Complications (+) PONV and history of anesthetic complications  Airway Mallampati: I  TM Distance: >3 FB Neck ROM: Full    Dental  (+) Teeth Intact   Pulmonary neg pulmonary ROS,    breath sounds clear to auscultation       Cardiovascular negative cardio ROS   Rhythm:Regular Rate:Normal     Neuro/Psych    GI/Hepatic GERD  ,Dyspepsia    Endo/Other    Renal/GU      Musculoskeletal   Abdominal   Peds  Hematology  (+) anemia ,   Anesthesia Other Findings   Reproductive/Obstetrics                             Anesthesia Physical Anesthesia Plan  ASA: II  Anesthesia Plan: MAC   Post-op Pain Management:    Induction: Intravenous  Airway Management Planned: Simple Face Mask  Additional Equipment:   Intra-op Plan:   Post-operative Plan:   Informed Consent: I have reviewed the patients History and Physical, chart, labs and discussed the procedure including the risks, benefits and alternatives for the proposed anesthesia with the patient or authorized representative who has indicated his/her understanding and acceptance.     Plan Discussed with:   Anesthesia Plan Comments:         Anesthesia Quick Evaluation

## 2016-02-16 NOTE — Discharge Instructions (Addendum)
FOLLOW A LOW FAT DIET. AVOID FRIED FOODS. SEE INFO BELOW.  STOP USING IBUPROFEN. USE TYLENOL INSTEAD FOR PAIN RELIEF.  START BUSPAR 5 MG THREE TIMES DAILY.  Add omeprazole 30 mins prior to your first meal.  FOLLOW UP IN 4 MOS.     Dieta restringida en grasas y colesterol (Fat and Cholesterol Restricted Diet) Los niveles altos de grasa y colesterol en la sangre pueden causar varios problemas de salud, como enfermedades del corazn, de los vasos sanguneos, de la [place], del hgado y del pncreas. Las grasas son fuentes de Teacher, early years/pre concentrada que existen en varias formas. Consumir en exceso ciertos tipos de grasa, incluidas las grasas saturadas, puede ser perjudicial. El colesterol es una sustancia que el organismo necesita en pequeas cantidades. El cuerpo [place] todo el colesterol que necesita. El exceso de colesterol proviene de los alimentos que come. Si sus niveles de colesterol y grasas saturadas en la sangre son elevados, puede tener problemas de salud, dado que el exceso de grasa y Research officer, trade union se acumula en las paredes de los vasos sanguneos, provocando su Public librarian. Elegir los Retail buyer su ingesta de grasa y [place]. Esto le ayudar a [employer] de estas sustancias en la sangre dentro de los lmites normales y a reducir el riesgo de Public librarian. EN QU CONSISTE EL PLAN? El mdico le recomienda que:  Limite la ingesta de grasas a alrededor del _______% del total de caloras por da.  Limite la cantidad de colesterol en su dieta a menos de _________mg Darlin Coco.  Consuma entre 28 y 70gramos de [employer]ning [employer]. QU TIPOS DE GRASAS DEBO ELEGIR?  Elija grasas saludables con mayor frecuencia. Elija las grasas monoinsaturadas y poliinsaturadas, como el aceite de oliva y canola, las semillas de [place], las nueces, las almendras y las semillas.  Consuma ms grasas omega-3. Las mejores opciones incluyen salmn, caballa,  sardinas, atn, aceite de lino y semillas de lino molidas. Trate de consumir pescado al [employer] veces por semana.  Limite el consumo de grasas saturadas. Estas se encuentran principalmente en los productos de origen animal, como las carnes, la [place] y la crema. Las grasas saturadas de origen vegetal incluyen aceite de palma, de palmiste y de coco.  Evite los alimentos con aceites parcialmente hidrogenados. Estos contienen grasas trans. Entre los ejemplos de alimentos con grasas trans se incluyen margarinas en barra, algunas margarinas untables, galletas dulces o saladas y otros productos horneados. [place]? Estas pautas para una alimentacin saludable le ayudarn a controlar su ingesta de grasa y colesterol:  Lea las etiquetas de los alimentos detenidamente para identificar los que contienen grasas trans o altas cantidades de grasas saturadas.  Llene la mitad del plato con verduras y ensaladas de hojas verdes.  Llene un cuarto del plato con cereales integrales. Busque la palabra "integral" en Equities trader de la lista de ingredientes.  Llene un cuarto del plato con alimentos con protenas magras.  [place] a dos porciones por da. Elija frutas en lugar de jugos.  Coma ms alimentos que contienen fibra, como [place], [place], [place], frijoles, guisantes y [place].  Consuma ms comida casera y menos de restaurante, de buf y comida rpida.  Limite o evite el alcohol.  Limite los alimentos con alto contenido de almidn y Location manager.  Limite el consumo de alimentos fritos.  Cocine los alimentos utilizando mtodos que no sean la fritura. Las opciones de coccin ms [place] son Teacher, music, Adult nurse, Interior and spatial designer y  asar a la parrilla.  Baje de peso si es necesario. Perder solo del 5 al 10% de su peso inicial puede ayudarle a mejorar su estado de salud general y a Producer, television/film/video, como la diabetes y las enfermedades cardacas. QU ALIMENTOS PUEDO  COMER? Cereales  Cereales integrales, como los panes de salvado o [place], las [place], los cereales y las pastas. Avena sin endulzar, trigo, [place], quinua o arroz integral. Tortillas de harina de maz o de salvado. Verduras  Verduras frescas o congeladas (crudas, al vapor, asadas o grilladas). Ensaladas de hojas verdes. Lambert Mody  Lambert Mody frescas, en conserva (en su jugo natural) o frutas congeladas. Carnes y otros productos con protenas  Carne de res molida (al 85% o ms [place]), carne de res de animales alimentados con pastos o carne de res sin la grasa. Pollo o pavo sin piel. Carne de pollo o de [place]. Cerdo sin la grasa. Todos los pescados y frutos de mar. Huevos. Porotos, guisantes o lentejas secos. Frutos secos o semillas sin sal. Frijoles secos o en lata sin sal. Lcteos  Productos lcteos con bajo contenido de grasas, como [place] o al 1%, quesos reducidos en grasas o al 2%, ricota con bajo contenido de grasas o [employer], o yogur natural con bajo contenido de [place]. Grasas y [employer] untables que no contengan grasas trans. [place] y condimentos para ensaladas livianos o reducidos en grasas. [place]. Aceites de oliva, canola, ssamo o crtamo. [place] natural de cacahuate o almendra (elija la que no tenga agregado de aceite o azcar). Los artculos mencionados arriba pueden no ser [employer] Foods Company de las bebidas o los alimentos recomendados. Comunquese con el nutricionista para conocer ms opciones.  QU ALIMENTOS NO SE RECOMIENDAN? Cereales  Pan blanco. Pastas blancas. Arroz blanco. Pan de maz. Bagels, pasteles y croissants. Galletas saladas que contengan grasas trans. Verduras  Papas blancas. [place] con crema o fritas. Verduras en [place]. Lambert Mody  Frutas secas. Fruta enlatada en almbar liviano o espeso. Jugo de frutas. Carnes y otros productos con protenas  Cortes de carne con Lobbyist. Costillas, alas de pollo, tocineta,  salchicha, mortadela, salame, chinchulines, tocino, perros calientes, salchichas alemanas y embutidos envasados. Hgado y otros rganos. Lcteos  Leche entera o al 2%, crema, mezcla de [place] y crema, y queso crema. Quesos enteros. Yogur entero o endulzado. Quesos con toda su grasa. Cremas no lcteas y coberturas batidas. Quesos procesados, quesos para untar o cuajadas. Dulces y postres  Jarabe de maz, azcares, miel y Control and instrumentation engineer. Caramelos. Mermelada y [place]. Chrissie Noa. Cereales endulzados. Galletas, pasteles, bizcochuelos, donas, muffins y helado. Grasas y aceites  [place], [place] en barra, [place] de [place], [place], [place] clarificada o grasa de tocino. Aceites de coco, de palmiste o de palma. Bebidas  Alcohol. Bebidas endulzadas (como refrescos, limonadas y bebidas frutales o ponches). Los artculos mencionados arriba pueden no ser Dean Foods Company de las bebidas y los alimentos que se Higher education careers adviser. Comunquese con el nutricionista para obtener ms informacin.  Esta informacin no tiene Marine scientist el consejo del mdico. Asegrese de hacerle al mdico cualquier pregunta que tenga. Document Released: 03/14/2005 Document Revised: 04/04/2014 Document Reviewed: 06/12/2013 Elsevier Interactive Patient Education  2017 Elsevier Inc.  Gastritis en los adultos (Gastritis, Adult) La gastritis es la irritacin del [place]. Hay dos tipos de gastritis:  Gastritis aguda. Este tipo aparece de manera repentina.  Gastritis crnica. Este tipo dura [employer]. La gastritis aparece cuando la membrana que recubre el estmago se debilita  o se daa. Sin tratamiento, la gastritis puede causar hemorragias y lceras estomacales. CAUSAS Esta afeccin puede ser causada por lo siguiente:  Una infeccin.  Beber alcohol en exceso.  Ciertos medicamentos.  Tener demasiada cantidad de cido [employer].  Una enfermedad de los intestinos o del [place].  Estrs. SNTOMAS Los sntomas de  esta afeccin incluyen lo siguiente:  Dolor o ardor en la parte superior del abdomen.  Nuseas.  Vmitos.  Sensacin molesta de distensin despus de comer. En algunos casos no hay sntomas. DIAGNSTICO Esta afeccin se puede diagnosticar a travs de lo siguiente:  Una descripcin de los sntomas.  Un examen fsico.  Estudios. Estos pueden incluir los siguientes:  Anlisis de [place].  Pruebas de materia fecal.  Ardelia Mems prueba en la que se introduce un instrumento delgado y flexible que tiene una luz y Ardelia Mems cmara en la punta a travs del esfago y [place] (endoscopia superior).  Una prueba en la que se toma una muestra de tejido para Web designer (biopsia). TRATAMIENTO Esta afeccin puede tratarse con medicamentos. Si la afeccin es causada por una infeccin bacteriana, pueden darle antibiticos. Si es causada por demasiada cantidad de cido en el estmago, pueden darle medicamentos llamados bloqueadores H2, inhibidores de la bomba de protones o anticidos. El tratamiento tambin puede incluir la suspensin del uso de ciertos medicamentos, como la aspirina, el ibuprofeno u otros antiinflamatorios no esteroides (AINE). Garber los medicamentos de venta libre y los recetados solamente como se lo haya indicado el mdico.  Si le recetaron un antibitico, tmelo como se lo haya indicado el mdico. No deje de tomar los antibiticos aunque comience a Sports administrator.  Beba suficiente lquido para Consulting civil engineer orina clara o de color amarillo plido.  Haga varias comidas pequeas y frecuentes Medical sales representative de comidas abundantes. SOLICITE ATENCIN MDICA SI:  Los sntomas empeoran.  Los sntomas regresan despus del tratamiento. [place] DE INMEDIATO SI:  Vomita sangre de color rojo brillante o una sustancia similar a los granos de caf.  La materia fecal es negra o de color rojo oscuro.  No puede retener los  lquidos.  El dolor abdominal empeora.  Tiene fiebre.  No mejora luego de 1 semana. Esta informacin no tiene Marine scientist el consejo del mdico. Asegrese de hacerle al mdico cualquier pregunta que tenga. Document Released: 12/22/2004 Document Revised: 12/03/2014 Document Reviewed: 12/06/2014 Elsevier Interactive Patient Education  2017 [place], cuidados posteriores (Esophagogastroduodenoscopy, Care After) Siga estas instrucciones durante las prximas semanas. Estas indicaciones le proporcionan informacin acerca de cmo deber cuidarse despus del procedimiento. El mdico tambin podr darle instrucciones ms especficas. El tratamiento ha sido planificado segn las prcticas mdicas actuales, pero en algunos casos pueden ocurrir problemas. Comunquese con el mdico si tiene algn problema o dudas despus del procedimiento. QU ESPERAR DESPUS DEL PROCEDIMIENTO Despus del procedimiento, es comn Abbott Laboratories siguientes sntomas:  Dolor de Investment banker, operational.  Nuseas.  Meteorismo.  Mareos.  Fatiga. INSTRUCCIONES PARA EL CUIDADO EN EL HOGAR  No coma ni beba nada hasta que el efecto del anestsico (anestesia local) haya desaparecido y vuelva a tener reflejo farngeo. Si puede tragar con comodidad, significa que el efecto de la anestesia local ha desaparecido.  No conduzca durante 24horas si le dieron un medicamento para ayudarlo a que se relaje (sedante).  Si durante el procedimiento el mdico tom [place] de tejido para Personnel officer, asegrese de The [employer] Companies de  la prueba. Esta es su responsabilidad. Consulte a su mdico o en el departamento donde se realice el estudio cundo estarn Praxair.  Concurra a todas las visitas de control como se lo haya indicado el mdico. Esto es importante. SOLICITE ATENCIN MDICA SI:  No puede dejar de toser.  No puede orinar.  Orina menos que lo habitual. SOLICITE ATENCIN MDICA DE  INMEDIATO SI:  Presenta dificultad para tragar.  No puede comer ni beber.  Tiene dolor de garganta o de pecho que [place].  Se siente mareado o sufre un desmayo.  Se desmaya.  Tiene nuseas o vmitos.  Tiene escalofros.  Tiene fiebre.  Siente un dolor abdominal intenso.  La materia fecal es negra, de aspecto alquitranado o sanguinolenta. Esta informacin no tiene Marine scientist el consejo del mdico. Asegrese de hacerle al mdico cualquier pregunta que tenga. Document Released: 07/09/2012 Document Revised: 07/06/2015 Document Reviewed: 02/05/2015 Elsevier Interactive Patient Education  2017 [place] have mild gastritis. I biopsied your stomach.   UPPER ENDOSCOPY AFTER CARE Read the instructions outlined below and refer to this sheet in the next week. These discharge instructions provide you with general information on caring for yourself after you leave the hospital. While your treatment has been planned according to the most current medical practices available, unavoidable complications occasionally occur. If you have any problems or questions after discharge, call DR. Toma Arts, [phone].  ACTIVITY  You may resume your regular activity, but move at a slower pace for the next 24 hours.   Take frequent rest periods for the next 24 hours.   Walking will help get rid of the air and reduce the bloated feeling in your belly (abdomen).   No driving for 24 hours (because of the medicine (anesthesia) used during the test).   You may shower.   Do not sign any important legal documents or operate any machinery for 24 hours (because of the anesthesia used during the test).    NUTRITION  Drink plenty of fluids.   You may resume your normal diet as instructed by your doctor.   Begin with a light meal and progress to your normal diet. Heavy or fried foods are harder to digest and may make you feel sick to your stomach (nauseated).   Avoid alcoholic beverages  for 24 hours or as instructed.    MEDICATIONS  You may resume your normal medications.   WHAT YOU CAN EXPECT TODAY  Some feelings of bloating in the abdomen.   Passage of more gas than usual.    IF YOU HAD A BIOPSY TAKEN DURING THE UPPER ENDOSCOPY:  Eat a soft diet IF YOU HAVE NAUSEA, BLOATING, ABDOMINAL PAIN, OR VOMITING.    FINDING OUT THE RESULTS OF YOUR TEST Not all test results are available during your visit. DR. Oneida Alar WILL CALL YOU WITHIN 14 DAYS OF YOUR PROCEDUE WITH YOUR RESULTS. Do not assume everything is normal if you have not heard from DR. Laren Orama, CALL HER OFFICE AT [phone].  SEEK IMMEDIATE MEDICAL ATTENTION AND CALL THE OFFICE: [phone] IF:  You have more than a spotting of blood in your stool.   Your belly is swollen (abdominal distention).   You are nauseated or vomiting.   You have a temperature over 101F.   You have abdominal pain or discomfort that is severe or gets worse throughout the day.

## 2016-02-22 ENCOUNTER — Encounter (HOSPITAL_COMMUNITY): Payer: Self-pay | Admitting: Gastroenterology

## 2016-03-10 ENCOUNTER — Telehealth: Payer: Self-pay | Admitting: Gastroenterology

## 2016-03-10 NOTE — Telephone Encounter (Signed)
I called and spoke to pt's daughter, Joyce Roberts, who comes with her to the office visits. She said pt is complaining with a little abdominal pain, but feels full after she eats small amounts of food. She also feels constipated, she has a little BM but feels like she cannot get rid of all of stool. She only has about 2 Bm's a week.  She has a Bm this Am and it was soft.  Routing to Neil Crouch, PA who saw pt in the office last.

## 2016-03-10 NOTE — Telephone Encounter (Signed)
Pt had procedure done recently by [facility] and isn't feeling any better. She's having abdominal pain. Please advise. [phone]

## 2016-03-10 NOTE — Telephone Encounter (Signed)
I cannot find the pathology results or H.pylori sensitivity testing performed last month at time of EGD. PLEASE LET DR. FIELDS KNOW AND TRY TO TRACK THIS DOWN.  Will address constipation, miralax 17g bid until regular BM then drop to daily as needed.   Regarding UGI symptoms, need missing results before decisions can be made.

## 2016-03-14 ENCOUNTER — Other Ambulatory Visit: Payer: Self-pay

## 2016-03-14 DIAGNOSIS — R1013 Epigastric pain: Secondary | ICD-10-CM

## 2016-03-14 NOTE — Telephone Encounter (Signed)
Referral has been made.

## 2016-03-14 NOTE — Telephone Encounter (Signed)
Pt's daughter, [name], is aware to have pt do the Miralax bid until she has good Bm, then daily as needed.  She is also aware of the info about the the test and the reason we got no results.  OK to refer to [facility] for second opinion.

## 2016-03-14 NOTE — Telephone Encounter (Signed)
PLEASE CALL PT. HER RESULTS ARE NO AVAILABLE BECAUSE THE COMPANY WE SENT HER SPECIMEN TO STOPPED PERFORMING THE EXAM. WE WERE NOT INFORMED ABOUT THIS UNTIL WE SENT HERS SPECIMEN. SHE SHOULD BE REFERRED TO [facility] FOR A SECOND OPINION REGARDING HER DYSPEPSIA AND H PYLORI GASTRITIS. WE HAVE NO OTHER DIAGNOSTIC OPTIONS LEFT.

## 2016-03-24 ENCOUNTER — Telehealth: Payer: Self-pay

## 2016-03-24 NOTE — Telephone Encounter (Signed)
Called [facility] this morning to follow-up on referral. Appt was scheduled for 06/20/16 at 3:30 pm with Dr. Dewaine Oats. Financial Counseling at [facility] was to contact pt due to no insurance. Referral info was refaxed. [facility] later sent fax and stated "patient not compliant with financial counseling."

## 2016-03-24 NOTE — Telephone Encounter (Signed)
REVIEWED-NO ADDITIONAL RECOMMENDATIONS. 

## 2016-04-20 ENCOUNTER — Encounter: Payer: Self-pay | Admitting: Physician Assistant

## 2016-04-20 ENCOUNTER — Ambulatory Visit: Payer: Self-pay | Admitting: Physician Assistant

## 2016-04-20 VITALS — BP 130/76 | HR 72 | Temp 98.2°F | Ht 60.0 in | Wt 134.5 lb

## 2016-04-20 DIAGNOSIS — D649 Anemia, unspecified: Secondary | ICD-10-CM

## 2016-04-20 DIAGNOSIS — R6889 Other general symptoms and signs: Secondary | ICD-10-CM

## 2016-04-20 DIAGNOSIS — R1013 Epigastric pain: Secondary | ICD-10-CM

## 2016-04-20 LAB — CBC WITH DIFFERENTIAL/PLATELET
Basophils Absolute: 0 cells/uL (ref 0–200)
Basophils Relative: 0 %
EOS ABS: 0 {cells}/uL — AB (ref 15–500)
Eosinophils Relative: 0 %
HEMATOCRIT: 38.1 % (ref 35.0–45.0)
Hemoglobin: 12.4 g/dL (ref 11.7–15.5)
LYMPHS PCT: 7 %
Lymphs Abs: 861 cells/uL (ref 850–3900)
MCH: 27.1 pg (ref 27.0–33.0)
MCHC: 32.5 g/dL (ref 32.0–36.0)
MCV: 83.4 fL (ref 80.0–100.0)
MONO ABS: 861 {cells}/uL (ref 200–950)
MPV: 9.3 fL (ref 7.5–12.5)
Monocytes Relative: 7 %
Neutro Abs: 10578 cells/uL — ABNORMAL HIGH (ref 1500–7800)
Neutrophils Relative %: 86 %
Platelets: 250 10*3/uL (ref 140–400)
RBC: 4.57 MIL/uL (ref 3.80–5.10)
RDW: 15.6 % — AB (ref 11.0–15.0)
WBC: 12.3 10*3/uL — ABNORMAL HIGH (ref 3.8–10.8)

## 2016-04-20 NOTE — Patient Instructions (Addendum)
[place] [phone]   Infeccin del tracto respiratorio superior, adultos (Upper Respiratory Infection, Adult) La mayora de las infecciones del tracto respiratorio superior son infecciones virales de las vas que llevan el aire a los pulmones. Un infeccin del tracto respiratorio superior afecta la nariz, la garganta y las vas respiratorias superiores. El tipo ms frecuente de infeccin del tracto respiratorio superior es la nasofaringitis, que habitualmente se conoce como "resfro comn". Las infecciones del tracto respiratorio superior siguen su curso y por lo general se curan solas. En la [employer], la infeccin del tracto respiratorio superior no requiere atencin [place], Armed forces training and education officer a veces, despus de una infeccin viral, puede surgir una infeccin bacteriana en las vas respiratorias superiores. Esto se conoce como infeccin secundaria. Las infecciones sinusales y en el odo medio son tipos frecuentes de infecciones secundarias en el tracto respiratorio superior. La neumona bacteriana tambin puede complicar un cuadro de infeccin del tracto respiratorio superior. Este tipo de infeccin puede empeorar el asma y la enfermedad pulmonar obstructiva crnica (EPOC). En algunos casos, estas complicaciones pueden requerir atencin mdica de emergencia y poner en peligro la vida. CAUSAS Casi todas las infecciones del tracto respiratorio superior se deben a los virus. Un virus es un tipo de microbio que puede contagiarse de Ardelia Mems persona a Theatre manager. FACTORES DE RIESGO Puede estar en riesgo de sufrir una infeccin del tracto respiratorio superior si:  Fuma.  Tiene una enfermedad pulmonar o cardaca crnica.  Tiene debilitado el sistema de defensa (inmunitario) del cuerpo.  Es muy joven o de edad muy [place].  Tiene asma o alergias nasales.  Trabaja en reas donde hay mucha gente o poca ventilacin.  Governor Rooks en una escuela o en un centro de atencin  mdica. SIGNOS Y SNTOMAS Habitualmente, los sntomas aparecen de 2a 3das despus de entrar en contacto con el virus del resfro. La mayora de las infecciones virales en el tracto respiratorio superior duran de 7a 10das. Sin embargo, las infecciones virales en el tracto respiratorio superior a causa del virus de la gripe pueden durar de 14a 18das y, habitualmente, son ms graves. Entre los sntomas se pueden incluir los siguientes:  Secrecin o congestin nasal.  Estornudos.  Tos.  Dolor de Investment banker, operational.  Dolor de [place].  Fatiga.  Cristy Hilts.  Prdida del apetito.  Dolor en la frente, detrs de los ojos y por encima de los pmulos (dolor sinusal).  Dolores musculares. DIAGNSTICO El mdico puede diagnosticar una infeccin del tracto respiratorio superior mediante los siguientes estudios:  Examen fsico.  Pruebas para verificar si los sntomas no se deben a otra afeccin, por ejemplo:  Faringitis estreptoccica.  Sinusitis.  Neumona.  Asma. TRATAMIENTO Esta infeccin desaparece sola, con el tiempo. No puede curarse con medicamentos, pero a menudo se prescriben para aliviar los sntomas. Los medicamentos pueden ser tiles para lo siguiente:  Engineer, materials fiebre.  Reducir la tos.  Aliviar la congestin nasal. INSTRUCCIONES PARA EL CUIDADO EN EL HOGAR  Tome los medicamentos solamente como se lo haya indicado el mdico.  A fin de Best boy de garganta, haga grgaras con solucin salina templada o consuma caramelos para la tos, como se lo haya indicado el mdico.  Use un humidificador de vapor clido o inhale el vapor de la ducha para aumentar la humedad del aire. Esto facilitar la respiracin.  Beba suficiente lquido para Consulting civil engineer orina clara o de color amarillo plido.  Consuma sopas y otros caldos transparentes, y [employer].  Descanse todo  lo que sea necesario.  Regrese al [place] cuando la temperatura se le haya normalizado o cuando el mdico  lo autorice. Es posible que deba quedarse en su casa durante un tiempo prolongado, para no infectar a los dems. [place] usar un barbijo y lavarse las manos con cuidado para Mining engineer propagacin del virus.  Aumente el uso del inhalador si tiene asma.  No consuma ningn producto que contenga tabaco, lo que incluye cigarrillos, tabaco de Higher education careers adviser o Psychologist, sport and exercise. Si necesita ayuda para dejar de fumar, consulte al [employer]. PREVENCIN La mejor manera de protegerse de un resfro es mantener una higiene [place].  Evite el contacto oral o fsico con personas que tengan sntomas de resfro.  En caso de contacto, lvese las manos con frecuencia. No hay pruebas claras de que la vitaminaC, la vitaminaE, la equincea o el ejercicio reduzcan la probabilidad de Museum/gallery curator un resfro. Sin embargo, siempre se recomienda Scientific laboratory technician, hacer ejercicio y Ecologist. SOLICITE ATENCIN MDICA SI:  Su estado empeora en lugar de mejorar.  Los medicamentos no Animator.  Tiene escalofros.  La sensacin de falta de aire empeora.  Tiene mucosidad marrn o roja.  Tiene secrecin nasal amarilla o marrn.  Le duele la cara, especialmente al inclinarse hacia adelante.  Tiene fiebre.  Tiene los ganglios del cuello hinchados.  Siente dolor al tragar.  Tiene zonas blancas en la parte de atrs de la garganta. SOLICITE ATENCIN MDICA DE INMEDIATO SI:  Tiene sntomas intensos o persistentes de:  Dolor de [place].  Dolor de odos.  Dolor sinusal.  Dolor en el pecho.  Tiene enfermedad pulmonar crnica y cualquiera de estos sntomas:  Sibilancias.  Tos prolongada.  Tos con sangre.  Cambio en la mucosidad habitual.  Presenta rigidez en el cuello.  Tiene cambios en:  La visin.  La audicin.  El pensamiento.  El [place] de nimo. ASEGRESE DE QUE:  Comprende estas instrucciones.  Controlar su afeccin.  Recibir ayuda de inmediato si no  mejora o si empeora. Esta informacin no tiene Marine scientist el consejo del mdico. Asegrese de hacerle al mdico cualquier pregunta que tenga. Document Released: 12/22/2004 Document Revised: 07/29/2014 Document Reviewed: 06/19/2013 Elsevier Interactive Patient Education  2017 [employer].

## 2016-04-20 NOTE — Progress Notes (Signed)
BP 130/76 (BP Location: Left Arm, Patient Position: Sitting, Cuff Size: Normal)   Pulse 72   Temp 98.2 F (36.8 C) (Other (Comment))   Ht 5' (1.524 m)   Wt 134 lb 8 oz (61 kg)   SpO2 98%   BMI 26.27 kg/m    Subjective:    Patient ID: Joyce Roberts, female    DOB: 1971-02-10, 46 y.o.   MRN: [id number]  HPI: Joyce Roberts is a 46 y.o. female presenting on 04/20/2016 for Anemia and Sore Throat (c/o, for the last 10 days, has had sore throat, headache, pain in both ears, more in left ear, and green congestion, runny nose , fever and body aches)   HPI   Pt had EGD in November.  Dr Oneida Alar referred her to [facility] and she has appt in March in EMR but pt states she is unaware of any appt.  Pt says she was told she would get packet from financial office a month ago but she hasn't received it yet.  Pt was unaware that she had f/u apppt with [name] in March  Pt states feeling tired and dizzy even before she got sick.   Pt stopped her iron over 3 weeks ago.  Just because is the reason she gave for stopping it.   Sick x 10 days.  Last night Temp 101.  Worst symptom is ST.  C/o lots of congestion.  No trouble breathing.  T max 101.  Pt states grandson had flu about 2 week ago, just prior to her starting to feel bad.   Relevant past medical, surgical, family and social history reviewed and updated as indicated. Interim medical history since our last visit reviewed. Allergies and medications reviewed and updated.  CURRENT MEDS:  Robitussin Tylenol IBU  Review of Systems  Constitutional: Positive for chills, diaphoresis, fatigue and fever. Negative for appetite change and unexpected weight change.  HENT: Positive for congestion, dental problem, ear pain and sore throat. Negative for drooling, facial swelling, hearing loss, mouth sores, sneezing, trouble swallowing and voice change.   Eyes: Negative for pain, discharge, redness, itching and visual disturbance.  Respiratory: Positive  for cough. Negative for choking, shortness of breath and wheezing.   Cardiovascular: Negative for chest pain, palpitations and leg swelling.  Gastrointestinal: Positive for constipation and vomiting (post tussive emesis). Negative for abdominal pain, blood in stool and diarrhea.  Endocrine: Negative for cold intolerance, heat intolerance and polydipsia.  Genitourinary: Negative for decreased urine volume, dysuria and hematuria.  Musculoskeletal: Positive for arthralgias and back pain. Negative for gait problem.  Skin: Negative for rash.  Allergic/Immunologic: Positive for environmental allergies.  Neurological: Positive for headaches. Negative for seizures, syncope and light-headedness.  Hematological: Negative for adenopathy.  Psychiatric/Behavioral: Negative for agitation, dysphoric mood and suicidal ideas. The patient is not nervous/anxious.     Per HPI unless specifically indicated above     Objective:    BP 130/76 (BP Location: Left Arm, Patient Position: Sitting, Cuff Size: Normal)   Pulse 72   Temp 98.2 F (36.8 C) (Other (Comment))   Ht 5' (1.524 m)   Wt 134 lb 8 oz (61 kg)   SpO2 98%   BMI 26.27 kg/m   Wt Readings from Last 3 Encounters:  04/20/16 134 lb 8 oz (61 kg)  02/16/16 135 lb (61.2 kg)  02/11/16 135 lb (61.2 kg)    Physical Exam  Constitutional: She is oriented to person, place, and time. She appears well-developed and well-nourished.  HENT:  Head: Normocephalic and atraumatic.  Right Ear: Hearing, tympanic membrane, external ear and ear canal normal.  Left Ear: Hearing, tympanic membrane, external ear and ear canal normal.  Nose: Nose normal.  Mouth/Throat: Uvula is midline and oropharynx is clear and moist. No oropharyngeal exudate.  Neck: Neck supple.  Cardiovascular: Normal rate and regular rhythm.   Pulmonary/Chest: Effort normal and breath sounds normal. She has no wheezes.  Abdominal: Soft. Bowel sounds are normal. She exhibits no mass. There is no  hepatosplenomegaly. There is no tenderness.  Musculoskeletal: She exhibits no edema.  Lymphadenopathy:    She has no cervical adenopathy.  Neurological: She is alert and oriented to person, place, and time.  Skin: Skin is warm and dry.  Psychiatric: She has a normal mood and affect. Her behavior is normal.  Vitals reviewed.       Assessment & Plan:   Encounter Diagnoses  Name Primary?  . Flu-like symptoms Yes  . Anemia, unspecified type   . Dyspepsia      -Told pt to contact financial aid office at [facility] about her packet that was mailed a month ago.  She was given phone number -pt counseled to restart iron -counseled on symptomatic control of flu-like illness including rest, fluids, tylenol or ibuprofen. Gave handout -check cbc today when leaves office -reminded pt that her paperwork today will list her next appointment here as well as her next scheduled appt with [facility] -follow up in 2 months.  RTO sooner prn worsening or new symptoms

## 2016-04-21 ENCOUNTER — Encounter: Payer: Self-pay | Admitting: Obstetrics and Gynecology

## 2016-04-21 ENCOUNTER — Ambulatory Visit (INDEPENDENT_AMBULATORY_CARE_PROVIDER_SITE_OTHER): Payer: Self-pay | Admitting: Obstetrics and Gynecology

## 2016-04-21 ENCOUNTER — Other Ambulatory Visit: Payer: Self-pay | Admitting: Physician Assistant

## 2016-04-21 ENCOUNTER — Other Ambulatory Visit: Payer: Self-pay | Admitting: Obstetrics and Gynecology

## 2016-04-21 ENCOUNTER — Ambulatory Visit (HOSPITAL_COMMUNITY)
Admission: RE | Admit: 2016-04-21 | Discharge: 2016-04-21 | Disposition: A | Discharge status: Home / Self Care | Payer: Self-pay | Source: Ambulatory Visit | Attending: Physician Assistant | Admitting: Physician Assistant

## 2016-04-21 VITALS — BP 112/80 | HR 84 | Wt 136.8 lb

## 2016-04-21 DIAGNOSIS — R6889 Other general symptoms and signs: Secondary | ICD-10-CM

## 2016-04-21 DIAGNOSIS — R059 Cough, unspecified: Secondary | ICD-10-CM

## 2016-04-21 DIAGNOSIS — R05 Cough: Secondary | ICD-10-CM

## 2016-04-21 DIAGNOSIS — N841 Polyp of cervix uteri: Secondary | ICD-10-CM

## 2016-04-21 DIAGNOSIS — R509 Fever, unspecified: Secondary | ICD-10-CM

## 2016-04-21 DIAGNOSIS — R102 Pelvic and perineal pain: Secondary | ICD-10-CM

## 2016-04-21 DIAGNOSIS — N898 Other specified noninflammatory disorders of vagina: Secondary | ICD-10-CM

## 2016-04-21 MED ORDER — AZITHROMYCIN 250 MG PO TABS: ORAL_TABLET | ORAL | 0 refills | Status: DC
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
## 2016-04-27 ENCOUNTER — Ambulatory Visit (INDEPENDENT_AMBULATORY_CARE_PROVIDER_SITE_OTHER): Payer: Self-pay | Admitting: Obstetrics and Gynecology

## 2016-04-27 ENCOUNTER — Encounter: Payer: Self-pay | Admitting: Obstetrics and Gynecology

## 2016-04-27 VITALS — BP 120/70 | HR 72 | Wt 137.0 lb

## 2016-04-27 DIAGNOSIS — N841 Polyp of cervix uteri: Secondary | ICD-10-CM
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
## 2016-06-15 ENCOUNTER — Other Ambulatory Visit: Payer: Self-pay | Admitting: Physician Assistant

## 2016-06-15 ENCOUNTER — Encounter: Payer: Self-pay | Admitting: Gastroenterology

## 2016-06-15 ENCOUNTER — Ambulatory Visit (INDEPENDENT_AMBULATORY_CARE_PROVIDER_SITE_OTHER): Payer: Self-pay | Admitting: Gastroenterology

## 2016-06-15 VITALS — BP 119/72 | HR 70 | Temp 97.4°F | Ht 60.0 in | Wt 135.8 lb

## 2016-06-15 DIAGNOSIS — K2970 Gastritis, unspecified, without bleeding: Secondary | ICD-10-CM

## 2016-06-15 DIAGNOSIS — K5900 Constipation, unspecified: Secondary | ICD-10-CM

## 2016-06-15 DIAGNOSIS — B9681 Helicobacter pylori [H. pylori] as the cause of diseases classified elsewhere: Secondary | ICD-10-CM

## 2016-06-15 DIAGNOSIS — D508 Other iron deficiency anemias: Secondary | ICD-10-CM

## 2016-06-15 MED ORDER — PANTOPRAZOLE SODIUM 40 MG PO TBEC: 40.0000 mg | DELAYED_RELEASE_TABLET | Freq: Every day | ORAL | 3 refills | Status: DC

## 2016-06-15 NOTE — Assessment & Plan Note (Signed)
Start Linzess 145 mcg once daily, samples provided. Patient to call if this works well.

## 2016-06-15 NOTE — Assessment & Plan Note (Signed)
Unable to tolerate oral iron. Anemia secondary to H.pylori gastritis. Recent Hgb improved. Check iron, ferritin, TIBC now.
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
## 2016-06-15 NOTE — Assessment & Plan Note (Signed)
Failure of 2 regimens. EGD recently but unable to process pathology as we were unaware the company had stopped doing this. Referred to [facility] but did not complete patient assistance due to miscommunication. We have printed off the patient assistance forms for her so that she may complete this. Start Protonix 40 mg once daily, as she is not on a PPI. Return in 3 months with Dr. Oneida Alar.

## 2016-06-15 NOTE — Patient Instructions (Signed)
We have given you forms to fill out for [facility].   I have printed the prescription for Protonix to take once each day, 30 minutes before breakfast.   Please trial Linzess 1 capsule each morning, 30 minutes before breakfast.   Please have blood work done today.   We will see you in 3 months.    Le hemos dado formularios para completar para [facility].  Imprim la receta de Protonix para tomar una vez al da, 30 minutos antes del desayuno.  Pruebe Linzess 1 cpsula cada maana, 30 minutos antes del desayuno.  Por favor haga un anlisis de [employer].  Te veremos en 3 meses.

## 2016-06-15 NOTE — Progress Notes (Signed)
CC'ED TO PCP 

## 2016-06-16 ENCOUNTER — Encounter: Payer: Self-pay | Admitting: Physician Assistant

## 2016-06-16 ENCOUNTER — Ambulatory Visit: Payer: Self-pay | Admitting: Physician Assistant

## 2016-06-16 VITALS — BP 128/80 | HR 69 | Temp 97.7°F | Ht 60.0 in | Wt 136.0 lb

## 2016-06-16 DIAGNOSIS — Z1239 Encounter for other screening for malignant neoplasm of breast: Secondary | ICD-10-CM

## 2016-06-16 DIAGNOSIS — K5900 Constipation, unspecified: Secondary | ICD-10-CM

## 2016-06-16 DIAGNOSIS — R5383 Other fatigue: Secondary | ICD-10-CM

## 2016-06-16 DIAGNOSIS — R1084 Generalized abdominal pain: Secondary | ICD-10-CM

## 2016-06-16 DIAGNOSIS — D649 Anemia, unspecified: Secondary | ICD-10-CM

## 2016-06-16 LAB — IRON AND TIBC
%SAT: 12 % (ref 11–50)
IRON: 56 ug/dL (ref 40–190)
TIBC: 473 ug/dL — ABNORMAL HIGH (ref 250–450)
UIBC: 417 ug/dL — AB (ref 125–400)

## 2016-06-16 LAB — FERRITIN: FERRITIN: 8 ng/mL — AB (ref 10–232)

## 2016-06-16 NOTE — Patient Instructions (Addendum)
Joyce Roberts  [phone]         Dieta con alto contenido de hierro (Iron-Rich Diet) El hierro es un mineral que ayuda al organismo a producir hemoglobina. La hemoglobina es una protena de los glbulos rojos que transporta el oxgeno a los tejidos del cuerpo. Consumir muy poco hierro [employer] se sienta dbil y [place], y aumentar su riesgo de contraer infecciones. Consumir la cantidad suficiente de hierro es necesario para el metabolismo del cuerpo, el funcionamiento muscular y el [place]. El hierro es un componente natural de muchos alimentos. Tambin puede agregarse a los alimentos, o bien los alimentos pueden fortificarse con hierro. Hay dos tipos de hierro de los alimentos:  Hierro hemo. El organismo absorbe el hierro hemo con mayor facilidad que el no hemo. La carne de res, de ave y de pescado contienen hierro hemo.  Hierro no hemo. Se encuentra en los complementos alimenticios, los cereales fortificados con hierro, los frijoles y las verduras. Es posible que deba seguir una dieta con alto contenido de hierro en los siguientes casos:  Si le han diagnosticado una deficiencia de hierro o anemia por deficiencia de hierro.  Si tiene una enfermedad que le impide absorber el hierro de los alimentos, por ejemplo:  Infecciones intestinales.  Enfermedad celaca. Esto incluye la inflamacin permanente (crnica) de los intestinos.  No consume suficiente hierro.  Consume una dieta que incluye muchos alimentos que afectan la absorcin de hierro.  Ha perdido [employer].  Tiene sangrado abundante cuando [place].  Est embarazada. EN QU CONSISTE EL PLAN? El mdico puede ayudarlo a Office manager cantidad de hierro que necesita a diario en funcin de su cuadro clnico. Por lo general, Laroy Apple persona consume cantidades suficientes de hierro en la dieta, se satisfacen las siguientes necesidades de hierro:  Hombres.  De 14 a  18aos: 11mg  al da.  De 19 a 50aos: 8mg  al da.  Mujeres.  De 14 a 18aos: 15mg  al da.  De 19 a 50aos: 18mg  al da.  Mayores de 50aos: 8mg  al da.  Embarazadas: 27mg  al da.  Mujeres en perodo de lactancia: 9mg  al da. QU DEBO SABER ACERCA DE LA DIETA CON ALTO CONTENIDO DE HIERRO?  Consuma frutas y verduras frescas con alto contenido de vitaminaC junto con alimentos ricos en hierro. Esto ayudar a aumentar la cantidad de hierro que el cuerpo absorbe de los alimentos, en especial, con los que contienen hierro no hemo. Entre los alimentos con alto contenido de vitaminaC se incluyen las [place], los pimientos morrones, los tomates y Education officer, environmental.  Tome los suplementos de hierro solamente como se lo haya indicado el mdico. La sobredosis de hierro puede ser potencialmente mortal. Si le recetan suplementos de hierro, tmelos con jugo de naranja o un suplemento de vitaminaC.  Cocine los alimentos en ollas de hierro.  Consuma alimentos que contengan hierro no hemo junto con aquellos con alto contenido de hierro hemo. Esto ayuda a mejorar la absorcin de hierro.  Determinados alimentos y ciertas bebidas contienen compuestos que afectan la absorcin de hierro. No consuma estos alimentos en la misma comida que aquellos con alto contenido de hierro o con suplementos de Sport and exercise psychologist. Estos incluyen los siguientes:  Caf, t negro y vino tinto.  Leche, productos lcteos y alimentos con alto contenido de calcio.  Frijoles, porotos de soja y Kohler.  Cereales integrales.  Cuando consuma alimentos que contengan hierro no hemo y compuestos que afecten la absorcin de hierro, Office manager  consejos para mejorar la absorcin de hierro.  Antes de cocinarlos, remoje los frijoles durante la noche.  Antes de usarlos, remoje los cereales integrales durante la noche y culelos.  Prepare un fermento con las harinas antes del horneado, como si usara levadura en la masa del pan. QU  ALIMENTOS PUEDO COMER? Cereales Cereales para el desayuno fortificados con hierro. Pan de trigo integral fortificado con hierro. Arroz enriquecido. Granos germinados. Verduras [place]. Papas con cscara. Guisantes. Brcoli. Pimientos morrones rojos y verdes. Verduras fermentadas. Frutas Ciruelas pasas. Pasas. Naranjas. Hughie Closs. Mango. Pomelo. Carnes y otras fuentes de protenas Hgado de res. Ostras. Carne de vaca. Camarones. Pavo. Pollo. Atn. Sardinas. Garbanzos. Nueces. Tofu. Bebidas Jugo de tomate. Jugo de naranja recin exprimido. Jugo de ciruelas. T de hibisco. Batidos instantneos fortificados para el desayuno. Condimentos Tahini. Salsa de soja fermentada. Dulces y Contractor. Otros Germen de trigo. Los artculos mencionados arriba pueden no ser [employer] de las bebidas o los alimentos recomendados. Comunquese con el nutricionista para conocer ms opciones. QU ALIMENTOS NO SE RECOMIENDAN? Cereales Cereales integrales. Cereal de salvado. Harina de salvado. Avena. Verduras [place]. Repollitos de Bruselas. Col rizada. Lambert Mody Arndanos. Oletha Blend. Hughie Closs. Higos. Carnes y otras fuentes de protenas Soja. Productos elaborados a base de protena de la soja. Lcteos Leche. Crema. Queso. Yogur. [employer]. Bebidas Caf. T negro. Vino tinto. Dulces y [employer]. Chocolate. Helados. Otros [place]. Organo. Perejil. Los artculos mencionados arriba pueden no ser [employer] Company de las bebidas y los alimentos que se Higher education careers adviser. Comunquese con el nutricionista para obtener ms informacin. Esta informacin no tiene Marine scientist el consejo del mdico. Asegrese de hacerle al mdico cualquier pregunta que tenga. Document Released: 12/29/2005 Document Revised: 04/04/2014 Document Reviewed: 10/09/2013 Elsevier Interactive Patient Education  2017 [employer].

## 2016-06-16 NOTE — Progress Notes (Signed)
BP 128/80 (BP Location: Left Arm, Patient Position: Sitting, Cuff Size: Normal)   Pulse 69   Temp 97.7 F (36.5 C)   Ht 5' (1.524 m)   Wt 136 lb (61.7 kg)   SpO2 99%   BMI 26.56 kg/m    Subjective:    Patient ID: Joyce Roberts, female    DOB: 1970/06/02, 46 y.o.   MRN: [id number]  HPI: Joyce Roberts is a 46 y.o. female presenting on 06/16/2016 for Follow-up   HPI   [facility] referred her to [facility] Feeling tired and sleepy  Pt stopped her iron b/c it made her stomach hurt  Pt not taking protonix b/c she says it was expensive  Relevant past medical, surgical, family and social history reviewed and updated as indicated. Interim medical history since our last visit reviewed. Allergies and medications reviewed and updated.   Current Outpatient Prescriptions:  .  cetirizine (ZYRTEC) 10 MG tablet, Take 10 mg by mouth daily as needed for allergies., Disp: , Rfl:  .  Multiple Vitamin (MULTIVITAMIN) tablet, Take 1 tablet by mouth daily., Disp: , Rfl:  .  pantoprazole (PROTONIX) 40 MG tablet, Take 1 tablet (40 mg total) by mouth daily. 30 minutes before breakfast (Patient not taking: Reported on 06/16/2016), Disp: 90 tablet, Rfl: 3   Review of Systems  Constitutional: Positive for diaphoresis and fatigue. Negative for appetite change, chills, fever and unexpected weight change.  HENT: Positive for congestion, dental problem, sneezing and sore throat. Negative for drooling, ear pain, facial swelling, hearing loss, mouth sores, trouble swallowing and voice change.   Eyes: Negative for pain, discharge, redness, itching and visual disturbance.  Respiratory: Positive for cough. Negative for choking, shortness of breath and wheezing.   Cardiovascular: Negative for chest pain, palpitations and leg swelling.  Gastrointestinal: Positive for abdominal pain and constipation. Negative for blood in stool, diarrhea and vomiting.  Endocrine: Positive for polydipsia. Negative for cold  intolerance and heat intolerance.  Genitourinary: Negative for decreased urine volume, dysuria and hematuria.  Musculoskeletal: Positive for back pain. Negative for arthralgias and gait problem.  Skin: Negative for rash.  Allergic/Immunologic: Positive for environmental allergies.  Neurological: Positive for headaches. Negative for seizures, syncope and light-headedness.  Hematological: Negative for adenopathy.  Psychiatric/Behavioral: Negative for agitation, dysphoric mood and suicidal ideas. The patient is not nervous/anxious.     Per HPI unless specifically indicated above     Objective:    BP 128/80 (BP Location: Left Arm, Patient Position: Sitting, Cuff Size: Normal)   Pulse 69   Temp 97.7 F (36.5 C)   Ht 5' (1.524 m)   Wt 136 lb (61.7 kg)   SpO2 99%   BMI 26.56 kg/m   Wt Readings from Last 3 Encounters:  06/16/16 136 lb (61.7 kg)  06/15/16 135 lb 12.8 oz (61.6 kg)  04/27/16 137 lb (62.1 kg)    Physical Exam  Constitutional: She is oriented to person, place, and time. She appears well-developed and well-nourished.  HENT:  Head: Normocephalic and atraumatic.  Neck: Neck supple.  Cardiovascular: Normal rate and regular rhythm.   Pulmonary/Chest: Effort normal and breath sounds normal. No respiratory distress. She has no wheezes.  Abdominal: Soft. Bowel sounds are normal. She exhibits no distension and no mass. There is no hepatosplenomegaly. There is tenderness. There is no rigidity, no rebound and no guarding.  Musculoskeletal: She exhibits no edema.  Lymphadenopathy:    She has no cervical adenopathy.  Neurological: She is alert and oriented  to person, place, and time.  Skin: Skin is warm and dry.  Psychiatric: She has a normal mood and affect. Her behavior is normal.  Vitals reviewed.       Assessment & Plan:    Encounter Diagnoses  Name Primary?  Marland Kitchen Anemia, unspecified type Yes  . Constipation, unspecified constipation type   . Generalized abdominal pain    . Fatigue, unspecified type   . Screening for breast cancer     -order screening Mammogram -Pt to get financial stuff at [facility] done. Again gave pt the contact phone number -Pt given coupon for protonix -counseled pt on iron-rich diet and gave handout -f/u 3 months.  RTO sooner prn

## 2016-06-20 NOTE — Progress Notes (Signed)
Still with IDA. Ferritin is 8. What kind of iron was she taking before? If she has not taken fusion plus, we could try that.

## 2016-06-21 NOTE — Progress Notes (Signed)
Tried to call, not available. Leaving samples of the Fusion Plus #20 at front to pick up.

## 2016-06-21 NOTE — Progress Notes (Signed)
Pt's daughter, [name], is aware to come by and pick up the samples and to take one daily and let us know how pt does.

## 2016-06-21 NOTE — Progress Notes (Signed)
I called and spoke to pt's daughter, [name]. They are aware.  Her mom was taking iron [facility]) 65 mg daily. She said it caused her to have diarrhea and she stopped. I told her iron usually causes constipation. Please advise if you want her to take the Fusion Plus.

## 2016-06-21 NOTE — Progress Notes (Signed)
Let's try fusion plus. Can get samples. I don't know if she would be able to afford this or if there are patient assistance opportunities for this.

## 2016-06-29 ENCOUNTER — Encounter (HOSPITAL_COMMUNITY): Payer: Self-pay | Admitting: *Deleted

## 2016-06-29 ENCOUNTER — Emergency Department (HOSPITAL_COMMUNITY)
Admission: EM | Admit: 2016-06-29 | Discharge: 2016-06-29 | Disposition: A | Discharge status: Home / Self Care | Payer: Self-pay | Attending: Emergency Medicine | Admitting: Emergency Medicine

## 2016-06-29 DIAGNOSIS — Z791 Long term (current) use of non-steroidal anti-inflammatories (NSAID): Secondary | ICD-10-CM | POA: Insufficient documentation

## 2016-06-29 DIAGNOSIS — N390 Urinary tract infection, site not specified: Secondary | ICD-10-CM | POA: Insufficient documentation

## 2016-06-29 DIAGNOSIS — Z79899 Other long term (current) drug therapy: Secondary | ICD-10-CM | POA: Insufficient documentation

## 2016-06-29 DIAGNOSIS — N939 Abnormal uterine and vaginal bleeding, unspecified: Secondary | ICD-10-CM | POA: Insufficient documentation

## 2016-06-29 LAB — BASIC METABOLIC PANEL
ANION GAP: 8 (ref 5–15)
BUN: 13 mg/dL (ref 6–20)
CALCIUM: 9.3 mg/dL (ref 8.9–10.3)
CO2: 26 mmol/L (ref 22–32)
CREATININE: 0.74 mg/dL (ref 0.44–1.00)
Chloride: 105 mmol/L (ref 101–111)
GFR calc Af Amer: >60 mL/min (ref >60)
Glucose, Bld: 96 mg/dL (ref 65–99)
POTASSIUM: 4.2 mmol/L (ref 3.5–5.1)
Sodium: 139 mmol/L (ref 135–145)

## 2016-06-29 LAB — CBC WITH DIFFERENTIAL/PLATELET
BASOS ABS: 0 10*3/uL (ref 0.0–0.1)
Basophils Relative: 0 %
EOS PCT: 1 %
Eosinophils Absolute: 0.1 10*3/uL (ref 0.0–0.7)
HCT: 39.7 % (ref 36.0–46.0)
Hemoglobin: 13.2 g/dL (ref 12.0–15.0)
LYMPHS PCT: 20 %
Lymphs Abs: 2 10*3/uL (ref 0.7–4.0)
MCH: 27.7 pg (ref 26.0–34.0)
MCHC: 33.2 g/dL (ref 30.0–36.0)
MCV: 83.2 fL (ref 78.0–100.0)
Monocytes Absolute: 0.7 10*3/uL (ref 0.1–1.0)
Monocytes Relative: 7 %
NEUTROS ABS: 7.4 10*3/uL (ref 1.7–7.7)
Neutrophils Relative %: 72 %
PLATELETS: 218 10*3/uL (ref 150–400)
RBC: 4.77 MIL/uL (ref 3.87–5.11)
RDW: 14.3 % (ref 11.5–15.5)
WBC: 10.2 10*3/uL (ref 4.0–10.5)

## 2016-06-29 LAB — URINALYSIS, ROUTINE W REFLEX MICROSCOPIC
BACTERIA UA: NONE SEEN
Bilirubin Urine: NEGATIVE
Glucose, UA: NEGATIVE mg/dL
Ketones, ur: NEGATIVE mg/dL
Nitrite: NEGATIVE
PROTEIN: 30 mg/dL — AB
Specific Gravity, Urine: 1.015 (ref 1.005–1.030)
pH: 6 (ref 5.0–8.0)

## 2016-06-29 LAB — WET PREP, GENITAL
CLUE CELLS WET PREP: NONE SEEN
Sperm: NONE SEEN
TRICH WET PREP: NONE SEEN
YEAST WET PREP: NONE SEEN

## 2016-06-29 LAB — PREGNANCY, URINE: Preg Test, Ur: NEGATIVE

## 2016-06-29 LAB — HCG, QUANTITATIVE, PREGNANCY: hCG, Beta Chain, Quant, S: 1 m[IU]/mL (ref <5)

## 2016-06-29 MED ORDER — CEPHALEXIN 500 MG PO CAPS: 500.0000 mg | ORAL_CAPSULE | Freq: Four times a day (QID) | ORAL | 0 refills | Status: DC

## 2016-06-29 MED ORDER — CEPHALEXIN 500 MG PO CAPS
500.0000 mg | ORAL_CAPSULE | Freq: Once | ORAL | Status: AC
  Administered 2016-06-29: 500 mg via ORAL
  Filled 2016-06-29: qty 1

## 2016-06-29 MED ORDER — ACETAMINOPHEN 325 MG PO TABS
650.0000 mg | ORAL_TABLET | Freq: Once | ORAL | Status: AC
Start: 2016-06-29 — End: 2016-06-29
  Administered 2016-06-29: 650 mg via ORAL
  Filled 2016-06-29: qty 2

## 2016-06-29 MED ORDER — IBUPROFEN 400 MG PO TABS
400.0000 mg | ORAL_TABLET | Freq: Once | ORAL | Status: AC
  Administered 2016-06-29: 400 mg via ORAL
  Filled 2016-06-29: qty 1
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
## 2016-06-29 NOTE — ED Triage Notes (Signed)
Pt comes states that whenever she wipes herself she notes bright red blood. Pt states she started her period on march 18th and hasn't stopped bleeding since then. Pt has blood work the end of last month which showed a low hemoglobin (this was done by her GI office). NAD noted. Pt alert and oriented.

## 2016-06-29 NOTE — Discharge Instructions (Signed)
Take the prescription as directed.  Call your regular medical doctor or your OB/GYN doctor tomorrow to schedule a follow up appointment within the week.  Return to the Emergency Department immediately sooner if worsening.

## 2016-06-29 NOTE — ED Notes (Signed)
Instructed pt to take all of antibiotics as prescribed. 

## 2016-07-01 LAB — GC/CHLAMYDIA PROBE AMP (~~LOC~~) NOT AT ARMC
Chlamydia: NEGATIVE
Neisseria Gonorrhea: NEGATIVE

## 2016-07-04 ENCOUNTER — Ambulatory Visit: Payer: Self-pay | Admitting: Physician Assistant

## 2016-07-04 ENCOUNTER — Encounter: Payer: Self-pay | Admitting: Physician Assistant

## 2016-07-04 VITALS — BP 106/60 | HR 86 | Temp 98.1°F | Ht 60.0 in | Wt 136.8 lb

## 2016-07-04 DIAGNOSIS — R300 Dysuria: Secondary | ICD-10-CM

## 2016-07-04 DIAGNOSIS — N3090 Cystitis, unspecified without hematuria: Secondary | ICD-10-CM

## 2016-07-04 LAB — POC URINALSYSI DIPSTICK (AUTOMATED)
BILIRUBIN UA: NEGATIVE
Glucose, UA: NEGATIVE
KETONES UA: NEGATIVE
LEUKOCYTES UA: NEGATIVE
Nitrite, UA: NEGATIVE
PH UA: 5 (ref 5.0–8.0)
Protein, UA: NEGATIVE
SPEC GRAV UA: 1.015 (ref 1.030–1.035)
Urobilinogen, UA: 0.2 (ref ?–2.0)

## 2016-07-04 MED ORDER — CIPROFLOXACIN HCL 500 MG PO TABS: ORAL_TABLET | ORAL | 0 refills | Status: DC

## 2016-07-04 NOTE — Progress Notes (Signed)
BP 106/60 (BP Location: Left Arm, Patient Position: Sitting, Cuff Size: Normal)   Pulse 86   Temp 98.1 F (36.7 C)   Ht 5' (1.524 m)   Wt 136 lb 12 oz (62 kg)   LMP 06/12/2016   SpO2 99%   BMI 26.71 kg/m    Subjective:    Patient ID: Joyce Roberts, female    DOB: April 20, 1970, 46 y.o.   MRN: [id number]  HPI: Joyce Roberts is a 46 y.o. female presenting on 07/04/2016 for Back Pain (low back. pt states very bad on Saturday and Sunday where she could barely walk. pt currently taking antibiotic.); Dysuria (pt states having had painful urination and urine in blood. pt states now not so much pain, but more burning when urinatinf); and Follow-up (from ER visit. pt states she was told at hospital she had clamydia, but is unsure.)   HPI  Chief Complaint  Patient presents with  . Back Pain    low back. pt states very bad on Saturday and Sunday where she could barely walk. pt currently taking antibiotic.  Marland Kitchen Dysuria    pt states having had painful urination and urine in blood. pt states now not so much pain, but more burning when urinatinf  . Follow-up    from ER visit. pt states she was told at hospital she had clamydia, but is unsure.    Relevant past medical, surgical, family and social history reviewed and updated as indicated. Interim medical history since our last visit reviewed. Allergies and medications reviewed and updated.  CURRENT MEDS: Keflex Zyrtec IBU MVI protonix  Review of Systems  Constitutional: Positive for chills and fatigue. Negative for appetite change, diaphoresis, fever and unexpected weight change.  HENT: Negative for congestion, dental problem, drooling, ear pain, facial swelling, hearing loss, mouth sores, sneezing, sore throat, trouble swallowing and voice change.   Eyes: Negative for pain, discharge, redness, itching and visual disturbance.  Respiratory: Negative for cough, choking, shortness of breath and wheezing.   Cardiovascular: Negative for  chest pain, palpitations and leg swelling.  Gastrointestinal: Positive for abdominal pain and constipation. Negative for blood in stool, diarrhea and vomiting.  Endocrine: Negative for cold intolerance, heat intolerance and polydipsia.  Genitourinary: Positive for dysuria. Negative for decreased urine volume and hematuria.  Musculoskeletal: Positive for back pain. Negative for arthralgias and gait problem.  Skin: Negative for rash.  Allergic/Immunologic: Positive for environmental allergies.  Neurological: Negative for seizures, syncope, light-headedness and headaches.  Hematological: Negative for adenopathy.  Psychiatric/Behavioral: Negative for agitation, dysphoric mood and suicidal ideas. The patient is not nervous/anxious.     Per HPI unless specifically indicated above     Objective:    BP 106/60 (BP Location: Left Arm, Patient Position: Sitting, Cuff Size: Normal)   Pulse 86   Temp 98.1 F (36.7 C)   Ht 5' (1.524 m)   Wt 136 lb 12 oz (62 kg)   LMP 06/12/2016   SpO2 99%   BMI 26.71 kg/m   Wt Readings from Last 3 Encounters:  07/04/16 136 lb 12 oz (62 kg)  06/29/16 135 lb (61.2 kg)  06/16/16 136 lb (61.7 kg)    Physical Exam  Constitutional: She is oriented to person, place, and time. She appears well-developed and well-nourished.  HENT:  Head: Normocephalic and atraumatic.  Neck: Neck supple.  Cardiovascular: Normal rate and regular rhythm.   Pulmonary/Chest: Effort normal and breath sounds normal.  Abdominal: Soft. Bowel sounds are normal. She exhibits no  mass. There is no hepatosplenomegaly. There is no tenderness. There is CVA tenderness (on Right).  Musculoskeletal: She exhibits no edema.  Lymphadenopathy:    She has no cervical adenopathy.  Neurological: She is alert and oriented to person, place, and time.  Skin: Skin is warm and dry.  Psychiatric: She has a normal mood and affect. Her behavior is normal.  Vitals reviewed.   Results for orders placed or  performed during the hospital encounter of 06/29/16  Wet prep, genital  Result Value Ref Range   Yeast Wet Prep HPF POC NONE SEEN NONE SEEN   Trich, Wet Prep NONE SEEN NONE SEEN   Clue Cells Wet Prep HPF POC NONE SEEN NONE SEEN   WBC, Wet Prep HPF POC RARE (A) NONE SEEN   Sperm NONE SEEN   Urinalysis, Routine w reflex microscopic  Result Value Ref Range   Color, Urine YELLOW YELLOW   APPearance CLEAR CLEAR   Specific Gravity, Urine 1.015 1.005 - 1.030   pH 6.0 5.0 - 8.0   Glucose, UA NEGATIVE NEGATIVE mg/dL   Hgb urine dipstick LARGE (A) NEGATIVE   Bilirubin Urine NEGATIVE NEGATIVE   Ketones, ur NEGATIVE NEGATIVE mg/dL   Protein, ur 30 (A) NEGATIVE mg/dL   Nitrite NEGATIVE NEGATIVE   Leukocytes, UA SMALL (A) NEGATIVE   RBC / HPF TOO NUMEROUS TO COUNT 0 - 5 RBC/hpf   WBC, UA TOO NUMEROUS TO COUNT 0 - 5 WBC/hpf   Bacteria, UA NONE SEEN NONE SEEN   Squamous Epithelial / LPF 0-5 (A) NONE SEEN   Mucous PRESENT    Budding Yeast PRESENT   CBC with Differential  Result Value Ref Range   WBC 10.2 4.0 - 10.5 K/uL   RBC 4.77 3.87 - 5.11 MIL/uL   Hemoglobin 13.2 12.0 - 15.0 g/dL   HCT 39.7 36.0 - 46.0 %   MCV 83.2 78.0 - 100.0 fL   MCH 27.7 26.0 - 34.0 pg   MCHC 33.2 30.0 - 36.0 g/dL   RDW 14.3 11.5 - 15.5 %   Platelets 218 150 - 400 K/uL   Neutrophils Relative % 72 %   Neutro Abs 7.4 1.7 - 7.7 K/uL   Lymphocytes Relative 20 %   Lymphs Abs 2.0 0.7 - 4.0 K/uL   Monocytes Relative 7 %   Monocytes Absolute 0.7 0.1 - 1.0 K/uL   Eosinophils Relative 1 %   Eosinophils Absolute 0.1 0.0 - 0.7 K/uL   Basophils Relative 0 %   Basophils Absolute 0.0 0.0 - 0.1 K/uL  Basic metabolic panel  Result Value Ref Range   Sodium 139 135 - 145 mmol/L   Potassium 4.2 3.5 - 5.1 mmol/L   Chloride 105 101 - 111 mmol/L   CO2 26 22 - 32 mmol/L   Glucose, Bld 96 65 - 99 mg/dL   BUN 13 6 - 20 mg/dL   Creatinine, Ser 0.74 0.44 - 1.00 mg/dL   Calcium 9.3 8.9 - 10.3 mg/dL   GFR calc non Af Amer >60 >60  mL/min   GFR calc Af Amer >60 >60 mL/min   Anion gap 8 5 - 15  Pregnancy, urine  Result Value Ref Range   Preg Test, Ur NEGATIVE NEGATIVE  hCG, quantitative, pregnancy  Result Value Ref Range   hCG, Beta Chain, Quant, S 1 <5 mIU/mL  GC/Chlamydia probe amp ([place])not at [facility]  Result Value Ref Range   Chlamydia Negative    Neisseria gonorrhea Negative  Assessment & Plan:   Encounter Diagnoses  Name Primary?  . Cystitis Yes  . Dysuria      -reviewed lab results from hospital (ie that GC/chlamydia were negative) -rx cipro -urine sent for C&S -pt to follow up as scheduled.  RTO sooner worsening or new symptoms

## 2016-07-04 NOTE — Patient Instructions (Signed)
Infeccin de las vas [employer] (Urinary Tract Infection, Adult) Una infeccin urinaria (IU) es una infeccin en cualquier parte de las vas urinarias, que [employer] riones, los urteres, la vejiga y Geologist, engineering. Estos rganos fabrican, Buyer, retail y eliminan la orina del organismo. La IU puede ser una infeccin de la vejiga (cistitis) o infeccin de los riones (pielonefritis). CAUSAS Esta infeccin puede ser causada por hongos, virus o bacterias. Las bacterias son las causas ms comunes de las IU. Esta afeccin tambin puede ser provocada por no vaciar la vejiga por completo durante la miccin en repetidas ocasiones. FACTORES DE RIESGO Es ms probable que esta afeccin se manifieste si:  Usted ignora la necesidad de Garment/textile technologist o retiene la orina durante largos perodos.  No vaca la vejiga completamente durante la miccin.  Se limpia de atrs hacia adelante despus de orinar o defecar, en el caso de que sea [place].  Est circuncidado, en el caso de que sea varn.  Tiene estreimiento.  Tiene colocada una sonda urinaria permanente.  Tiene debilitado el sistema de defensa (inmunitario) del cuerpo.  Tiene una enfermedad que [employer] intestinos, los riones o la vejiga.  Tiene diabetes.  Toma antibiticos con frecuencia o durante largos perodos, y los antibiticos ya no resultan eficaces para combatir algunos tipos de infecciones (resistencia a los antibiticos).  Toma medicamentos que [employer] vas [place].  Est expuesto a sustancias qumicas que le irritan las vas urinarias.  Es mujer. SNTOMAS Los sntomas de esta afeccin incluyen lo siguiente:  Cristy Hilts.  Miccin frecuente o eliminacin de pequeas cantidades de orina con frecuencia.  Necesidad urgente de Garment/textile technologist.  Ardor o dolor al [employer].  Orina con mal olor u olor atpico.  Bennie Hind turbia.  Dolor en la parte baja del abdomen o en la espalda.  Dificultad para orinar.  Sangre en la  orina.  Vmitos o ms apetito de lo normal.  Diarrea o dolor abdominal.  Secrecin vaginal, si es mujer. DIAGNSTICO Esta afeccin se diagnostica mediante sus antecedentes mdicos y un examen fsico. Tambin deber proporcionar Truddie Coco de orina para realizar anlisis. Podrn indicarle otros estudios, por ejemplo:  Anlisis de [place].  Pruebas de deteccin de enfermedades de transmisin sexual (ETS). Si ha tenido ms de una IU, se pueden hacer estudios de diagnstico por imgenes o una citoscopia para determinar la causa de las infecciones. TRATAMIENTO El tratamiento de esta afeccin suele incluir una combinacin de dos o ms de los siguientes:  Antibiticos.  Otros medicamentos para tratar las causas menos frecuentes de infeccin urinaria.  Medicamentos de venta libre para Best boy.  Consumo de la cantidad necesaria de agua para mantenerse hidratado. [place] los medicamentos de venta libre y los recetados solamente como se lo haya indicado el mdico.  Si le recetaron un antibitico, tmelo como se lo haya indicado el mdico. No deje de tomar los antibiticos aunque comience a Sports administrator.  Evite el alcohol, la cafena, el t y las bebidas gaseosas. Estas sustancias pueden irritar la vejiga.  Beba suficiente lquido para Consulting civil engineer orina clara o de color amarillo plido.  Concurra a todas las visitas de control como se lo haya indicado el mdico. Esto es importante.  Asegrese de lo siguiente:  Vaciar la vejiga con frecuencia y en su totalidad. No contener la orina durante largos perodos.  Vaciar la vejiga antes y despus de Clinical biochemist.  Limpiar de adelante hacia atrs despus de defecar,  si es mujer. Usar cada trozo de papel una vez cuando se limpie. SOLICITE ATENCIN MDICA SI:  Siente dolor en la espalda.  Tiene fiebre.  Siente nuseas o vomita.  Los sntomas no mejoran despus de 3das de  tratamiento.  Los sntomas desaparecen y Teacher, adult education. SOLICITE ATENCIN MDICA DE INMEDIATO SI:  Siente dolor intenso en la espalda o en la zona inferior del abdomen.  Tiene vmitos y no puede tragar medicamentos ni agua. Esta informacin no tiene Marine scientist el consejo del mdico. Asegrese de hacerle al mdico cualquier pregunta que tenga. Document Released: 12/22/2004 Document Revised: 07/06/2015 Document Reviewed: 02/02/2015 Elsevier Interactive Patient Education  2017 [employer].

## 2016-07-05 ENCOUNTER — Other Ambulatory Visit: Payer: Self-pay | Admitting: Physician Assistant

## 2016-07-05 DIAGNOSIS — R300 Dysuria: Secondary | ICD-10-CM

## 2016-07-07 ENCOUNTER — Telehealth: Payer: Self-pay | Admitting: Student

## 2016-07-07 ENCOUNTER — Other Ambulatory Visit: Payer: Self-pay | Admitting: Physician Assistant

## 2016-07-07 LAB — URINE CULTURE: Organism ID, Bacteria: NO GROWTH

## 2016-07-07 MED ORDER — FLUCONAZOLE 150 MG PO TABS: ORAL_TABLET | ORAL | 0 refills | Status: DC

## 2016-07-07 NOTE — Telephone Encounter (Signed)
Pt called c/o yeast infection due to taking many antibiotics. Pt states she used 3 day monistat from 07/04/16 through 07/06/16. Pt states still having vaginal itching and burning after monistat.   PA sent rx for Diflucan to [facility]. Pt notified and verbalized understanding.

## 2016-07-11 ENCOUNTER — Ambulatory Visit (HOSPITAL_COMMUNITY)
Admission: RE | Admit: 2016-07-11 | Discharge: 2016-07-11 | Disposition: A | Discharge status: Home / Self Care | Payer: Self-pay | Source: Ambulatory Visit | Attending: Physician Assistant | Admitting: Physician Assistant

## 2016-07-13 NOTE — Progress Notes (Signed)
REVIEWED-NO ADDITIONAL RECOMMENDATIONS. 

## 2016-07-27 ENCOUNTER — Encounter: Payer: Self-pay | Admitting: Gastroenterology

## 2016-09-15 ENCOUNTER — Ambulatory Visit: Payer: Self-pay | Admitting: Physician Assistant

## 2016-09-15 ENCOUNTER — Encounter: Payer: Self-pay | Admitting: Physician Assistant

## 2016-09-15 ENCOUNTER — Other Ambulatory Visit (HOSPITAL_COMMUNITY)
Admission: RE | Admit: 2016-09-15 | Discharge: 2016-09-15 | Disposition: A | Discharge status: Home / Self Care | Payer: Self-pay | Source: Ambulatory Visit | Attending: Physician Assistant | Admitting: Physician Assistant

## 2016-09-15 VITALS — BP 110/74 | HR 74 | Temp 97.7°F | Ht 60.0 in | Wt 138.0 lb

## 2016-09-15 DIAGNOSIS — J019 Acute sinusitis, unspecified: Secondary | ICD-10-CM

## 2016-09-15 DIAGNOSIS — D649 Anemia, unspecified: Secondary | ICD-10-CM

## 2016-09-15 DIAGNOSIS — R1013 Epigastric pain: Secondary | ICD-10-CM

## 2016-09-15 LAB — HEMOGLOBIN AND HEMATOCRIT, BLOOD
HEMATOCRIT: 37.6 % (ref 36.0–46.0)
HEMOGLOBIN: 12.3 g/dL (ref 12.0–15.0)

## 2016-09-15 MED ORDER — AMOXICILLIN 500 MG PO CAPS: ORAL_CAPSULE | ORAL | 0 refills | Status: AC

## 2016-09-15 NOTE — Progress Notes (Signed)
BP 110/74 (BP Location: Left Arm, Patient Position: Sitting, Cuff Size: Normal)   Pulse 74   Temp 97.7 F (36.5 C)   Ht 5' (1.524 m)   Wt 138 lb (62.6 kg)   SpO2 99%   BMI 26.95 kg/m    Subjective:    Patient ID: Joyce Roberts, female    DOB: 1970/07/14, 46 y.o.   MRN: [id number]  HPI: Joyce Roberts is a 46 y.o. female presenting on 09/15/2016 for Follow-up and Nasal Congestion (Since 09-04-16. pt states sore throat, cough, chest congestion, phlegm. pt states she has taken tylenol decongestion and advil decongestion. pt statets no improvements.)   HPI   Chief Complaint  Patient presents with  . Follow-up  . Nasal Congestion    Since 09-04-16. pt states sore throat, cough, chest congestion, phlegm. pt states she has taken tylenol decongestion and advil decongestion. pt statets no improvements.     [facility] referred her to [facility].  Pt says she never got called for appt.  She is still having problems with her stomach.       Relevant past medical, surgical, family and social history reviewed and updated as indicated. Interim medical history since our last visit reviewed. Allergies and medications reviewed and updated.   Current Outpatient Prescriptions:  .  acetaminophen (TYLENOL) 500 MG tablet, Take 1,000 mg by mouth every 6 (six) hours as needed for moderate pain., Disp: , Rfl:  .  cetirizine (ZYRTEC) 10 MG tablet, Take 10 mg by mouth daily as needed for allergies., Disp: , Rfl:  .  ibuprofen (ADVIL,MOTRIN) 200 MG tablet, Take 400 mg by mouth every 6 (six) hours as needed for moderate pain., Disp: , Rfl:  .  Multiple Vitamin (MULTIVITAMIN) tablet, Take 1 tablet by mouth daily., Disp: , Rfl:  .  pantoprazole (PROTONIX) 40 MG tablet, Take 1 tablet (40 mg total) by mouth daily. 30 minutes before breakfast, Disp: 90 tablet, Rfl: 3   Review of Systems  Constitutional: Positive for chills and fatigue. Negative for appetite change, diaphoresis, fever and unexpected  weight change.  HENT: Positive for congestion, dental problem, sore throat, trouble swallowing and voice change. Negative for drooling, ear pain, facial swelling, hearing loss, mouth sores and sneezing.   Eyes: Negative for pain, discharge, redness, itching and visual disturbance.  Respiratory: Negative for cough, choking, shortness of breath and wheezing.   Cardiovascular: Negative for chest pain, palpitations and leg swelling.  Gastrointestinal: Positive for abdominal pain and constipation. Negative for blood in stool, diarrhea and vomiting.  Endocrine: Negative for cold intolerance, heat intolerance and polydipsia.  Genitourinary: Negative for decreased urine volume, dysuria and hematuria.  Musculoskeletal: Positive for back pain. Negative for arthralgias and gait problem.  Skin: Negative for rash.  Allergic/Immunologic: Positive for environmental allergies.  Neurological: Positive for headaches. Negative for seizures, syncope and light-headedness.  Hematological: Negative for adenopathy.  Psychiatric/Behavioral: Negative for agitation, dysphoric mood and suicidal ideas. The patient is not nervous/anxious.     Per HPI unless specifically indicated above     Objective:    BP 110/74 (BP Location: Left Arm, Patient Position: Sitting, Cuff Size: Normal)   Pulse 74   Temp 97.7 F (36.5 C)   Ht 5' (1.524 m)   Wt 138 lb (62.6 kg)   SpO2 99%   BMI 26.95 kg/m   Wt Readings from Last 3 Encounters:  09/15/16 138 lb (62.6 kg)  07/04/16 136 lb 12 oz (62 kg)  06/29/16 135 lb (61.2  kg)    Physical Exam  Constitutional: She is oriented to person, place, and time. She appears well-developed and well-nourished.  HENT:  Head: Normocephalic and atraumatic.  Right Ear: Hearing, tympanic membrane, external ear and ear canal normal.  Left Ear: Hearing, tympanic membrane, external ear and ear canal normal.  Nose: Mucosal edema and rhinorrhea present.  Mouth/Throat: Uvula is midline and  oropharynx is clear and moist. No oropharyngeal exudate.  Mucosa swelling on the Left  Neck: Neck supple.  Cardiovascular: Normal rate and regular rhythm.   Pulmonary/Chest: Effort normal and breath sounds normal. She has no wheezes.  Abdominal: Soft. Bowel sounds are normal. She exhibits no mass. There is no hepatosplenomegaly. There is no tenderness.  Musculoskeletal: She exhibits no edema.  Lymphadenopathy:    She has no cervical adenopathy.  Neurological: She is alert and oriented to person, place, and time.  Skin: Skin is warm and dry.  Psychiatric: She has a normal mood and affect. Her behavior is normal.  Vitals reviewed.       Assessment & Plan:   Encounter Diagnoses  Name Primary?  Marland Kitchen Anemia, unspecified type Yes  . Acute sinusitis, recurrence not specified, unspecified location   . Abdominal pain, epigastric      -Will have nurse check into referral -Check h/h -rx amoxil for sinusitis -follow up 4 months. RTO sooner prn

## 2016-12-26 ENCOUNTER — Ambulatory Visit: Payer: Self-pay | Admitting: Physician Assistant

## 2017-01-08 ENCOUNTER — Emergency Department (HOSPITAL_COMMUNITY)
Admission: EM | Admit: 2017-01-08 | Discharge: 2017-01-08 | Disposition: A | Discharge status: Home / Self Care | Payer: Self-pay | Attending: Emergency Medicine | Admitting: Emergency Medicine

## 2017-01-08 ENCOUNTER — Encounter (HOSPITAL_COMMUNITY): Payer: Self-pay | Admitting: Emergency Medicine

## 2017-01-08 DIAGNOSIS — K029 Dental caries, unspecified: Secondary | ICD-10-CM | POA: Insufficient documentation

## 2017-01-08 DIAGNOSIS — Z79899 Other long term (current) drug therapy: Secondary | ICD-10-CM | POA: Insufficient documentation

## 2017-01-08 MED ORDER — HYDROCODONE-ACETAMINOPHEN 5-325 MG PO TABS: 1.0000 | ORAL_TABLET | Freq: Four times a day (QID) | ORAL | 0 refills | Status: DC | PRN

## 2017-01-08 MED ORDER — HYDROCODONE-ACETAMINOPHEN 5-325 MG PO TABS
1.0000 | ORAL_TABLET | Freq: Once | ORAL | Status: AC
  Administered 2017-01-08: 1 via ORAL
  Filled 2017-01-08: qty 1

## 2017-01-08 MED ORDER — PENICILLIN V POTASSIUM 250 MG PO TABS
500.0000 mg | ORAL_TABLET | Freq: Once | ORAL | Status: AC
  Administered 2017-01-08: 500 mg via ORAL
  Filled 2017-01-08: qty 2

## 2017-01-08 MED ORDER — PENICILLIN V POTASSIUM 500 MG PO TABS: 500.0000 mg | ORAL_TABLET | Freq: Four times a day (QID) | ORAL | 0 refills | Status: AC
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
## 2017-01-08 NOTE — Discharge Instructions (Signed)
Follow up with a dentist next week.

## 2017-01-08 NOTE — ED Triage Notes (Addendum)
Patient c/o left upper dental pain that started 2 hours ago and now radiates into upper and lower jaw and left ear. Denies any fevers.

## 2017-01-16 ENCOUNTER — Encounter: Payer: Self-pay | Admitting: Physician Assistant

## 2017-01-16 ENCOUNTER — Ambulatory Visit: Payer: Self-pay | Admitting: Physician Assistant

## 2017-01-16 VITALS — BP 116/74 | HR 66 | Temp 98.1°F | Ht 60.0 in | Wt 137.0 lb

## 2017-01-16 DIAGNOSIS — R1013 Epigastric pain: Secondary | ICD-10-CM

## 2017-01-16 DIAGNOSIS — R109 Unspecified abdominal pain: Secondary | ICD-10-CM

## 2017-01-16 LAB — POCT URINALYSIS DIPSTICK
BILIRUBIN UA: NEGATIVE
GLUCOSE UA: NEGATIVE
KETONES UA: NEGATIVE
Leukocytes, UA: NEGATIVE
Nitrite, UA: NEGATIVE
Protein, UA: NEGATIVE
SPEC GRAV UA: 1.01 (ref 1.010–1.025)
Urobilinogen, UA: 0.2 E.U./dL
pH, UA: 6.5 (ref 5.0–8.0)

## 2017-01-16 MED ORDER — ESOMEPRAZOLE MAGNESIUM 40 MG PO CPDR: DELAYED_RELEASE_CAPSULE | ORAL | 3 refills | Status: DC

## 2017-01-16 NOTE — Progress Notes (Unsigned)
Called [phone] [facility] Financial Counseling on 01-16-17 and left voicemail with patient's name, DOB, and [place] contact number for them to call back

## 2017-01-16 NOTE — Progress Notes (Signed)
BP 116/74 (BP Location: Left Arm, Patient Position: Sitting, Cuff Size: Normal)   Pulse 66   Temp 98.1 F (36.7 C) (Other (Comment))   Ht 5' (1.524 m)   Wt 137 lb (62.1 kg)   LMP 01/01/2017   SpO2 99%   BMI 26.76 kg/m    Subjective:    Patient ID: Joyce Roberts, female    DOB: January 06, 1971, 46 y.o.   MRN: [id number]  HPI: Joyce Roberts is a 46 y.o. female presenting on 01/16/2017 for Anemia; Abdominal Pain; and Follow-up (went to [facility] 01-08-17 for toothache, using Ambesol and PCN with some relief)   HPI  Pt says she hasn't heard from [facility]. She says she turned in her financial aid application around aug 9.   She still has the abdoming pain.   First she says that eating makes it feel better, but then says that the pain gets worse.  Pain is still epigatric.   Pt stopped her pantoprazole. She says she stopped it because of the antibiotics.   Then she says she stopped it because it isn't helping her.   Pt states no dysuria but her "kidneys hurt"  Pt was originally referred to [facility] by local GI for second opinion of her abdominal pain.   Relevant past medical, surgical, family and social history reviewed and updated as indicated. Interim medical history since our last visit reviewed. Allergies and medications reviewed and updated.   Current Outpatient Prescriptions:  .  acetaminophen (TYLENOL) 500 MG tablet, Take 1,000 mg by mouth every 6 (six) hours as needed for moderate pain., Disp: , Rfl:  .  cetirizine (ZYRTEC) 10 MG tablet, Take 10 mg by mouth daily as needed for allergies., Disp: , Rfl:  .  HYDROcodone-acetaminophen (NORCO/VICODIN) 5-325 MG tablet, Take 1 tablet by mouth every 6 (six) hours as needed for moderate pain., Disp: 20 tablet, Rfl: 0 .  ibuprofen (ADVIL,MOTRIN) 200 MG tablet, Take 400 mg by mouth every 6 (six) hours as needed for moderate pain., Disp: , Rfl:  .  penicillin v potassium (VEETID) 500 MG tablet, Take 500 mg by mouth 4 (four) times daily., Disp: ,  Rfl:  .  Multiple Vitamin (MULTIVITAMIN) tablet, Take 1 tablet by mouth daily., Disp: , Rfl:  .  pantoprazole (PROTONIX) 40 MG tablet, Take 1 tablet (40 mg total) by mouth daily. 30 minutes before breakfast (Patient not taking: Reported on 01/16/2017), Disp: 90 tablet, Rfl: 3   Review of Systems  Constitutional: Negative for appetite change, chills, diaphoresis, fatigue, fever and unexpected weight change.  HENT: Positive for congestion, dental problem and sore throat. Negative for drooling, ear pain, facial swelling, hearing loss, mouth sores, sneezing, trouble swallowing and voice change.   Eyes: Negative for pain, discharge, redness, itching and visual disturbance.  Respiratory: Positive for cough. Negative for choking, shortness of breath and wheezing.   Cardiovascular: Negative for chest pain, palpitations and leg swelling.  Gastrointestinal: Positive for abdominal pain and diarrhea. Negative for blood in stool, constipation and vomiting.  Endocrine: Negative for cold intolerance, heat intolerance and polydipsia.  Genitourinary: Negative for decreased urine volume, dysuria and hematuria.  Musculoskeletal: Positive for back pain. Negative for arthralgias and gait problem.  Skin: Negative for rash.  Allergic/Immunologic: Positive for environmental allergies.  Neurological: Positive for headaches. Negative for seizures, syncope and light-headedness.  Hematological: Negative for adenopathy.  Psychiatric/Behavioral: Negative for agitation, dysphoric mood and suicidal ideas. The patient is not nervous/anxious.     Per HPI unless specifically  indicated above     Objective:    BP 116/74 (BP Location: Left Arm, Patient Position: Sitting, Cuff Size: Normal)   Pulse 66   Temp 98.1 F (36.7 C) (Other (Comment))   Ht 5' (1.524 m)   Wt 137 lb (62.1 kg)   LMP 01/01/2017   SpO2 99%   BMI 26.76 kg/m   Wt Readings from Last 3 Encounters:  01/16/17 137 lb (62.1 kg)  01/08/17 138 lb (62.6 kg)   09/15/16 138 lb (62.6 kg)    Physical Exam  Constitutional: She is oriented to person, place, and time. She appears well-developed and well-nourished.  HENT:  Head: Normocephalic and atraumatic.  Neck: Neck supple.  Cardiovascular: Normal rate and regular rhythm.   Pulmonary/Chest: Effort normal and breath sounds normal.  Abdominal: Soft. Bowel sounds are normal. She exhibits no distension and no mass. There is no hepatosplenomegaly. There is tenderness. There is no rebound and no guarding.  Musculoskeletal: She exhibits no edema.  Lymphadenopathy:    She has no cervical adenopathy.  Neurological: She is alert and oriented to person, place, and time.  Skin: Skin is warm and dry.  Psychiatric: She has a normal mood and affect. Her behavior is normal.  Vitals reviewed.   Results for orders placed or performed during the hospital encounter of 09/15/16  Hemoglobin and hematocrit, blood  Result Value Ref Range   Hemoglobin 12.3 12.0 - 15.0 g/dL   HCT 37.6 36.0 - 46.0 %      Assessment & Plan:    Encounter Diagnoses  Name Primary?  . Abdominal pain, epigastric Yes  . Flank pain     -rx nexium since pt does not think the protonix is helping her pain -nurse will attempt to find out what is delay with pt's referral to [facility] -pt to follow up in 1 month.  Will update PAP at that time.  She is to RTO sooner prn worsening or new symptoms

## 2017-02-20 ENCOUNTER — Ambulatory Visit: Payer: Self-pay | Admitting: Physician Assistant

## 2017-02-27 ENCOUNTER — Other Ambulatory Visit (HOSPITAL_COMMUNITY)
Admission: RE | Admit: 2017-02-27 | Discharge: 2017-02-27 | Disposition: A | Discharge status: Home / Self Care | Payer: Self-pay | Source: Ambulatory Visit | Attending: Physician Assistant | Admitting: Physician Assistant

## 2017-02-27 ENCOUNTER — Ambulatory Visit: Payer: Self-pay | Admitting: Physician Assistant

## 2017-02-27 ENCOUNTER — Encounter: Payer: Self-pay | Admitting: Physician Assistant

## 2017-02-27 VITALS — BP 108/76 | HR 103 | Temp 97.9°F | Ht 60.0 in | Wt 134.8 lb

## 2017-02-27 DIAGNOSIS — R1013 Epigastric pain: Secondary | ICD-10-CM

## 2017-02-27 DIAGNOSIS — D649 Anemia, unspecified: Secondary | ICD-10-CM

## 2017-02-27 DIAGNOSIS — Z124 Encounter for screening for malignant neoplasm of cervix: Secondary | ICD-10-CM

## 2017-02-27 DIAGNOSIS — N6320 Unspecified lump in the left breast, unspecified quadrant: Secondary | ICD-10-CM

## 2017-02-27 DIAGNOSIS — N921 Excessive and frequent menstruation with irregular cycle: Secondary | ICD-10-CM

## 2017-02-27 DIAGNOSIS — N644 Mastodynia: Secondary | ICD-10-CM

## 2017-02-27 LAB — HEMOGLOBIN AND HEMATOCRIT, BLOOD
HEMATOCRIT: 38.6 % (ref 36.0–46.0)
HEMOGLOBIN: 12.1 g/dL (ref 12.0–15.0)

## 2017-02-27 NOTE — Progress Notes (Signed)
BP 108/76 (BP Location: Left Arm, Patient Position: Sitting, Cuff Size: Normal)   Pulse (!) 103   Temp 97.9 F (36.6 C)   Ht 5' (1.524 m)   Wt 134 lb 12 oz (61.1 kg)   LMP 02/13/2017 (Exact Date)   SpO2 99%   BMI 26.32 kg/m    Subjective:    Patient ID: Joyce Roberts, female    DOB: June 28, 1970, 46 y.o.   MRN: [id number]  HPI: Joyce Roberts is a 46 y.o. female presenting on 02/27/2017 for Abdominal Pain and Gynecologic Exam   HPI   Joyce Roberts interpreting today)  Pt says she has not heard from [facility] still (in reference to referral for abdominal pain).   She was referred there by GI in 2017 but has still not been seen there.   LMP- 02/13/17   Relevant past medical, surgical, family and social history reviewed and updated as indicated. Interim medical history since our last visit reviewed. Allergies and medications reviewed and updated.   Current Outpatient Medications:  .  acetaminophen (TYLENOL) 500 MG tablet, Take 1,000 mg by mouth every 6 (six) hours as needed for moderate pain., Disp: , Rfl:  .  cetirizine (ZYRTEC) 10 MG tablet, Take 10 mg by mouth daily as needed for allergies., Disp: , Rfl:  .  esomeprazole (NEXIUM) 40 MG capsule, 1 po qd.  Tome una tableta por boca diaria, Disp: 30 capsule, Rfl: 3 .  ibuprofen (ADVIL,MOTRIN) 200 MG tablet, Take 400 mg by mouth every 6 (six) hours as needed for moderate pain., Disp: , Rfl:    Review of Systems  Constitutional: Positive for fatigue. Negative for appetite change, chills, diaphoresis, fever and unexpected weight change.  HENT: Positive for congestion and dental problem. Negative for drooling, ear pain, facial swelling, hearing loss, mouth sores, sneezing, sore throat, trouble swallowing and voice change.   Eyes: Negative for pain, discharge, redness, itching and visual disturbance.  Respiratory: Negative for cough, choking, shortness of breath and wheezing.   Cardiovascular: Positive for leg swelling. Negative  for chest pain and palpitations.  Gastrointestinal: Positive for constipation. Negative for abdominal pain, blood in stool, diarrhea and vomiting.  Endocrine: Negative for cold intolerance, heat intolerance and polydipsia.  Genitourinary: Negative for decreased urine volume, dysuria and hematuria.  Musculoskeletal: Negative for arthralgias, back pain and gait problem.  Skin: Negative for rash.  Allergic/Immunologic: Positive for environmental allergies.  Neurological: Positive for headaches. Negative for seizures, syncope and light-headedness.  Hematological: Negative for adenopathy.  Psychiatric/Behavioral: Negative for agitation, dysphoric mood and suicidal ideas. The patient is not nervous/anxious.     Per HPI unless specifically indicated above     Objective:    BP 108/76 (BP Location: Left Arm, Patient Position: Sitting, Cuff Size: Normal)   Pulse (!) 103   Temp 97.9 F (36.6 C)   Ht 5' (1.524 m)   Wt 134 lb 12 oz (61.1 kg)   LMP 02/13/2017 (Exact Date)   SpO2 99%   BMI 26.32 kg/m   Wt Readings from Last 3 Encounters:  02/27/17 134 lb 12 oz (61.1 kg)  01/16/17 137 lb (62.1 kg)  01/08/17 138 lb (62.6 kg)    Physical Exam  Constitutional: She is oriented to person, place, and time. She appears well-developed and well-nourished.  HENT:  Head: Normocephalic and atraumatic.  Neck: Neck supple.  Cardiovascular: Normal rate and regular rhythm.  Pulmonary/Chest: Effort normal and breath sounds normal.  Breast exam normal  Abdominal: Soft. Bowel sounds  are normal. She exhibits no mass. There is no hepatosplenomegaly. There is no tenderness. There is no rebound and no guarding.  Genitourinary: Vagina normal and uterus normal. There is breast tenderness. No breast swelling, discharge or bleeding. There is no rash, tenderness or lesion on the right labia. There is no rash, tenderness or lesion on the left labia. Cervix exhibits no motion tenderness, no discharge and no friability.  Right adnexum displays no mass, no tenderness and no fullness. Left adnexum displays no mass, no tenderness and no fullness.  Genitourinary Comments: (nurse Berenice assisted).  Tenderness L breast with ropey mass 11 o'clock position  Musculoskeletal: She exhibits no edema.  Lymphadenopathy:    She has no cervical adenopathy.  Neurological: She is alert and oriented to person, place, and time.  Skin: Skin is warm and dry.  Psychiatric: She has a normal mood and affect. Her behavior is normal.  Nursing note and vitals reviewed.       Assessment & Plan:    Encounter Diagnoses  Name Primary?  . Routine Papanicolaou smear Yes  . Abdominal pain, epigastric   . Anemia, unspecified type   . Breast pain, left   . Breast mass, left   . Menorrhagia with irregular cycle      -will recontact [facility] about referral- pt got financial approval but we were told that referral was pending approval ??? -pt told to call [place] for f/u.  They sent her a letter in May but it was in [place] and pt only reads spanish -check h/h -refer for diagnostic mammogram as pt feels like painful area is growing -Pt saw dr Glo Herring 07/27/15 for menorrhagia- she was put on OCP but pt cancelled her f/u OV-  Pt to call to reschedule appt -pt to follow up here 4 months. RTO sooner prn

## 2017-02-27 NOTE — Patient Instructions (Addendum)
Call [facility] for follow up appointment [phone]  [facility] [address] [place] [phone]

## 2017-03-02 ENCOUNTER — Encounter: Payer: Self-pay | Admitting: Physician Assistant

## 2017-03-02 NOTE — Progress Notes (Signed)
Spoke with [facility] financial counseling. Per [facility] staff application was received on 08-2016 but was still "pending review". Staff states no notes were made stating that there was missing information. Staff was unsure as to why the application was never processed. Per staff the application would no longer be valid due to it expires after 3 months if not approved; pt would have to submit new application.  Pt was given a new application to fill out and was also given [facility] financial counseling contact number in order for patient to call to get updates on the application process. Pt verbalized understanding.

## 2017-03-30 ENCOUNTER — Other Ambulatory Visit (HOSPITAL_COMMUNITY): Payer: Self-pay | Admitting: *Deleted

## 2017-03-30 DIAGNOSIS — N6459 Other signs and symptoms in breast: Secondary | ICD-10-CM

## 2017-04-11 ENCOUNTER — Encounter (HOSPITAL_COMMUNITY): Payer: Self-pay

## 2017-04-18 ENCOUNTER — Ambulatory Visit (HOSPITAL_COMMUNITY)
Admission: RE | Admit: 2017-04-18 | Discharge: 2017-04-18 | Disposition: A | Discharge status: Home / Self Care | Payer: PRIVATE HEALTH INSURANCE | Source: Ambulatory Visit | Attending: *Deleted | Admitting: *Deleted

## 2017-04-18 DIAGNOSIS — N6459 Other signs and symptoms in breast: Secondary | ICD-10-CM | POA: Diagnosis present

## 2017-04-18 DIAGNOSIS — N6452 Nipple discharge: Secondary | ICD-10-CM | POA: Diagnosis not present

## 2017-04-18 DIAGNOSIS — N6324 Unspecified lump in the left breast, lower inner quadrant: Secondary | ICD-10-CM | POA: Diagnosis not present

## 2017-04-19 ENCOUNTER — Other Ambulatory Visit (HOSPITAL_COMMUNITY): Payer: Self-pay | Admitting: *Deleted

## 2017-04-19 DIAGNOSIS — N6459 Other signs and symptoms in breast: Secondary | ICD-10-CM

## 2017-05-02 ENCOUNTER — Other Ambulatory Visit (HOSPITAL_COMMUNITY): Payer: Self-pay | Admitting: *Deleted

## 2017-05-02 ENCOUNTER — Ambulatory Visit (HOSPITAL_COMMUNITY)
Admission: RE | Admit: 2017-05-02 | Discharge: 2017-05-02 | Disposition: A | Discharge status: Home / Self Care | Payer: PRIVATE HEALTH INSURANCE | Source: Ambulatory Visit | Attending: *Deleted | Admitting: *Deleted

## 2017-05-02 DIAGNOSIS — R928 Other abnormal and inconclusive findings on diagnostic imaging of breast: Secondary | ICD-10-CM

## 2017-05-02 DIAGNOSIS — N6002 Solitary cyst of left breast: Secondary | ICD-10-CM | POA: Insufficient documentation

## 2017-05-02 DIAGNOSIS — N6459 Other signs and symptoms in breast: Secondary | ICD-10-CM

## 2017-05-02 MED ORDER — LIDOCAINE HCL (PF) 1 % IJ SOLN
INTRAMUSCULAR | Status: AC
  Administered 2017-05-02: 5 mL
  Filled 2017-05-02: qty 5

## 2017-05-02 MED ORDER — LIDOCAINE-EPINEPHRINE (PF) 1 %-1:200000 IJ SOLN
INTRAMUSCULAR | Status: AC
  Administered 2017-05-02: 5 mL
  Filled 2017-05-02: qty 30

## 2017-05-31 ENCOUNTER — Encounter: Payer: Self-pay | Admitting: Physician Assistant

## 2017-05-31 ENCOUNTER — Ambulatory Visit: Payer: Self-pay | Admitting: Physician Assistant

## 2017-05-31 VITALS — BP 96/60 | HR 88 | Temp 97.7°F | Ht 60.0 in | Wt 134.5 lb

## 2017-05-31 DIAGNOSIS — J019 Acute sinusitis, unspecified: Secondary | ICD-10-CM

## 2017-05-31 MED ORDER — AMOXICILLIN 500 MG PO CAPS
ORAL_CAPSULE | ORAL | 0 refills | Status: DC
Start: 2017-05-31 — End: 2017-06-28

## 2017-05-31 NOTE — Patient Instructions (Signed)
Sinusitis, en adultos Sinusitis, Adult La sinusitis es la inflamacin y Conservation officer, historic buildings en los senos paranasales. Los senos paranasales son espacios vacos en los huesos alrededor del rostro. Los senos paranasales se encuentran en estos lugares:  Alrededor de los ojos.  En la mitad de la frente.  Detrs de Mudlogger.  En los pmulos.  Los senos y las fosas nasales estn cubiertos de un lquido fibroso (mucosidad). Normalmente, la mucosidad drena a travs de los senos. Cuando los tejidos nasales se inflaman o hinchan, la mucosidad puede quedar atrapada o bloqueada, por lo que el aire no puede fluir por los senos paranasales. Esto fomenta la proliferacin de bacterias, virus y hongos, lo que produce infecciones. La sinusitis puede desarrollarse rpidamente y durar entre 7 y 10das (aguda) o ms de 12das (crnica). A menudo, esta afeccin surge despus de un resfriado. Cules son las causas? Esta afeccin es causada por cualquier sustancia que inflame los senos o evite que la mucosidad drene, por ejemplo:  Alergias.  Asma.  Infecciones virales o bacterianas.  Huesos con forma [employer] las fosas nasales.  Crecimientos nasales que contienen mucosidad (plipos nasales).  Aberturas sinusales estrechas.  Agentes contaminantes, como sustancias qumicas o irritantes presentes en el aire.  Un cuerpo extrao atorado [employer].  Infecciones por hongos. Esto es raro.  Qu incrementa el riesgo? Los siguientes factores pueden hacer que usted sea ms propenso a tener esta enfermedad:  Product manager o asma.  Haber tenido una infeccin reciente en las vas respiratorias superiores o un resfriado.  Tener deformidades estructurales u obstrucciones en la nariz o los senos.  Tener un sistema inmunitario dbil.  Nadar o bucear mucho.  Abusar de los [employer].  Fumar.  Cules son los signos o los sntomas? Los principales sntomas de esta afeccin son dolor y sensacin de  presin alrededor de los senos afectados. Otros sntomas pueden ser los siguientes:  Dolor en los dientes superiores.  Dolor de odos.  Dolor de [place].  Mal aliento.  Disminucin del sentido del olfato y del gusto.  Tos que empeora por la noche.  Fatiga.  Cristy Hilts.  Drenaje de mucosidad espesa que sale de la Lawyer. Generalmente, es de color verde y puede contener pus (purulento).  Nariz tapada o congestin nasal.  Goteo posnasal. Esto ocurre cuando se acumula mucosidad adicional en la garganta o la parte de atrs de la Lawyer.  Hinchazn y calor en los senos paranasales afectados.  Dolor de Investment banker, operational.  Sensibilidad a Naval architect.  Cmo se diagnostica? Esta enfermedad se diagnostica en funcin de los sntomas, los antecedentes mdicos y un examen fsico. Para descubrir si su afeccin es aguda o crnica, el mdico podra hacer lo siguiente:  Revisar su nariz en busca de plipos nasales.  Palpar los senos paranasales afectados para buscar signos de infeccin.  Observar la parte interna de los senos paranasales con un dispositivo que tiene una luz (endoscopio).  Si el mdico sospecha que usted padece sinusitis crnica, podra indicarle lo siguiente:  Pruebas de alergias.  Una muestra de mucosidad de la nariz (cultivo nasal) para buscar bacterias.  Examen de [place] de mucosidad, para ver si la sinusitis se relaciona con alguna alergia.  Si la sinusitis no responde al tratamiento y dura ms de 8semanas, se le podra pedir una resonancia magntica (RM) o una exploracin por tomografa computarizada (TC) para examinar los senos paranasales. Estos estudios tambin ayudan a Office manager gravedad de la infeccin. En contadas ocasiones, se puede ordenar  una biopsia de hueso para descartar tipos ms graves de infecciones por hongos en los senos paranasales. Cmo se trata? El tratamiento para la sinusitis depende de la causa y de si la afeccin es [place]. Si lo que  causa la sinusitis es un virus, los sntomas desparecern por s solos en el trmino de 10das. Podran recetarle medicamentos para [employer], entre los que se incluyen los siguientes:  Descongestivos nasales tpicos. Arthur y permiten que la mucosidad drene por los senos paranasales.  Antihistamnicos. Este tipo de medicamento bloquea la inflamacin que ocasionan las [place]ante. Pueden ayudar a reducir la inflamacin en la nariz y los senos.  Corticoesteroides nasales tpicos. Son aerosoles nasales que reducen la inflamacin e hinchazn en la Doran Durand y los senos.  Lavados nasales con solucin salina. Estos enjuagues pueden ayudar a eliminar la mucosidad espesa en la nariz.  Si la afeccin es causada por una bacteria, se le recetarn antibiticos. Si es causada por un hongo, se le recetarn antimicticos. Se podra necesitar ciruga para tratar enfermedades preexistentes, como las fosas nasales estrechas. Tambin podra ser necesaria para eliminar plipos. Siga estas indicaciones en su casa: [employer], use o aplquese los medicamentos de venta libre y Editor, commissioning como se lo haya indicado el mdico. Estos pueden incluir aerosoles nasales.  Si le recetaron un antibitico, tmelo como se lo haya indicado el mdico. No deje de tomar el antibitico aunque comience a sentirse mejor. Hidrtese y humidifique los ambientes  Beba suficiente agua para Theatre manager la orina clara o de color amarillo plido. Mantenerse hidratado lo ayudar a [employer].  Use un humidificador de vapor fro para mantener la humedad de su hogar por encima del 50%.  Realice inhalaciones de vapor por 10 a 71minutos, de 3 a 4veces al da o tal como se lo haya indicado el mdico. Puede hacer esto en el bao con el vapor del agua caliente de la ducha.  Limite la exposicin al aire fro o seco. Reposo  Descanse todo lo que pueda.  Duerma con la cabeza levantada  (elevada).  Asegrese de dormir lo suficiente cada noche. Instrucciones generales  Aplquese un pao tibio y hmedo en la cara 3 o 4veces al da o como se lo haya indicado el mdico. Esto ayuda a Actor las [place].  Lvese las manos frecuentemente con agua y jabn para reducir la exposicin a virus y otras bacterias. Use desinfectante para manos si no dispone de [place].  No fume. Evite estar cerca de personas que fuman (fumador pasivo).  Concurra a todas las visitas de seguimiento como se lo haya indicado el mdico. Esto es importante. Comunquese con un mdico si:  Tiene fiebre.  Los sntomas empeoran.  Los sntomas no mejoran en el trmino de 10das. Solicite ayuda de inmediato si:  Tiene un dolor de cabeza intenso.  Tiene vmitos persistentes.  Tiene dolor o hinchazn en la zona del rostro o los ojos.  Tiene problemas de visin.  Se siente confundido.  Tiene el cuello rgido.  Tiene dificultad para respirar. Esta informacin no tiene Marine scientist el consejo del mdico. Asegrese de hacerle al mdico cualquier pregunta que tenga. Document Released: 12/22/2004 Document Revised: 07/26/2016 Document Reviewed: 01/07/2015 Elsevier Interactive Patient Education  [employer].

## 2017-05-31 NOTE — Progress Notes (Signed)
BP 96/60 (BP Location: Left Arm, Patient Position: Sitting, Cuff Size: Normal)   Pulse 88   Temp 97.7 F (36.5 C) (Other (Comment))   Ht 5' (1.524 m)   Wt 134 lb 8 oz (61 kg)   LMP 05/12/2017 (Exact Date)   SpO2 99%   BMI 26.27 kg/m    Subjective:    Patient ID: Joyce Roberts, female    DOB: [birth year], 47 y.o.   MRN: [id number]  HPI: Joyce Roberts is a 47 y.o. female presenting on 05/31/2017 for Follow-up (sinus)   HPI   Pt statreed with cough and congestion 2 wk ago.  She been using OTCs but they don't help.  She has  HA, cough, congestion, ST.  No EA. No fever.   Relevant past medical, surgical, family and social history reviewed and updated as indicated. Interim medical history since our last visit reviewed. Allergies and medications reviewed and updated.   Current Outpatient Medications:  .  acetaminophen (TYLENOL) 500 MG tablet, Take 1,000 mg by mouth every 6 (six) hours as needed for moderate pain., Disp: , Rfl:  .  cetirizine (ZYRTEC) 10 MG tablet, Take 10 mg by mouth daily as needed for allergies., Disp: , Rfl:  .  esomeprazole (NEXIUM) 40 MG capsule, 1 po qd.  Tome una tableta por boca diaria, Disp: 30 capsule, Rfl: 3 .  ibuprofen (ADVIL,MOTRIN) 200 MG tablet, Take 400 mg by mouth every 6 (six) hours as needed for moderate pain., Disp: , Rfl:    Review of Systems  Constitutional: Positive for fatigue. Negative for appetite change, chills, diaphoresis, fever and unexpected weight change.  HENT: Positive for congestion, dental problem, mouth sores and sore throat. Negative for drooling, ear pain, facial swelling, hearing loss, sneezing, trouble swallowing and voice change.   Eyes: Negative for pain, discharge, redness, itching and visual disturbance.  Respiratory: Positive for cough. Negative for choking, shortness of breath and wheezing.   Cardiovascular: Positive for leg swelling. Negative for chest pain and palpitations.  Gastrointestinal: Positive for  abdominal pain and constipation. Negative for blood in stool, diarrhea and vomiting.  Endocrine: Negative for cold intolerance, heat intolerance and polydipsia.  Genitourinary: Negative for decreased urine volume, dysuria and hematuria.  Musculoskeletal: Positive for arthralgias and back pain. Negative for gait problem.  Skin: Negative for rash.  Allergic/Immunologic: Positive for environmental allergies.  Neurological: Positive for light-headedness and headaches. Negative for seizures and syncope.  Hematological: Negative for adenopathy.  Psychiatric/Behavioral: Negative for agitation, dysphoric mood and suicidal ideas. The patient is not nervous/anxious.     Per HPI unless specifically indicated above     Objective:    BP 96/60 (BP Location: Left Arm, Patient Position: Sitting, Cuff Size: Normal)   Pulse 88   Temp 97.7 F (36.5 C) (Other (Comment))   Ht 5' (1.524 m)   Wt 134 lb 8 oz (61 kg)   LMP 05/12/2017 (Exact Date)   SpO2 99%   BMI 26.27 kg/m   Wt Readings from Last 3 Encounters:  05/31/17 134 lb 8 oz (61 kg)  02/27/17 134 lb 12 oz (61.1 kg)  01/16/17 137 lb (62.1 kg)    Physical Exam  Constitutional: She is oriented to person, place, and time. She appears well-developed and well-nourished.  HENT:  Head: Normocephalic and atraumatic.  Right Ear: Hearing, tympanic membrane, external ear and ear canal normal.  Left Ear: Hearing, tympanic membrane, external ear and ear canal normal.  Nose: Mucosal edema and rhinorrhea present. Right  sinus exhibits maxillary sinus tenderness. Left sinus exhibits maxillary sinus tenderness.  Mouth/Throat: Uvula is midline and oropharynx is clear and moist. No oropharyngeal exudate.  Neck: Neck supple.  Cardiovascular: Normal rate and regular rhythm.  Pulmonary/Chest: Effort normal and breath sounds normal. She has no wheezes.  Abdominal: Soft. Bowel sounds are normal. She exhibits no mass. There is no hepatosplenomegaly. There is no  tenderness.  Musculoskeletal: She exhibits no edema.  Lymphadenopathy:    She has no cervical adenopathy.  Neurological: She is alert and oriented to person, place, and time.  Skin: Skin is warm and dry.  Psychiatric: She has a normal mood and affect. Her behavior is normal.  Vitals reviewed.       Assessment & Plan:   Encounter Diagnosis  Name Primary?  . Acute sinusitis, recurrence not specified, unspecified location Yes    -rx amoxil -OTCs prn -follow up as scheduled.  RTO sooner prn

## 2017-06-28 ENCOUNTER — Telehealth: Payer: Self-pay

## 2017-06-28 ENCOUNTER — Encounter: Payer: Self-pay | Admitting: Physician Assistant

## 2017-06-28 ENCOUNTER — Ambulatory Visit: Payer: Self-pay | Admitting: Physician Assistant

## 2017-06-28 ENCOUNTER — Other Ambulatory Visit (HOSPITAL_COMMUNITY)
Admission: RE | Admit: 2017-06-28 | Discharge: 2017-06-28 | Disposition: A | Discharge status: Home / Self Care | Payer: Self-pay | Source: Ambulatory Visit | Attending: Physician Assistant | Admitting: Physician Assistant

## 2017-06-28 VITALS — BP 116/71 | HR 80 | Temp 97.0°F | Ht 60.0 in | Wt 137.0 lb

## 2017-06-28 DIAGNOSIS — R1084 Generalized abdominal pain: Secondary | ICD-10-CM | POA: Insufficient documentation

## 2017-06-28 DIAGNOSIS — D649 Anemia, unspecified: Secondary | ICD-10-CM

## 2017-06-28 DIAGNOSIS — Z1239 Encounter for other screening for malignant neoplasm of breast: Secondary | ICD-10-CM

## 2017-06-28 DIAGNOSIS — R1013 Epigastric pain: Secondary | ICD-10-CM

## 2017-06-28 LAB — HEMOGLOBIN AND HEMATOCRIT, BLOOD
HCT: 35.4 % — ABNORMAL LOW (ref 36.0–46.0)
Hemoglobin: 11.1 g/dL — ABNORMAL LOW (ref 12.0–15.0)

## 2017-06-28 MED ORDER — OMEPRAZOLE 40 MG PO CPDR: DELAYED_RELEASE_CAPSULE | ORAL | 6 refills | Status: DC

## 2017-06-28 NOTE — Telephone Encounter (Signed)
Pt has appt here with Dr. Oneida Alar on 08/03/2017.

## 2017-06-28 NOTE — Telephone Encounter (Signed)
Fax received from the [facility] of the notice that pt did not qualify for financial assistance at [facility] diagnosis at this time is considered an elective medical service.   Scanning the paperwork to epic.

## 2017-06-28 NOTE — Progress Notes (Signed)
BP 116/71 (BP Location: Right Arm, Patient Position: Sitting, Cuff Size: Normal)   Pulse 80   Temp (!) 97 F (36.1 C) (Oral)   Ht 5' (1.524 m)   Wt 137 lb (62.1 kg)   LMP 06/12/2017 (Exact Date)   SpO2 100%   BMI 26.76 kg/m    Subjective:    Patient ID: Joyce Roberts, female    DOB: 07/29/1970, 47 y.o.   MRN: [id number]  HPI: Joyce Roberts is a 47 y.o. female presenting on 06/28/2017 for Follow-up   HPI   Pt was referred by GI to [facility] in 2017 for second opinion on dyspepsia and h pylori gastritis.  Pt received a letter dated March 2019 that she was declined for financial assistance at [facility].  Pt has Not seen by GI in over a year but she has appointment scheduled for May.  She says she is still haivng stomach problems.   She is not using any medication at all at this time.   Pt denies other problems  Relevant past medical, surgical, family and social history reviewed and updated as indicated. Interim medical history since our last visit reviewed. Allergies and medications reviewed and updated.   Current Outpatient Medications:  .  cetirizine (ZYRTEC) 10 MG tablet, Take 10 mg by mouth daily as needed for allergies., Disp: , Rfl:    Review of Systems  Constitutional: Positive for fatigue. Negative for appetite change, chills, diaphoresis, fever and unexpected weight change.  HENT: Positive for dental problem, sneezing and sore throat. Negative for congestion, drooling, ear pain, facial swelling, hearing loss, mouth sores, trouble swallowing and voice change.   Eyes: Positive for itching. Negative for pain, discharge, redness and visual disturbance.  Respiratory: Positive for cough. Negative for choking, shortness of breath and wheezing.   Cardiovascular: Negative for chest pain, palpitations and leg swelling.  Gastrointestinal: Positive for abdominal pain. Negative for blood in stool, constipation, diarrhea and vomiting.  Endocrine: Negative for cold intolerance,  heat intolerance and polydipsia.  Genitourinary: Negative for decreased urine volume, dysuria and hematuria.  Musculoskeletal: Negative for arthralgias, back pain and gait problem.  Skin: Negative for rash.  Allergic/Immunologic: Positive for environmental allergies.  Neurological: Positive for headaches. Negative for seizures, syncope and light-headedness.  Hematological: Negative for adenopathy.  Psychiatric/Behavioral: Negative for agitation, dysphoric mood and suicidal ideas. The patient is not nervous/anxious.     Per HPI unless specifically indicated above     Objective:    BP 116/71 (BP Location: Right Arm, Patient Position: Sitting, Cuff Size: Normal)   Pulse 80   Temp (!) 97 F (36.1 C) (Oral)   Ht 5' (1.524 m)   Wt 137 lb (62.1 kg)   LMP 06/12/2017 (Exact Date)   SpO2 100%   BMI 26.76 kg/m   Wt Readings from Last 3 Encounters:  06/28/17 137 lb (62.1 kg)  05/31/17 134 lb 8 oz (61 kg)  02/27/17 134 lb 12 oz (61.1 kg)    Physical Exam  Constitutional: She is oriented to person, place, and time. She appears well-developed and well-nourished.  HENT:  Head: Normocephalic and atraumatic.  Neck: Neck supple.  Cardiovascular: Normal rate and regular rhythm.  Pulmonary/Chest: Effort normal and breath sounds normal.  Abdominal: Soft. Bowel sounds are normal. She exhibits no distension, no ascites and no mass. There is no hepatosplenomegaly. There is generalized tenderness. There is no rigidity, no rebound and no guarding.  Musculoskeletal: She exhibits no edema.  Lymphadenopathy:    She  has no cervical adenopathy.  Neurological: She is alert and oriented to person, place, and time.  Skin: Skin is warm and dry.  Psychiatric: She has a normal mood and affect. Her behavior is normal.  Vitals reviewed.      Assessment & Plan:   Encounter Diagnoses  Name Primary?  . Generalized abdominal pain Yes  . Anemia, unspecified type   . Dyspepsia   . Screening for breast  cancer     -ordered screening Mammogram -Check h/h today.  Will call pt with results -rx omeprazole ($9/mo at [facility]) -Send letter to GI as an fyi- she has appointmentt there in may -Follow up 6 months.  RTO sooner prn

## 2017-06-28 NOTE — Telephone Encounter (Signed)
Please have patient follow-up for office visit here so we can discuss any further options. She failed 2 H.pylori treatment regimens. I don't see how an EGD with biopsies and obtaining culture and sensitivies is an elective medical service especially in light of the increased risk for gastric cancer with H.pylori. What was the diagnosis they are going by?

## 2017-06-29 ENCOUNTER — Other Ambulatory Visit: Payer: Self-pay | Admitting: Physician Assistant

## 2017-06-29 DIAGNOSIS — Z1231 Encounter for screening mammogram for malignant neoplasm of breast: Secondary | ICD-10-CM

## 2017-07-03 ENCOUNTER — Other Ambulatory Visit: Payer: Self-pay | Admitting: Physician Assistant

## 2017-07-03 MED ORDER — FUSION PLUS PO CAPS: ORAL_CAPSULE | ORAL | 6 refills | Status: DC

## 2017-08-03 ENCOUNTER — Ambulatory Visit (INDEPENDENT_AMBULATORY_CARE_PROVIDER_SITE_OTHER): Payer: Self-pay | Admitting: Gastroenterology

## 2017-08-03 ENCOUNTER — Other Ambulatory Visit: Payer: Self-pay | Admitting: *Deleted

## 2017-08-03 ENCOUNTER — Encounter: Payer: Self-pay | Admitting: *Deleted

## 2017-08-03 ENCOUNTER — Encounter: Payer: Self-pay | Admitting: Gastroenterology

## 2017-08-03 DIAGNOSIS — K581 Irritable bowel syndrome with constipation: Secondary | ICD-10-CM

## 2017-08-03 DIAGNOSIS — B9681 Helicobacter pylori [H. pylori] as the cause of diseases classified elsewhere: Secondary | ICD-10-CM

## 2017-08-03 DIAGNOSIS — R112 Nausea with vomiting, unspecified: Secondary | ICD-10-CM

## 2017-08-03 DIAGNOSIS — K2970 Gastritis, unspecified, without bleeding: Principal | ICD-10-CM

## 2017-08-03 DIAGNOSIS — G43A0 Cyclical vomiting, not intractable: Secondary | ICD-10-CM

## 2017-08-03 DIAGNOSIS — R1115 Cyclical vomiting syndrome unrelated to migraine: Secondary | ICD-10-CM

## 2017-08-03 NOTE — Assessment & Plan Note (Addendum)
SYMPTOMS NOT CONTROLLED OFF AMITIZA.  DRINK WATER TO KEEP YOUR URINE LIGHT YELLOW. Follow a gastroparesis diet. See handout. USE AMITIZA EVERY NIGHT FOR 7 DAYS THEN TWICE DAILY. IT TREATS CONSTIPATION BUT MAY CAUSE NAUSEA. SAMPLES GIVEN. FOLLOW UP IN 3 MOS.

## 2017-08-03 NOTE — Assessment & Plan Note (Signed)
SYMPTOMS NOT IDEALLY CONTROLLED AND MAY BE DUE TO GASTROPARESIS. GAINED 5 LBS IN PAST YEAR.   DRINK WATER TO KEEP YOUR URINE LIGHT YELLOW. Follow a gastroparesis diet. See handout. COMPLETE GASTRIC EMPTYING STUDY.  FOLLOW UP IN 3 MOS.

## 2017-08-03 NOTE — Assessment & Plan Note (Signed)
FAILED MULTIPLE REGIMENS.  SEE ID FOR RECOMMENDATIONS. FOLLOW UP IN 3 MOS.

## 2017-08-03 NOTE — Patient Instructions (Addendum)
[place].  Seguir una dieta gastroparesia. Vea el folleto.  UTILICE AMITIZA TODAS LAS NOCHES POR 7 DAS LUEGO DOS VECES DIARIAMENTE. TRATA LA CONSTIPACIN PERO PUEDE CAUSAR NAUSEA.  ESTUDIO DE EMPLEO GASTRICO COMPLETO.   VER ENFERMEDAD INFECCIOSA PARA RECOMENDACIONES SOBRE EL TRATAMIENTO H PYLORI.  SEGUIR EN 3 MOS.            DRINK WATER TO KEEP YOUR URINE LIGHT YELLOW.  Follow a gastroparesis diet. See handout.  USE AMITIZA EVERY NIGHT FOR 7 DAYS THEN TWICE DAILY. IT TREATS CONSTIPATION BUT MAY CAUSE NAUSEA.  COMPLETE GASTRIC EMPTYING STUDY.   SEE INFECTIOUS DISEASE FOR RECOMMENDATIONS REGARDING THE H PYLORI TREATMENT.  FOLLOW UP IN 3 MOS.
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
## 2017-08-03 NOTE — Progress Notes (Signed)
PATIENT SCHEDULED  °

## 2017-08-03 NOTE — Progress Notes (Signed)
CC'ED TO PCP 

## 2017-08-10 ENCOUNTER — Encounter (HOSPITAL_COMMUNITY)
Admission: RE | Admit: 2017-08-10 | Discharge: 2017-08-10 | Disposition: A | Discharge status: Home / Self Care | Payer: Self-pay | Source: Ambulatory Visit | Attending: Gastroenterology | Admitting: Gastroenterology

## 2017-08-10 DIAGNOSIS — R112 Nausea with vomiting, unspecified: Secondary | ICD-10-CM | POA: Insufficient documentation

## 2017-08-10 MED ORDER — TECHNETIUM TC 99M SULFUR COLLOID
2.0000 | Freq: Once | INTRAVENOUS | Status: AC | PRN
  Administered 2017-08-10: 2 via ORAL

## 2017-08-14 NOTE — Progress Notes (Signed)
PT's daughter, [name], is aware. Pt has appt with Infectious Disease 08/15/2017.

## 2017-08-15 ENCOUNTER — Encounter: Payer: Self-pay | Admitting: Family

## 2017-08-15 ENCOUNTER — Ambulatory Visit (INDEPENDENT_AMBULATORY_CARE_PROVIDER_SITE_OTHER): Payer: Self-pay | Admitting: Family

## 2017-08-15 VITALS — BP 130/92 | HR 70 | Temp 97.7°F | Ht 60.0 in | Wt 139.8 lb

## 2017-08-15 DIAGNOSIS — K2970 Gastritis, unspecified, without bleeding: Secondary | ICD-10-CM

## 2017-08-15 DIAGNOSIS — B9681 Helicobacter pylori [H. pylori] as the cause of diseases classified elsewhere: Secondary | ICD-10-CM

## 2017-08-15 MED ORDER — SUCRALFATE 1 G PO TABS: 1.0000 g | ORAL_TABLET | Freq: Three times a day (TID) | ORAL | 0 refills | Status: DC

## 2017-08-15 NOTE — Patient Instructions (Signed)
Nice to meet you.  We will get the H. Pylori breath test - please do not eat or drink 1 hour prior to the test.   If positive we will consider an endoscopy for possible culture or re-trying a previously attempted regimen.   Please try the carafate to help with the gastritis. If too expensive do not buy.   Continue to take the Amitza as prescribed.
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
## 2017-08-16 ENCOUNTER — Encounter: Payer: Self-pay | Admitting: Family

## 2017-08-16 LAB — H. PYLORI BREATH TEST: H. pylori Breath Test: DETECTED — AB

## 2017-08-16 NOTE — Assessment & Plan Note (Signed)
[name] has been previously diagnosed with H. Pylori which has been recurrent following completion of 3 differing regimens. Unfortunately she has an allergy to tetracyclines which prohibits usage of the Quadruple therapy. She has had EGDs performed in the past with no culture results able to be resulted. Her symptoms are concerning for Irritable Bowel syndrome with constipation given the cramping followed by relief after bowel movements. This does not necessarily explain her gastritis. I will check the H. Pylori breath test today to confirm she continues to have H. Pylori. If positive may require additional EGD to obtain cultures to determine sensitivities for appropriate antibiotic recommendations. We could potentially retry a regimen, however it is unlikely to be successful. I have also added carafate for her to try to help with the gastritis and reflux. Plan to follow up pending lab wok results.

## 2017-08-17 ENCOUNTER — Telehealth: Payer: Self-pay

## 2017-08-17 ENCOUNTER — Telehealth: Payer: Self-pay | Admitting: Gastroenterology

## 2017-08-17 DIAGNOSIS — B9681 Helicobacter pylori [H. pylori] as the cause of diseases classified elsewhere: Secondary | ICD-10-CM

## 2017-08-17 DIAGNOSIS — K2970 Gastritis, unspecified, without bleeding: Principal | ICD-10-CM

## 2017-08-17 MED ORDER — METRONIDAZOLE 500 MG PO TABS: 500.0000 mg | ORAL_TABLET | Freq: Two times a day (BID) | ORAL | 0 refills | Status: DC

## 2017-08-17 MED ORDER — AMOXICILLIN 500 MG PO TABS: 1000.0000 mg | ORAL_TABLET | Freq: Two times a day (BID) | ORAL | 0 refills | Status: DC

## 2017-08-17 MED ORDER — CLARITHROMYCIN 500 MG PO TABS: 500.0000 mg | ORAL_TABLET | Freq: Two times a day (BID) | ORAL | 0 refills | Status: DC

## 2017-08-17 NOTE — Telephone Encounter (Signed)
-----   Message from Golden Circle, [place] sent at 08/17/2017 12:19 PM EDT ----- Please inform patient that her breath test was positive for H. Pylori and I am working with her stomach doctor on the course of action.

## 2017-08-17 NOTE — Telephone Encounter (Signed)
CALLED PATHOLOGY. THEY DO NOT PERFORM H PYLORI CX OR ABX RESISTANCE TESTING. RECOMMENDED CONTACTING [facility].  CALLING [facility] TODAY TO INQUIRE.

## 2017-08-17 NOTE — Telephone Encounter (Signed)
Please inform patient that our next course of action is to try a different medication regimen. Please instruct her to continue to take the omeprazole. I am sending 3 different medications (Clarithromycin, Amoxicillin, and Metronidazole)  to her pharmacy ([facility] in [place]). Please have her continue her omeprazole. These medications should not be expensive. Would recommend GoodRx or [email or web address] for coupons as needed. No alcohol while taking the metronidazole as that may result in vomiting. Plan to have her follow up 1 month after the completion of therapy or sooner if needed.

## 2017-08-17 NOTE — Telephone Encounter (Signed)
I called and spoke with Joyce Roberts at [facility] customer service, they can do hpylori culture but they do not do the antibiotic resistance testing.   The test code for the culture is [phone]

## 2017-08-17 NOTE — Addendum Note (Signed)
Addended by: Mauricio Po D on: 08/17/2017 02:26 PM   Modules accepted: Orders

## 2017-08-17 NOTE — Telephone Encounter (Signed)
Per Marya Amsler call pt to inform her of her lab results had interpreter call pt's mobile and home phone with no luck in reaching the pt. Interpreter stated that both numbers given gave an error message. Will attempt to call pt at a later time. [place]

## 2017-08-17 NOTE — Telephone Encounter (Signed)
-----   Message from Golden Circle, [place] sent at 08/16/2017  2:01 PM EDT ----- Dr. Oneida Alar,  I saw a mutual patient Joyce Roberts yesterday for recommendations regarding re-current/resistant H. Pylori gastritis. I retested her breath test and as expected it returned positive. Based on the fact that she has failed 3 separate regimens and is unable to complete the Quadruple Therapy secondary to allergy, it may be in our best interest to attempt to get another culture to determine resistance. The only other regimen (which would repeat 2 antibiotics) would be the ACG Concomitant Regimen consisting of PPI, Amoxicillin (1g), Clarithromycin (500 mg), and Metronidazole (500 mg) for 14 days. I wanted to touch base with you before proceeding to get your thoughts on the possibility of another EGD for culture.  Thank you for the referral and I look forward to hearing your thoughts.  Marya Amsler

## 2017-08-17 NOTE — Telephone Encounter (Signed)
H pylori culture WITH ABX resistance testing not available. PROCEED WITH ACG Concomitant Regimen PER ID.

## 2017-08-18 NOTE — Telephone Encounter (Signed)
Thank you Dr. Oneida Alar!

## 2017-08-18 NOTE — Telephone Encounter (Signed)
REVIEWED. AGREE. NO ADDITIONAL RECOMMENDATIONS. 

## 2017-10-29 ENCOUNTER — Emergency Department (HOSPITAL_COMMUNITY)
Admission: EM | Admit: 2017-10-29 | Discharge: 2017-10-29 | Disposition: A | Discharge status: Home / Self Care | Payer: No Typology Code available for payment source | Attending: Emergency Medicine | Admitting: Emergency Medicine

## 2017-10-29 ENCOUNTER — Encounter (HOSPITAL_COMMUNITY): Payer: Self-pay | Admitting: Emergency Medicine

## 2017-10-29 ENCOUNTER — Other Ambulatory Visit: Payer: Self-pay

## 2017-10-29 ENCOUNTER — Emergency Department (HOSPITAL_COMMUNITY): Payer: No Typology Code available for payment source

## 2017-10-29 DIAGNOSIS — M542 Cervicalgia: Secondary | ICD-10-CM

## 2017-10-29 DIAGNOSIS — Y92410 Unspecified street and highway as the place of occurrence of the external cause: Secondary | ICD-10-CM | POA: Insufficient documentation

## 2017-10-29 DIAGNOSIS — M25511 Pain in right shoulder: Secondary | ICD-10-CM | POA: Insufficient documentation

## 2017-10-29 DIAGNOSIS — M545 Low back pain, unspecified: Secondary | ICD-10-CM

## 2017-10-29 DIAGNOSIS — Z79899 Other long term (current) drug therapy: Secondary | ICD-10-CM | POA: Insufficient documentation

## 2017-10-29 DIAGNOSIS — Y998 Other external cause status: Secondary | ICD-10-CM | POA: Insufficient documentation

## 2017-10-29 DIAGNOSIS — Y9389 Activity, other specified: Secondary | ICD-10-CM | POA: Insufficient documentation

## 2017-10-29 MED ORDER — CYCLOBENZAPRINE HCL 10 MG PO TABS
5.0000 mg | ORAL_TABLET | Freq: Once | ORAL | Status: AC
  Administered 2017-10-29: 5 mg via ORAL
  Filled 2017-10-29: qty 1

## 2017-10-29 MED ORDER — IBUPROFEN 800 MG PO TABS
800.0000 mg | ORAL_TABLET | Freq: Once | ORAL | Status: AC
  Administered 2017-10-29: 800 mg via ORAL
  Filled 2017-10-29: qty 1

## 2017-10-29 MED ORDER — IBUPROFEN 800 MG PO TABS: 800.0000 mg | ORAL_TABLET | Freq: Three times a day (TID) | ORAL | 0 refills | Status: AC

## 2017-10-29 MED ORDER — CYCLOBENZAPRINE HCL 5 MG PO TABS: 5.0000 mg | ORAL_TABLET | Freq: Two times a day (BID) | ORAL | 0 refills | Status: AC | PRN
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
## 2017-10-29 NOTE — Discharge Instructions (Signed)
X-ray of your shoulder was very good. Nothing is dislocated or broken.  Don't be surprised if you are more sore tomorrow. I have prescribed you muscle relaxer (Flexeril) and ibuprofen to help with pain in your neck and back. You may also use warm or cold compresses for additional relief.   If you continue to have pain after 4-6 weeks, follow-up with your PCP.
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
## 2017-11-21 ENCOUNTER — Telehealth: Payer: Self-pay | Admitting: *Deleted

## 2017-11-21 NOTE — Telephone Encounter (Signed)
Patient called the Spanish line to find out her lab results, plan of action from May 2019. Johaura Celadonio assisted in translating the call. Patient never filled her prescriptions from [facility], did not know she medications sent there.  Per chart review, RN relayed lab results (positive for H pylori), treatment plan (omeprazole plus 2 weeks of biaxin, amoxicillin, metronidazole - NO ALCOHOL), and follow up appointment.  Johaura confirmed contact information. Patient will call back to schedule follow up. RN called [facility] in [place], confirmed regimen and asked them to fill it with instructions/labels in Spanish.  This will be ready for pick up today. Patient is scheduled to see [facility] 8/28. Landis Gandy, RN

## 2017-11-22 ENCOUNTER — Ambulatory Visit (INDEPENDENT_AMBULATORY_CARE_PROVIDER_SITE_OTHER): Payer: Self-pay | Admitting: Gastroenterology

## 2017-11-22 ENCOUNTER — Encounter: Payer: Self-pay | Admitting: Gastroenterology

## 2017-11-22 DIAGNOSIS — B9681 Helicobacter pylori [H. pylori] as the cause of diseases classified elsewhere: Secondary | ICD-10-CM

## 2017-11-22 DIAGNOSIS — R1115 Cyclical vomiting syndrome unrelated to migraine: Secondary | ICD-10-CM

## 2017-11-22 DIAGNOSIS — G43A0 Cyclical vomiting, not intractable: Secondary | ICD-10-CM

## 2017-11-22 DIAGNOSIS — D649 Anemia, unspecified: Secondary | ICD-10-CM

## 2017-11-22 DIAGNOSIS — K2970 Gastritis, unspecified, without bleeding: Secondary | ICD-10-CM

## 2017-11-22 DIAGNOSIS — K581 Irritable bowel syndrome with constipation: Secondary | ICD-10-CM

## 2017-11-22 MED ORDER — OMEPRAZOLE 20 MG PO CPDR: DELAYED_RELEASE_CAPSULE | ORAL | 11 refills | Status: DC

## 2017-11-22 MED ORDER — FLUCONAZOLE 150 MG PO TABS: 150.0000 mg | ORAL_TABLET | Freq: Once | ORAL | 0 refills | Status: AC

## 2017-11-22 MED ORDER — OMEPRAZOLE 40 MG PO CPDR: DELAYED_RELEASE_CAPSULE | ORAL | 6 refills | Status: DC

## 2017-11-22 NOTE — Telephone Encounter (Signed)
PT SEEN IN [facility] TODAY WITH AN INTERPRETER. EXPLAINED WHY SHE NEEDS TO TAKE THE ABX WITH OMEPRAZOLE BID. PT VOICED HER UNDERSTANDING. STRESSED THAT THIS IS HER LAST BEST SHOT TO CLEAR  THE H PYLORI BACTERIA.

## 2017-11-22 NOTE — Assessment & Plan Note (Signed)
LIKELY DUE TO UNTREATED H PYLORI.  CONTINUE TO MONITOR SYMPTOMS.
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
## 2017-11-22 NOTE — Patient Instructions (Addendum)
[place] LIGERAMENTE AMARILLA.   SIGUE UNA DIETA BAJA FODMAP. VER FOLLETO.  PARA REDUCIR EL CONSTIPATION, UTILICE LA LECHE DE PILLS DE MAGNESIA O LIQUIDO AL MENOS UNA VEZ UN DIA Y HASTA TRES TIEMPOS AL DIA. SOSTENGA PARA [place]. SI TIENES DIARRHEA SOLO TOMA MOM CADA MON, WED, & FRI.  ANTIBIOTICOS COMPLETOS Y TOMAR OMEPRAZOLE DOS VECES UN DIA.  SIGUIENTE EN 4 MOS.        DRINK WATER TO KEEP YOUR URINE LIGHT YELLOW.   FOLLOW A LOW FODMAP DIET. SEE HANDOUT.  TO REDUCE CONSTIPATION, USE MILK OF MAGNESIA PILLS OR LIQUID AT LEAST ONCE A DAY AND UP TO THREETIMES A DAY. HOLD FOR DIARRHEA. IF YOU HAVE DIARRHEA ONLY TAKE MOM EVERY MON, WED, & FRI.  COMPLETE ANTIBIOTICS AND TAKE OMEPRAZOLE TWICE A DAY.  FOLLOW UP IN 4 MOS.

## 2017-11-22 NOTE — Progress Notes (Signed)
ON RECALL  °

## 2017-11-22 NOTE — Assessment & Plan Note (Addendum)
CLINICALLY IMPROVED.  CBC AT [facility]

## 2017-11-22 NOTE — Assessment & Plan Note (Signed)
SYMPTOMS NOT IDEALLY CONTROLLED.  ADD MOM. HOLD FOR DIARRHEA AND TIERRA DOSE. FOLLOW UP IN 4 MOS.

## 2017-11-22 NOTE — Assessment & Plan Note (Signed)
SYMPTOMS NOT CONTROLLED.  HAS ABX AND WANTS TO KNOW WHAT TYPE. ADD DIFLUCAN x1 TO PREVENT YEATS INFECTION RX FOR OMEPRAZOLE WRITTEN AND PRINTED PER PT REQUEST.Marland Kitchen  TAKE 30 MINUTES PRIOR TO YOUR MEALS TWICE DAILY. ASK PHARMACY ABOUT IRON. COMPLETE ABX FROM ID CLINIC. FOLLOW UP IN 4 MOS.

## 2017-11-22 NOTE — Progress Notes (Signed)
CC'ED TO PCP 

## 2017-11-22 NOTE — Telephone Encounter (Signed)
Please have her make a follow up appointment for 4 weeks after completing antibiotics. She will need to prepare for her breath test with the following instructions:   - Fast for at least 3-6 hours prior to the test.  - Stop smoking for at least 30 minutes before the test.  - Stop taking antibiotics for at least 4 weeks prior to the test, especially Amoxicillin (Amoxil, Moxam), Bismuth tricitrate (Denol), Clarithromycin (Klacid), Fasigyn (Trinidazole), Metronidazole (Flagyl), and Tetracycline (Tetrex, Mysteclin, Achromycin).  - Stop taking proton pump inhibitors in the form of Losec (Omeprazole), Somac (Pantoprazole Sodium Sesquihydrate), Zoton (Lansoprazole), and Nexiam for at least 1 week before the test.  Thanks!

## 2017-11-23 ENCOUNTER — Other Ambulatory Visit (HOSPITAL_COMMUNITY)
Admission: RE | Admit: 2017-11-23 | Discharge: 2017-11-23 | Disposition: A | Discharge status: Home / Self Care | Payer: Self-pay | Source: Ambulatory Visit | Attending: Gastroenterology | Admitting: Gastroenterology

## 2017-11-23 DIAGNOSIS — D649 Anemia, unspecified: Secondary | ICD-10-CM | POA: Insufficient documentation

## 2017-11-23 LAB — CBC WITH DIFFERENTIAL/PLATELET
BASOS PCT: 0 %
Basophils Absolute: 0 10*3/uL (ref 0.0–0.1)
EOS ABS: 0.1 10*3/uL (ref 0.0–0.7)
EOS PCT: 2 %
HCT: 37.6 % (ref 36.0–46.0)
Hemoglobin: 12.1 g/dL (ref 12.0–15.0)
Lymphocytes Relative: 27 %
Lymphs Abs: 1.7 10*3/uL (ref 0.7–4.0)
MCH: 26.8 pg (ref 26.0–34.0)
MCHC: 32.2 g/dL (ref 30.0–36.0)
MCV: 83.2 fL (ref 78.0–100.0)
MONO ABS: 0.5 10*3/uL (ref 0.1–1.0)
MONOS PCT: 7 %
Neutro Abs: 4.1 10*3/uL (ref 1.7–7.7)
Neutrophils Relative %: 64 %
PLATELETS: 224 10*3/uL (ref 150–400)
RBC: 4.52 MIL/uL (ref 3.87–5.11)
RDW: 14.5 % (ref 11.5–15.5)
WBC: 6.5 10*3/uL (ref 4.0–10.5)

## 2017-11-23 NOTE — Progress Notes (Signed)
Called, VM not set up. Mailing a letter of results.

## 2017-11-28 ENCOUNTER — Other Ambulatory Visit (HOSPITAL_COMMUNITY): Payer: Self-pay | Admitting: *Deleted

## 2017-11-28 DIAGNOSIS — Z1231 Encounter for screening mammogram for malignant neoplasm of breast: Secondary | ICD-10-CM

## 2017-11-29 ENCOUNTER — Other Ambulatory Visit: Payer: Self-pay

## 2017-11-29 DIAGNOSIS — K2970 Gastritis, unspecified, without bleeding: Principal | ICD-10-CM

## 2017-11-29 DIAGNOSIS — B9681 Helicobacter pylori [H. pylori] as the cause of diseases classified elsewhere: Secondary | ICD-10-CM

## 2017-11-29 MED ORDER — CLARITHROMYCIN 500 MG PO TABS: 500.0000 mg | ORAL_TABLET | Freq: Two times a day (BID) | ORAL | 0 refills | Status: DC

## 2017-12-04 ENCOUNTER — Ambulatory Visit (HOSPITAL_COMMUNITY)
Admission: RE | Admit: 2017-12-04 | Discharge: 2017-12-04 | Disposition: A | Discharge status: Home / Self Care | Payer: PRIVATE HEALTH INSURANCE | Source: Ambulatory Visit | Attending: *Deleted | Admitting: *Deleted

## 2017-12-04 DIAGNOSIS — Z1231 Encounter for screening mammogram for malignant neoplasm of breast: Secondary | ICD-10-CM

## 2017-12-06 ENCOUNTER — Encounter: Payer: Self-pay | Admitting: Physician Assistant

## 2017-12-06 ENCOUNTER — Ambulatory Visit: Payer: Self-pay | Admitting: Physician Assistant

## 2017-12-06 VITALS — BP 142/86 | HR 92 | Temp 97.4°F

## 2017-12-06 DIAGNOSIS — R300 Dysuria: Secondary | ICD-10-CM

## 2017-12-06 LAB — POCT URINALYSIS DIPSTICK
Bilirubin, UA: NEGATIVE
Glucose, UA: NEGATIVE
Ketones, UA: NEGATIVE
NITRITE UA: NEGATIVE
PROTEIN UA: NEGATIVE
Spec Grav, UA: <=1.005 — AB (ref 1.010–1.025)
Urobilinogen, UA: 0.2 E.U./dL
pH, UA: 5 (ref 5.0–8.0)

## 2017-12-06 NOTE — Progress Notes (Signed)
   BP (!) 142/86   Pulse 92   Temp (!) 97.4 F (36.3 C)   LMP 12/03/2017   SpO2 99%    Subjective:    Patient ID: Joyce Roberts, female    DOB: 08-Jan-1971, 47 y.o.   MRN: [id number]  HPI: Joyce Roberts is a 47 y.o. female presenting on 12/06/2017 for Dysuria   HPI  Pt's daughter is will her today and is helping with translating.  Pt says her urine smelled stronger since last month and Friday she started with burning.  She took a diflucan Sunday morning and it didn't help.  She was given 3 antibiotics for stomach infection but she didn't take them yet because they're expensive.   Relevant past medical, surgical, family and social history reviewed and updated as indicated. Interim medical history since our last visit reviewed. Allergies and medications reviewed and updated.   Current Outpatient Medications:  .  cetirizine (ZYRTEC) 10 MG tablet, Take 10 mg by mouth daily as needed for allergies., Disp: , Rfl:  .  omeprazole (PRILOSEC) 20 MG capsule, 1 PO BID. 1 POR BOCA DOS VECES AL DA, Disp: 60 capsule, Rfl: 11   Review of Systems  Constitutional: Negative for fever.  Respiratory: Negative for shortness of breath.   Cardiovascular: Negative for chest pain.  Gastrointestinal: Negative for abdominal pain, nausea and vomiting.  Genitourinary: Positive for dysuria.    Per HPI unless specifically indicated above     Objective:    BP (!) 142/86   Pulse 92   Temp (!) 97.4 F (36.3 C)   LMP 12/03/2017   SpO2 99%   Wt Readings from Last 3 Encounters:  11/22/17 138 lb 12.8 oz (63 kg)  08/15/17 139 lb 12.8 oz (63.4 kg)  08/03/17 140 lb (63.5 kg)    Physical Exam  Constitutional: She is oriented to person, place, and time. She appears well-developed and well-nourished.  HENT:  Head: Normocephalic and atraumatic.  Neck: Neck supple.  Cardiovascular: Normal rate and regular rhythm.  Pulmonary/Chest: Effort normal and breath sounds normal.  Abdominal: Soft. Bowel  sounds are normal. She exhibits no mass. There is no hepatosplenomegaly. There is generalized tenderness (mild, diffuse). There is no rigidity, no rebound, no guarding and no CVA tenderness.  Musculoskeletal: She exhibits no edema.  Lymphadenopathy:    She has no cervical adenopathy.  Neurological: She is alert and oriented to person, place, and time.  Skin: Skin is warm and dry.  Psychiatric: She has a normal mood and affect. Her behavior is normal.  Vitals reviewed.       Assessment & Plan:    Encounter Diagnosis  Name Primary?  . Dysuria Yes    -UA reviewed with pt -counseled pt to get her antibiotics given for her h pylori.  She says she was given coupons to help with cost -counseled pt to increase water and avoid sodas -pt to follow up as scheduled.  RTO sooner prn worsening or new symptoms

## 2017-12-13 NOTE — Telephone Encounter (Signed)
RN reviewing chart to follow up on patient's phone call 8/27.  As of 9/11, patient has not yet started h pylori treatment due to cost.

## 2017-12-19 NOTE — Telephone Encounter (Signed)
Noted  

## 2017-12-28 ENCOUNTER — Ambulatory Visit: Payer: Self-pay | Admitting: Physician Assistant

## 2018-01-08 ENCOUNTER — Ambulatory Visit: Payer: Self-pay | Admitting: Physician Assistant

## 2018-01-08 ENCOUNTER — Other Ambulatory Visit (HOSPITAL_COMMUNITY)
Admission: RE | Admit: 2018-01-08 | Discharge: 2018-01-08 | Disposition: A | Discharge status: Home / Self Care | Payer: Self-pay | Source: Ambulatory Visit | Attending: Physician Assistant | Admitting: Physician Assistant

## 2018-01-08 ENCOUNTER — Encounter: Payer: Self-pay | Admitting: Physician Assistant

## 2018-01-08 VITALS — BP 117/75 | HR 71 | Temp 97.5°F

## 2018-01-08 DIAGNOSIS — G8929 Other chronic pain: Secondary | ICD-10-CM

## 2018-01-08 DIAGNOSIS — R109 Unspecified abdominal pain: Principal | ICD-10-CM

## 2018-01-08 DIAGNOSIS — Z13220 Encounter for screening for lipoid disorders: Secondary | ICD-10-CM | POA: Insufficient documentation

## 2018-01-08 LAB — COMPREHENSIVE METABOLIC PANEL
ALT: 12 U/L (ref 0–44)
ANION GAP: 9 (ref 5–15)
AST: 15 U/L (ref 15–41)
Albumin: 4.2 g/dL (ref 3.5–5.0)
Alkaline Phosphatase: 67 U/L (ref 38–126)
BUN: 11 mg/dL (ref 6–20)
CO2: 25 mmol/L (ref 22–32)
Calcium: 8.6 mg/dL — ABNORMAL LOW (ref 8.9–10.3)
Chloride: 104 mmol/L (ref 98–111)
Creatinine, Ser: 0.71 mg/dL (ref 0.44–1.00)
GFR calc Af Amer: >60 mL/min (ref >60)
Glucose, Bld: 94 mg/dL (ref 70–99)
POTASSIUM: 4 mmol/L (ref 3.5–5.1)
Sodium: 138 mmol/L (ref 135–145)
TOTAL PROTEIN: 7.4 g/dL (ref 6.5–8.1)
Total Bilirubin: 0.3 mg/dL (ref 0.3–1.2)

## 2018-01-08 LAB — LIPID PANEL
CHOL/HDL RATIO: 4.4 ratio
CHOLESTEROL: 191 mg/dL (ref 0–200)
HDL: 43 mg/dL (ref >40)
LDL Cholesterol: 112 mg/dL — ABNORMAL HIGH (ref 0–99)
Triglycerides: 182 mg/dL — ABNORMAL HIGH (ref <150)
VLDL: 36 mg/dL (ref 0–40)

## 2018-01-08 NOTE — Progress Notes (Signed)
BP 117/75   Pulse 71   Temp (!) 97.5 F (36.4 C)   SpO2 100%    Subjective:    Patient ID: Joyce Roberts, female    DOB: [birth year], 47 y.o.   MRN: [id number]  HPI: Joyce Roberts is a 47 y.o. female presenting on 01/08/2018 for Follow-up   HPI  -pt has Chronic abdominal pain- she was seen by GI in August.  She has been seen by ID for h pylori.  She was given low FODMAP diet and is on omeprazole.   She is on recall to follow up with GI in December.   Pt says she has no issues other than her abdominal pain  Relevant past medical, surgical, family and social history reviewed and updated as indicated. Interim medical history since our last visit reviewed. Allergies and medications reviewed and updated.   Current Outpatient Medications:  .  cetirizine (ZYRTEC) 10 MG tablet, Take 10 mg by mouth daily as needed for allergies., Disp: , Rfl:  .  omeprazole (PRILOSEC) 20 MG capsule, 1 PO BID. 1 POR BOCA DOS VECES AL DA, Disp: 60 capsule, Rfl: 11   Review of Systems  Constitutional: Positive for fatigue. Negative for appetite change, chills, diaphoresis, fever and unexpected weight change.  HENT: Positive for dental problem and sneezing. Negative for congestion, drooling, ear pain, facial swelling, hearing loss, mouth sores, sore throat, trouble swallowing and voice change.   Eyes: Negative for pain, discharge, redness, itching and visual disturbance.  Respiratory: Positive for cough. Negative for choking, shortness of breath and wheezing.   Cardiovascular: Negative for chest pain, palpitations and leg swelling.  Gastrointestinal: Positive for abdominal pain and constipation. Negative for blood in stool, diarrhea and vomiting.  Endocrine: Negative for cold intolerance, heat intolerance and polydipsia.  Genitourinary: Negative for decreased urine volume, dysuria and hematuria.  Musculoskeletal: Negative for arthralgias, back pain and gait problem.  Skin: Negative for rash.   Allergic/Immunologic: Positive for environmental allergies.  Neurological: Negative for seizures, syncope, light-headedness and headaches.  Hematological: Negative for adenopathy.  Psychiatric/Behavioral: Negative for agitation, dysphoric mood and suicidal ideas. The patient is not nervous/anxious.     Per HPI unless specifically indicated above     Objective:    BP 117/75   Pulse 71   Temp (!) 97.5 F (36.4 C)   SpO2 100%   Wt Readings from Last 3 Encounters:  11/22/17 138 lb 12.8 oz (63 kg)  08/15/17 139 lb 12.8 oz (63.4 kg)  08/03/17 140 lb (63.5 kg)    Physical Exam  Constitutional: She is oriented to person, place, and time. She appears well-developed and well-nourished.  HENT:  Head: Normocephalic and atraumatic.  Neck: Neck supple.  Cardiovascular: Normal rate and regular rhythm.  Pulmonary/Chest: Effort normal and breath sounds normal.  Abdominal: Soft. Bowel sounds are normal. She exhibits no mass. There is no hepatosplenomegaly. There is generalized tenderness. There is no rigidity, no rebound and no guarding.  Musculoskeletal: She exhibits no edema.  Lymphadenopathy:    She has no cervical adenopathy.  Neurological: She is alert and oriented to person, place, and time.  Skin: Skin is warm and dry.  Psychiatric: She has a normal mood and affect. Her behavior is normal.  Vitals reviewed.       Assessment & Plan:    Encounter Diagnoses  Name Primary?  . Chronic abdominal pain Yes  . Screening cholesterol level     -Check lipids.  Will call results -pt encouraged  to renew [facility]- she is given an application -pt to follow up 6 months.  RTO sooner prn

## 2018-01-17 ENCOUNTER — Encounter: Payer: Self-pay | Admitting: Gastroenterology

## 2018-04-23 ENCOUNTER — Encounter: Payer: Self-pay | Admitting: Physician Assistant

## 2018-04-23 ENCOUNTER — Ambulatory Visit: Payer: Self-pay | Admitting: Physician Assistant

## 2018-04-23 VITALS — BP 122/84 | HR 87 | Temp 97.7°F | Ht 60.0 in | Wt 139.0 lb

## 2018-04-23 DIAGNOSIS — J019 Acute sinusitis, unspecified: Secondary | ICD-10-CM

## 2018-04-23 MED ORDER — AMOXICILLIN 500 MG PO CAPS: ORAL_CAPSULE | ORAL | 0 refills | Status: DC

## 2018-04-23 NOTE — Progress Notes (Signed)
BP 122/84 (BP Location: Left Arm, Patient Position: Sitting, Cuff Size: Normal)   Pulse 87   Temp 97.7 F (36.5 C) (Other (Comment))   Ht 5' (1.524 m)   Wt 139 lb (63 kg)   SpO2 99%   BMI 27.15 kg/m    Subjective:    Patient ID: Joyce Roberts, female    DOB: 12-Oct-1970, 47 y.o.   MRN: [id number]  HPI: Joyce Roberts is a 48 y.o. female presenting on 04/23/2018 for Sinus Problem (c/o headache and facial pain around nose and eyes, not alleviated by otcs for a week, also feels dizzy and nauseated with headaches)   HPI   Pt started feeling bad 4 days ago.  She says her face hurts above her eys and on cheecks.  Little cough.  No EA.  Some ST.  Some stuffy nose.    She tried used advil sinus and allergy which helped a little bit.    Relevant past medical, surgical, family and social history reviewed and updated as indicated. Interim medical history since our last visit reviewed. Allergies and medications reviewed and updated.   Current Outpatient Medications:  .  acetaminophen (TYLENOL) 500 MG tablet, Take 1,000 mg by mouth 2 (two) times daily as needed., Disp: , Rfl:  .  ibuprofen (ADVIL,MOTRIN) 100 MG/5ML suspension, Take 400 mg by mouth 2 (two) times daily as needed., Disp: , Rfl:  .  omeprazole (PRILOSEC) 20 MG capsule, 1 PO BID. 1 POR BOCA DOS VECES AL DA, Disp: 60 capsule, Rfl: 11 .  cetirizine (ZYRTEC) 10 MG tablet, Take 10 mg by mouth daily as needed for allergies., Disp: , Rfl:     Review of Systems  Constitutional: Positive for chills, diaphoresis and fatigue. Negative for appetite change, fever and unexpected weight change.  HENT: Positive for congestion, sore throat and trouble swallowing. Negative for dental problem, drooling, ear pain, facial swelling, hearing loss, mouth sores, sneezing and voice change.   Eyes: Negative for pain, discharge, redness, itching and visual disturbance.  Respiratory: Positive for cough. Negative for choking, shortness of breath and  wheezing.   Cardiovascular: Negative for chest pain, palpitations and leg swelling.  Gastrointestinal: Positive for constipation. Negative for abdominal pain, blood in stool, diarrhea and vomiting.  Endocrine: Negative for cold intolerance, heat intolerance and polydipsia.  Genitourinary: Negative for decreased urine volume, dysuria and hematuria.  Musculoskeletal: Negative for arthralgias, back pain and gait problem.  Skin: Negative for rash.  Allergic/Immunologic: Positive for environmental allergies.  Neurological: Positive for headaches. Negative for seizures, syncope and light-headedness.  Hematological: Negative for adenopathy.  Psychiatric/Behavioral: Negative for agitation, dysphoric mood and suicidal ideas. The patient is not nervous/anxious.     Per HPI unless specifically indicated above     Objective:    BP 122/84 (BP Location: Left Arm, Patient Position: Sitting, Cuff Size: Normal)   Pulse 87   Temp 97.7 F (36.5 C) (Other (Comment))   Ht 5' (1.524 m)   Wt 139 lb (63 kg)   SpO2 99%   BMI 27.15 kg/m   Wt Readings from Last 3 Encounters:  04/23/18 139 lb (63 kg)  11/22/17 138 lb 12.8 oz (63 kg)  08/15/17 139 lb 12.8 oz (63.4 kg)    Physical Exam Vitals signs reviewed.  Constitutional:      Appearance: She is well-developed.  HENT:     Head: Normocephalic and atraumatic.     Right Ear: Hearing, tympanic membrane, ear canal and external ear normal.  Left Ear: Hearing, tympanic membrane, ear canal and external ear normal.     Nose: Mucosal edema, congestion and rhinorrhea present.     Right Turbinates: Enlarged and swollen.     Left Turbinates: Enlarged and swollen.     Right Sinus: Maxillary sinus tenderness present.     Left Sinus: Maxillary sinus tenderness present.     Mouth/Throat:     Pharynx: Uvula midline. No oropharyngeal exudate.  Neck:     Musculoskeletal: Neck supple.  Cardiovascular:     Rate and Rhythm: Normal rate and regular rhythm.   Pulmonary:     Effort: Pulmonary effort is normal.     Breath sounds: Normal breath sounds. No wheezing.  Lymphadenopathy:     Cervical: No cervical adenopathy.  Skin:    General: Skin is warm and dry.  Neurological:     Mental Status: She is alert and oriented to person, place, and time.  Psychiatric:        Behavior: Behavior normal.         Assessment & Plan:     Encounter Diagnosis  Name Primary?  . Acute sinusitis, recurrence not specified, unspecified location Yes     -rx amoxil -pt to use OTCs prn -pt to follow up as scheduled.  RTO sooner prn

## 2018-06-12 IMAGING — US ULTRASOUND LEFT BREAST LIMITED
1 series · 12 of 14 positions shown · non-contrast
Comparison: Previous exam(s).

CLINICAL DATA: 46-year-old female presenting for evaluation of a
palpable lump in the left breast. She states that this has been
present for several years, but has become larger, heavier and more
tender. She says she has history of bilateral spontaneous milky
nipple discharge, but has more clear discharge during compression
for mammography. This is also from both breasts. She did have 1
instance of pain can't nipple discharge during a mammogram, but does
not remember what year that was. She has chronic left nipple
inversion.

EXAM:
2D DIGITAL DIAGNOSTIC UNILATERAL LEFT MAMMOGRAM WITH CAD AND ADJUNCT
TOMO
LEFT BREAST ULTRASOUND

[Series 1: ultrasound left breast limited · 0.07mm/px · 12 of 14 slices shown]
[im 1/14]
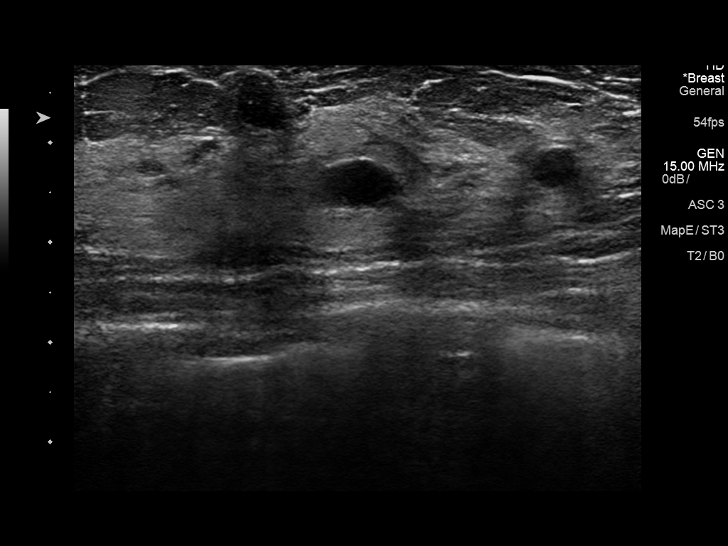
[im 2/14]
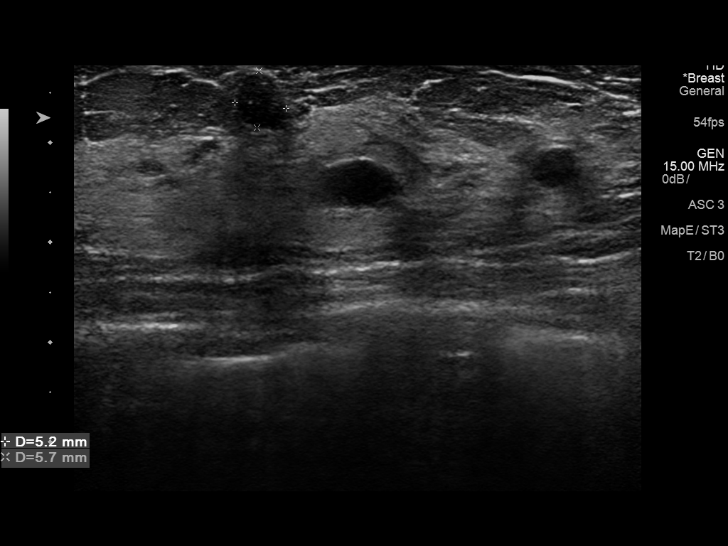
[im 3/14]
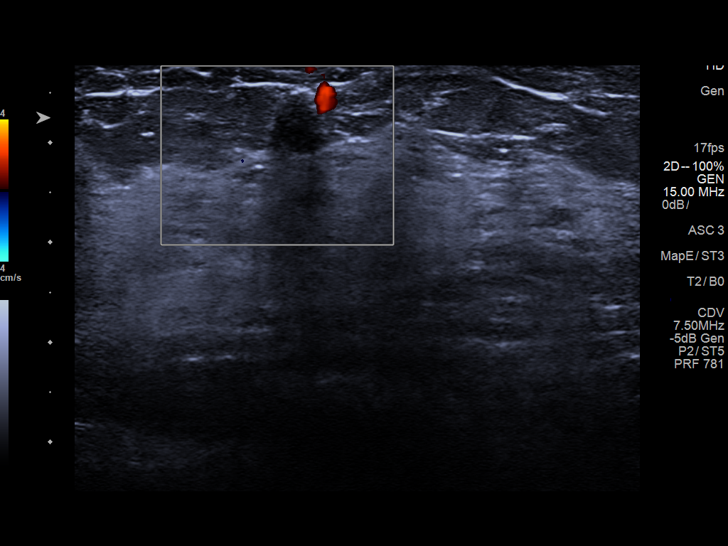
[im 5/14]
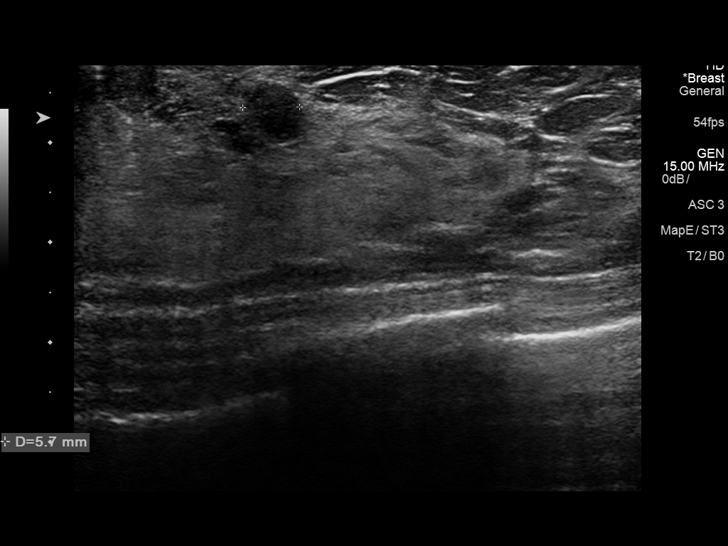
[im 6/14]
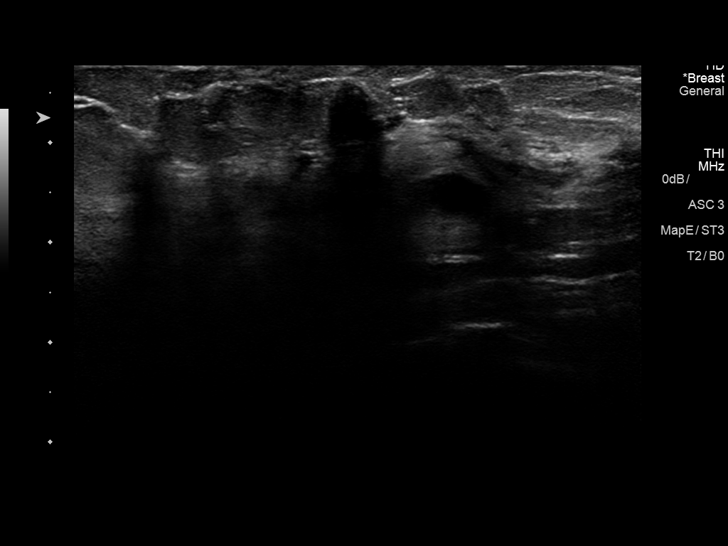
[im 7/14]
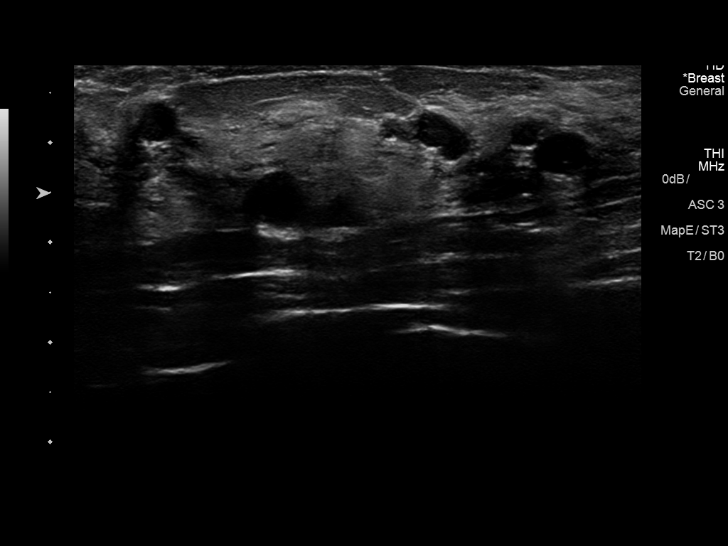
[im 8/14]
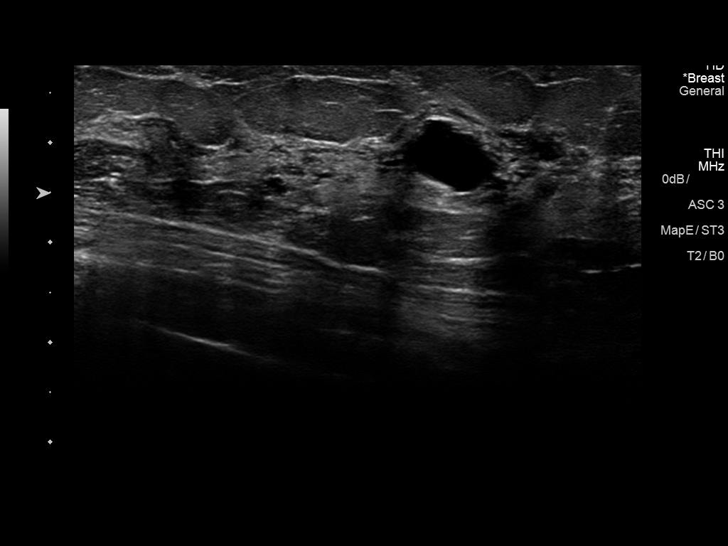
[im 9/14]
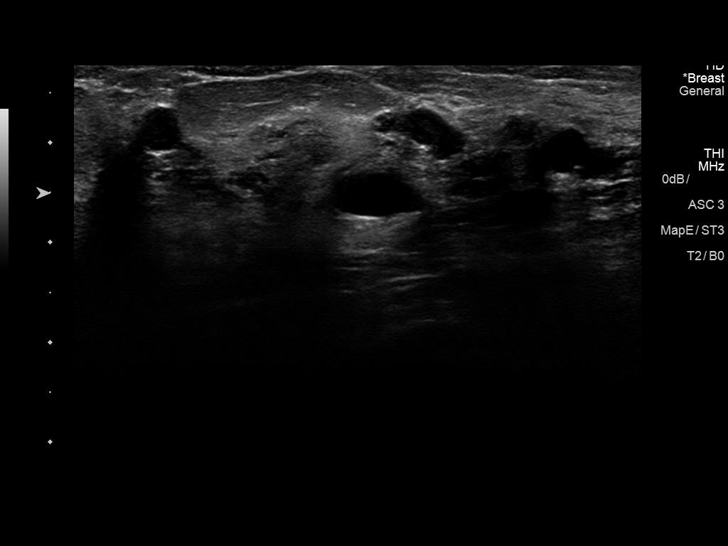
[im 10/14]
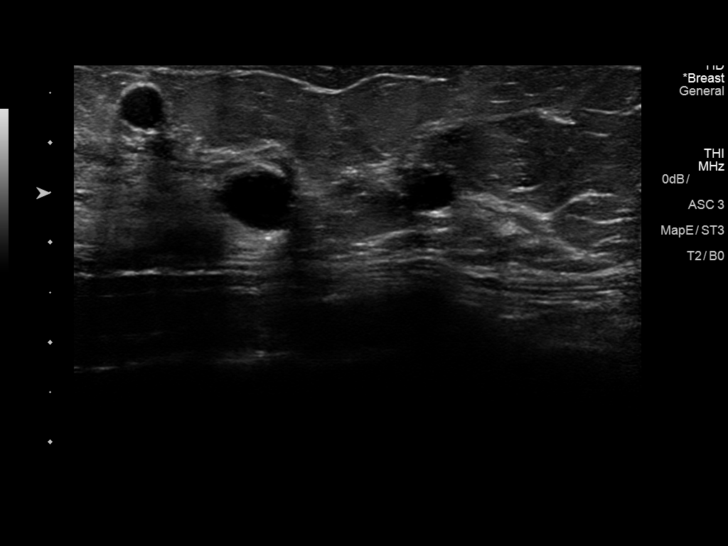
[im 12/14]
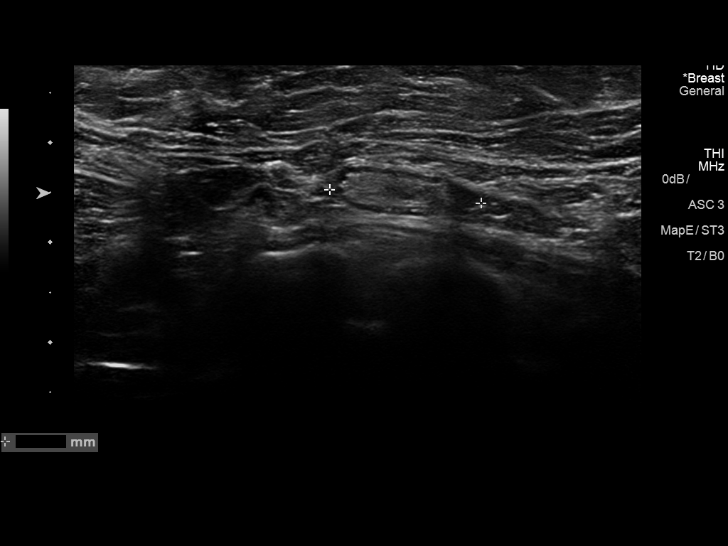
[im 13/14]
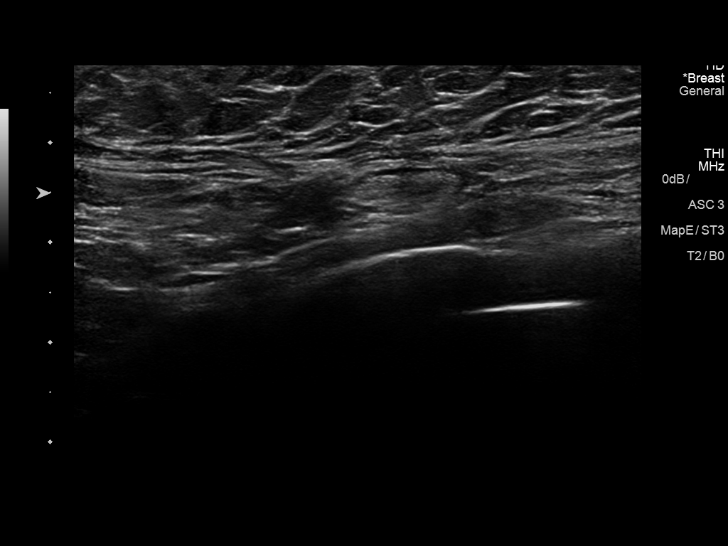
[im 14/14]
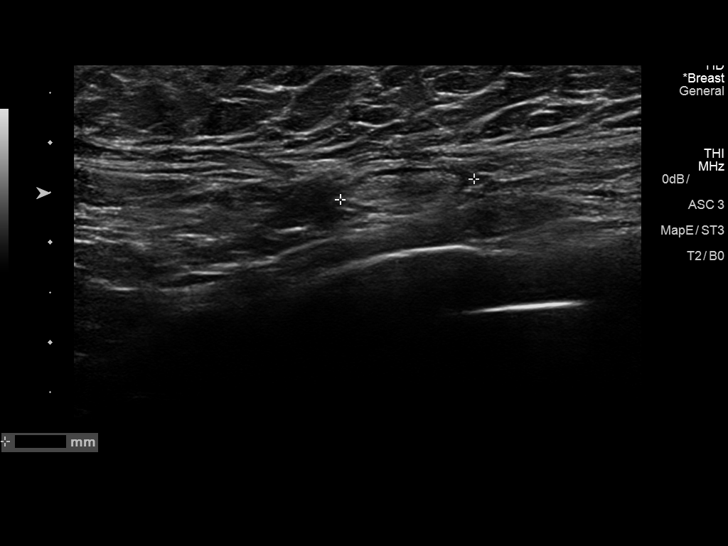

[12 of 14 positions shown; findings below may reference images not displayed]

ACR Breast Density Category c: The breast tissue is heterogeneously
dense, which may obscure small masses.
FINDINGS: A BB has been placed at the palpable site of concern. No suspicious
mammographic findings are identified deep to the palpable marker,
however due to the patient's dense breast tissue, ultrasound will be
performed for further evaluation.

Mammographic images were processed with CAD.

The patient has multiple mobile lumps in the inferior left breast on
physical exam. Exquisite tenderness is experience with palpation. I
did try to elicit some discharge with manual expression, but was
unsuccessful.

Ultrasound targeted to the palpable site in the inferior aspect of
the left breast demonstrates numerous anechoic circumscribed masses
with increased posterior acoustic enhancement. However, at [DATE], 1
cm from the nipple there is a superficial hypoechoic mass with
indistinct margins measuring 6 x 5 x 6 mm which demonstrates
posterior acoustic shadowing. No blood flow was identified within
the mass on color Doppler, however I suspect that this mass is
solid. Ultrasound of the left axilla demonstrates multiple
normal-appearing lymph nodes.
IMPRESSION: 1.  There is an indeterminate mass in the left breast at [DATE].

2. Numerous benign cysts are identified at the palpable painful site
of concern in the inferior left breast.

3.  No evidence of left axillary lymphadenopathy.

RECOMMENDATION:
1. Ultrasound-guided biopsy is recommended for the left breast mass
at [DATE]. The patient is scheduled for biopsy on 05/02/2017 at 1
p.m..

2. Clinical follow-up recommended for the palpable area of concern
in the left breast, and for the bilateral breast discharge. Any
further workup should be based on clinical grounds. I explained to
the patient through the assistance of a Spanish interpreter, that
she does have numerous cysts at this site, and therefore aspirating
1 of the cysts will likely not resolve her symptoms.

I have discussed the findings and recommendations with the patient.
Results were also provided in writing at the conclusion of the
visit. If applicable, a reminder letter will be sent to the patient
regarding the next appointment.

BI-RADS CATEGORY  4: Suspicious.

## 2018-07-11 ENCOUNTER — Ambulatory Visit: Payer: Self-pay | Admitting: Physician Assistant

## 2018-07-18 ENCOUNTER — Encounter: Payer: Self-pay | Admitting: Physician Assistant

## 2018-07-18 ENCOUNTER — Ambulatory Visit: Payer: Self-pay | Admitting: Physician Assistant

## 2018-07-18 DIAGNOSIS — R1013 Epigastric pain: Secondary | ICD-10-CM

## 2018-07-18 DIAGNOSIS — R1084 Generalized abdominal pain: Secondary | ICD-10-CM

## 2018-07-18 DIAGNOSIS — Z789 Other specified health status: Secondary | ICD-10-CM

## 2018-07-18 DIAGNOSIS — J302 Other seasonal allergic rhinitis: Secondary | ICD-10-CM

## 2018-07-18 MED ORDER — OMEPRAZOLE 20 MG PO CPDR: DELAYED_RELEASE_CAPSULE | ORAL | 11 refills | Status: DC

## 2018-07-18 NOTE — Progress Notes (Signed)
There were no vitals taken for this visit.   Subjective:    Patient ID: Joyce Roberts, female    DOB: [birth year], 48 y.o.   MRN: [id number]  HPI: Joyce Roberts is a 48 y.o. female presenting on 07/18/2018 for No chief complaint on file.   HPI   This is a telemedicine visit due to coronavirus pandemic that is being done via telephone as pt does not have a smartphone  I connected with  Joyce Roberts on 07/18/18 by a video enabled telemedicine application and verified that I am speaking with the correct person using two identifiers.   I discussed the limitations of evaluation and management by telemedicine. The patient expressed understanding and agreed to proceed.   She has been off the omeprazole about 2 wk.  She says Her stomach has felt heavy and bloated especially after eating  At last Office Visit pt was counseled to renew her [employer] care application.  She says she sent it in 3 months ago and hasn't heard anything on it.    She was on recall for GI in December.    Epic says pt was sent a letter but pt says she didn't get one.  Letter was likely in [place] and pt doesn't speak or read english.  Pt states no problems except the stomach but she thinks she has a sinus infection.    Pt has Headache and pain in eyes and around eyes and nose.  She says she has tried tyelnol for sinuses but it didn't help.  She feels like she has a cold but she doesn't have a runny nose but she feels like she has a cold due to the HA and eye pressure.    This started about 2 week ago and worse last 4 days.   A little bit of cough.  No fever or sob.  Her cough is Mostly in the morning and her throat hurts in the morning but then goes away.  She thinks ST due to allergies because it itches also.   Relevant past medical, surgical, family and social history reviewed and updated as indicated. Interim medical history since our last visit reviewed. Allergies and medications reviewed and  updated.   Current Outpatient Medications:  .  acetaminophen (TYLENOL) 500 MG tablet, Take 1,000 mg by mouth 2 (two) times daily as needed., Disp: , Rfl:  .  cetirizine (ZYRTEC) 10 MG tablet, Take 10 mg by mouth daily as needed for allergies., Disp: , Rfl:  .  ibuprofen (ADVIL,MOTRIN) 100 MG/5ML suspension, Take 400 mg by mouth 2 (two) times daily as needed., Disp: , Rfl:  .  omeprazole (PRILOSEC) 20 MG capsule, 1 PO BID. 1 POR BOCA DOS VECES AL DA (Patient not taking: Reported on 07/18/2018), Disp: 60 capsule, Rfl: 11    Review of Systems  Per HPI unless specifically indicated above     Objective:    There were no vitals taken for this visit.  Wt Readings from Last 3 Encounters:  04/23/18 139 lb (63 kg)  11/22/17 138 lb 12.8 oz (63 kg)  08/15/17 139 lb 12.8 oz (63.4 kg)    Physical Exam Pulmonary:     Effort: Pulmonary effort is normal. No respiratory distress.     Comments: Pt speaks in complete sentences without dyspnea Neurological:     Mental Status: She is alert and oriented to person, place, and time.  Psychiatric:        Mood and Affect: Mood  normal.          Assessment & Plan:   Encounter Diagnoses  Name Primary?  . Dyspepsia Yes  . Generalized abdominal pain   . Seasonal allergies   . Non-English speaking patient     -Pt is given phone number for [employer] counselr to check on her [employer] care application  -She is encouraged to  call GI for her follow up   -Discussed with pt that She needs to get back on her omeprazole   -recommended pt take zytec and sudafed for facial pressure.- pt to take daily.  She is to call next week if not improved  -pt to follow up for routine care in 6 months.  She is to contact office sooner prn

## 2018-07-23 NOTE — Progress Notes (Signed)
REVIEWED-NO ADDITIONAL RECOMMENDATIONS. 

## 2018-07-24 NOTE — Progress Notes (Signed)
REVIEWED-NO ADDITIONAL RECOMMENDATIONS. 

## 2018-07-25 ENCOUNTER — Other Ambulatory Visit: Payer: Self-pay | Admitting: Physician Assistant

## 2018-07-25 ENCOUNTER — Telehealth: Payer: Self-pay | Admitting: Student

## 2018-07-25 MED ORDER — AMOXICILLIN 500 MG PO CAPS: ORAL_CAPSULE | ORAL | 0 refills | Status: DC

## 2018-07-25 NOTE — Telephone Encounter (Signed)
PA sent rx for amoxicillin today, 07-25-18 to [facility] in [place] (pt's preferred pharmacy).  Pt requests diflucan for yeast infection since she "always gets them" after taking antibiotic. PA denied pt's request for diflucan. Pt can try OTC 7 day monistat if she gets a yeast infection. If after using & day monistat pt has no improvements pt can call the office. Pt verbalized understanding.

## 2019-01-07 ENCOUNTER — Encounter: Payer: Self-pay | Admitting: Physician Assistant

## 2019-01-07 ENCOUNTER — Ambulatory Visit: Payer: Self-pay | Admitting: Physician Assistant

## 2019-01-07 DIAGNOSIS — Z789 Other specified health status: Secondary | ICD-10-CM

## 2019-01-07 DIAGNOSIS — Z1231 Encounter for screening mammogram for malignant neoplasm of breast: Secondary | ICD-10-CM

## 2019-01-07 DIAGNOSIS — R1013 Epigastric pain: Secondary | ICD-10-CM

## 2019-01-07 MED ORDER — OMEPRAZOLE 20 MG PO CPDR: DELAYED_RELEASE_CAPSULE | ORAL | 11 refills | Status: DC

## 2019-01-07 NOTE — Progress Notes (Signed)
   There were no vitals taken for this visit.   Subjective:    Patient ID: Joyce Roberts, female    DOB: 05/05/1970, 48 y.o.   MRN: [id number]  HPI: BERNEICE DIEFENBACH is a 48 y.o. female presenting on 01/07/2019 for Follow-up   HPI  This is a telemedicine appointment due to coronavirus pandemic.  It is via telephone as pt does not have a video enabled device  I connected with  Joyce Roberts on 01/07/19 by a video enabled telemedicine application and verified that I am speaking with the correct person using two identifiers.   I discussed the limitations of evaluation and management by telemedicine. The patient expressed understanding and agreed to proceed.  Pt is at home.  Provider is at office    She says her stomach is doing better  She is not working  She is wearing a mask when she goes out  She is not exercising  She is having No anxiety or depression.     Relevant past medical, surgical, family and social history reviewed and updated as indicated. Interim medical history since our last visit reviewed. Allergies and medications reviewed and updated.    Current Outpatient Medications:  .  acetaminophen (TYLENOL) 500 MG tablet, Take 1,000 mg by mouth 2 (two) times daily as needed., Disp: , Rfl:  .  cetirizine (ZYRTEC) 10 MG tablet, Take 10 mg by mouth daily as needed for allergies., Disp: , Rfl:  .  omeprazole (PRILOSEC) 20 MG capsule, 1 PO BID. 1 POR BOCA DOS VECES AL DIA (Patient not taking: Reported on 01/07/2019), Disp: 60 capsule, Rfl: 11    Review of Systems  Per HPI unless specifically indicated above     Objective:    There were no vitals taken for this visit.  Wt Readings from Last 3 Encounters:  04/23/18 139 lb (63 kg)  11/22/17 138 lb 12.8 oz (63 kg)  08/15/17 139 lb 12.8 oz (63.4 kg)    Physical Exam Pulmonary:     Effort: No respiratory distress.  Neurological:     Mental Status: She is alert and oriented to person, place, and time.   Psychiatric:        Attention and Perception: Attention normal.        Speech: Speech normal.        Behavior: Behavior is cooperative.         Assessment & Plan:    Encounter Diagnoses  Name Primary?  . Dyspepsia Yes  . Not proficient in [place] language   . Encounter for screening mammogram for malignant neoplasm of breast      -will refer for screening Mammogram -pt to Continue omeprazole -she is encouraged to wear mask when she goes out to reduce risk of covid 19 -pt is to follow up 6 months.  She is to contact office sooner prn

## 2019-02-12 ENCOUNTER — Ambulatory Visit: Payer: Self-pay | Admitting: Physician Assistant

## 2019-02-12 ENCOUNTER — Other Ambulatory Visit: Payer: Self-pay | Admitting: *Deleted

## 2019-02-12 ENCOUNTER — Encounter: Payer: Self-pay | Admitting: Physician Assistant

## 2019-02-12 DIAGNOSIS — Z789 Other specified health status: Secondary | ICD-10-CM

## 2019-02-12 DIAGNOSIS — R519 Headache, unspecified: Secondary | ICD-10-CM

## 2019-02-12 DIAGNOSIS — R1110 Vomiting, unspecified: Secondary | ICD-10-CM

## 2019-02-12 DIAGNOSIS — Z20822 Contact with and (suspected) exposure to covid-19: Secondary | ICD-10-CM

## 2019-02-12 DIAGNOSIS — H9202 Otalgia, left ear: Secondary | ICD-10-CM

## 2019-02-12 DIAGNOSIS — R42 Dizziness and giddiness: Secondary | ICD-10-CM

## 2019-02-12 MED ORDER — AMOXICILLIN 500 MG PO CAPS: ORAL_CAPSULE | ORAL | 0 refills | Status: DC

## 2019-02-12 NOTE — Progress Notes (Signed)
There were no vitals taken for this visit.   Subjective:    Patient ID: Joyce Roberts, female    DOB: 09-06-1970, 48 y.o.   MRN: [id number]  HPI: Joyce Roberts is a 48 y.o. female presenting on 02/12/2019 for No chief complaint on file.   HPI   This is a telemedicine appointment due to coronavirus pandemic.  It is via telephone as patient does not have a video enabled device.  I connected with  Joyce Roberts on 02/12/19 by a video enabled telemedicine application and verified that I am speaking with the correct person using two identifiers.   I discussed the limitations of evaluation and management by telemedicine. The patient expressed understanding and agreed to proceed.  Patient is at home.  Provider is at office.    Patient complains of being sick for a little over a week now.  She complains of headache, dizziness, and some emesis.  Patient denies cough, fever.  She also has left ear pain and pain across her forehead.  The left ear pain is constant, with sometimes the pain being worse than at other times.  There is no pain in the right ear.  Patient is using some Tylenol and ibuprofen for to help her pain.  She denies diarrhea.  She says she is able to eat and drink.      She doesn't work outside the home.    She lives with her husband (who works Clinical cytogeneticist mfg)   and 11yo daughter (who doesn't work -   but she attends a Forensic psychologist class in person).  Neither are sick.    Pt is unaware of going to a large gathering.  She has visted family that she doesn't live with.    Relevant past medical, surgical, family and social history reviewed and updated as indicated. Interim medical history since our last visit reviewed. Allergies and medications reviewed and updated.   Current Outpatient Medications:  .  acetaminophen (TYLENOL) 500 MG tablet, Take 1,000 mg by mouth 2 (two) times daily as needed., Disp: , Rfl:  .  cetirizine (ZYRTEC) 10 MG tablet, Take 10 mg by mouth daily as  needed for allergies., Disp: , Rfl:  .  omeprazole (PRILOSEC) 20 MG capsule, 1 PO BID. 1 POR BOCA DOS VECES AL DIA, Disp: 60 capsule, Rfl: 11    Review of Systems  Per HPI unless specifically indicated above     Objective:    There were no vitals taken for this visit.  Wt Readings from Last 3 Encounters:  04/23/18 139 lb (63 kg)  11/22/17 138 lb 12.8 oz (63 kg)  08/15/17 139 lb 12.8 oz (63.4 kg)    Physical Exam Pulmonary:     Effort: No respiratory distress.  Neurological:     Mental Status: She is alert and oriented to person, place, and time.  Psychiatric:        Attention and Perception: Attention normal.        Speech: Speech normal.        Behavior: Behavior is cooperative.         Assessment & Plan:     Encounter Diagnoses  Name Primary?  Marland Kitchen Nonintractable headache, unspecified chronicity pattern, unspecified headache type Yes  . Suspected 2019 novel coronavirus infection   . Left ear pain   . Non-intractable vomiting, presence of nausea not specified, unspecified vomiting type   . Dizziness   . Non-English speaking patient  Pt to get covid tested today.  Counseled pt on self-isolation.  Will prescribe amoxil for possible otitis or sinusitis.  Pt counseled to use drive thru/do NOT enter pharmacy  Pt to stay home and self care with OTCs.  She is to contact office if worsens or persists or if has new symptoms.   Counseled to go to ER if sob or intractable vomiting.

## 2019-02-13 ENCOUNTER — Other Ambulatory Visit: Payer: Self-pay | Admitting: Physician Assistant

## 2019-02-13 DIAGNOSIS — Z1231 Encounter for screening mammogram for malignant neoplasm of breast: Secondary | ICD-10-CM

## 2019-02-14 LAB — NOVEL CORONAVIRUS, NAA: SARS-CoV-2, NAA: NOT DETECTED

## 2019-02-27 ENCOUNTER — Other Ambulatory Visit: Payer: Self-pay

## 2019-02-27 ENCOUNTER — Ambulatory Visit (HOSPITAL_COMMUNITY)
Admission: RE | Admit: 2019-02-27 | Discharge: 2019-02-27 | Disposition: A | Discharge status: Home / Self Care | Payer: Self-pay | Source: Ambulatory Visit | Attending: Physician Assistant | Admitting: Physician Assistant

## 2019-02-27 DIAGNOSIS — Z1231 Encounter for screening mammogram for malignant neoplasm of breast: Secondary | ICD-10-CM | POA: Insufficient documentation

## 2019-03-26 ENCOUNTER — Ambulatory Visit: Payer: Self-pay | Admitting: Physician Assistant

## 2019-03-26 ENCOUNTER — Other Ambulatory Visit: Payer: Self-pay

## 2019-03-26 ENCOUNTER — Ambulatory Visit: Payer: Self-pay | Attending: Internal Medicine

## 2019-03-26 ENCOUNTER — Encounter: Payer: Self-pay | Admitting: Physician Assistant

## 2019-03-26 DIAGNOSIS — R430 Anosmia: Secondary | ICD-10-CM

## 2019-03-26 DIAGNOSIS — Z20822 Contact with and (suspected) exposure to covid-19: Secondary | ICD-10-CM

## 2019-03-26 DIAGNOSIS — R059 Cough, unspecified: Secondary | ICD-10-CM

## 2019-03-26 DIAGNOSIS — R05 Cough: Secondary | ICD-10-CM

## 2019-03-26 DIAGNOSIS — R0981 Nasal congestion: Secondary | ICD-10-CM

## 2019-03-26 DIAGNOSIS — Z789 Other specified health status: Secondary | ICD-10-CM

## 2019-03-26 DIAGNOSIS — R509 Fever, unspecified: Secondary | ICD-10-CM

## 2019-03-26 NOTE — Progress Notes (Signed)
   There were no vitals taken for this visit.   Subjective:    Patient ID: Joyce Roberts, female    DOB: [birth year], 48 y.o.   MRN: [id number]  HPI: Joyce Roberts is a 48 y.o. female presenting on 03/26/2019 for No chief complaint on file.   HPI   This is a telemedicine appointment due to coronavirus pandemic.  It is via Telephone because pt says her mobile phone broke and she has no other video enabled device.  I connected with  Joyce Roberts on 03/26/19 by a video enabled telemedicine application and verified that I am speaking with the correct person using two identifiers.   I discussed the limitations of evaluation and management by telemedicine. The patient expressed understanding and agreed to proceed.  Pt is at home.  Provider and translator are at office.   Pt says she has been Sick since Saturday.  She says she Feels very tired, has some cough, and stuffy nose and congestion.    No appetite.   She reports she Lost taste and smell.  She says she has had Some fever to Tmax 102.    Pt says she is Not working now.  She Lives with her spouse and daughter Loistine Simas).  She says her husband and daughter are both sick-  They have not been tested.    Relevant past medical, surgical, family and social history reviewed and updated as indicated. Interim medical history since our last visit reviewed. Allergies and medications reviewed and updated.   Current Outpatient Medications:  .  acetaminophen (TYLENOL) 500 MG tablet, Take 1,000 mg by mouth 2 (two) times daily as needed., Disp: , Rfl:  .  cetirizine (ZYRTEC) 10 MG tablet, Take 10 mg by mouth daily as needed for allergies., Disp: , Rfl:  .  omeprazole (PRILOSEC) 20 MG capsule, 1 PO BID. 1 POR BOCA DOS VECES AL DIA, Disp: 60 capsule, Rfl: 11    Review of Systems  Per HPI unless specifically indicated above     Objective:    There were no vitals taken for this visit.  Wt Readings from Last 3 Encounters:  04/23/18 139  lb (63 kg)  11/22/17 138 lb 12.8 oz (63 kg)  08/15/17 139 lb 12.8 oz (63.4 kg)    Physical Exam Pulmonary:     Effort: Pulmonary effort is normal. No respiratory distress.  Neurological:     Mental Status: She is alert and oriented to person, place, and time.  Psychiatric:        Attention and Perception: Attention normal.        Speech: Speech normal.        Behavior: Behavior is cooperative.         Assessment & Plan:    Encounter Diagnoses  Name Primary?  . Suspected 2019 novel coronavirus infection Yes  . Fever, unspecified fever cause   . Anosmia   . Head congestion   . Cough   . Non-English speaking patient      Pt is scheduled for covid testing today.    Reviewed information on quarantining Counseled pt on self treatment and discussed when to go to ER (problems breathing, CP, confusion) Pt to follow up as scheduled.  She is to contact office sooner prn (pt gives permission to discuss test result with her daughter who speaks english)

## 2019-03-27 LAB — NOVEL CORONAVIRUS, NAA: SARS-CoV-2, NAA: NOT DETECTED

## 2019-03-28 ENCOUNTER — Telehealth: Payer: Self-pay | Admitting: Physician Assistant

## 2019-03-28 NOTE — Telephone Encounter (Signed)
Called pt and with the help of her daughter, [name], who is helping to translate, discussed results of covid 19 test.  All questions were answered

## 2019-04-02 ENCOUNTER — Other Ambulatory Visit: Payer: Self-pay | Admitting: Physician Assistant

## 2019-04-02 MED ORDER — BENZONATATE 100 MG PO CAPS: ORAL_CAPSULE | ORAL | 0 refills | Status: DC

## 2019-04-04 ENCOUNTER — Emergency Department (HOSPITAL_COMMUNITY)
Admission: EM | Admit: 2019-04-04 | Discharge: 2019-04-04 | Disposition: A | Discharge status: Home / Self Care | Payer: Self-pay | Attending: Emergency Medicine | Admitting: Emergency Medicine

## 2019-04-04 ENCOUNTER — Emergency Department (HOSPITAL_COMMUNITY): Payer: Self-pay

## 2019-04-04 ENCOUNTER — Other Ambulatory Visit: Payer: Self-pay

## 2019-04-04 ENCOUNTER — Encounter (HOSPITAL_COMMUNITY): Payer: Self-pay | Admitting: Emergency Medicine

## 2019-04-04 DIAGNOSIS — Z79899 Other long term (current) drug therapy: Secondary | ICD-10-CM | POA: Insufficient documentation

## 2019-04-04 DIAGNOSIS — Z20822 Contact with and (suspected) exposure to covid-19: Secondary | ICD-10-CM | POA: Insufficient documentation

## 2019-04-04 DIAGNOSIS — R0602 Shortness of breath: Secondary | ICD-10-CM | POA: Insufficient documentation

## 2019-04-04 DIAGNOSIS — B349 Viral infection, unspecified: Secondary | ICD-10-CM | POA: Insufficient documentation

## 2019-04-04 LAB — CBC WITH DIFFERENTIAL/PLATELET
Abs Immature Granulocytes: 0.03 10*3/uL (ref 0.00–0.07)
Basophils Absolute: 0 10*3/uL (ref 0.0–0.1)
Basophils Relative: 0 %
Eosinophils Absolute: 0.1 10*3/uL (ref 0.0–0.5)
Eosinophils Relative: 1 %
HCT: 36.3 % (ref 36.0–46.0)
Hemoglobin: 11.1 g/dL — ABNORMAL LOW (ref 12.0–15.0)
Immature Granulocytes: 0 %
Lymphocytes Relative: 20 %
Lymphs Abs: 1.5 10*3/uL (ref 0.7–4.0)
MCH: 24.6 pg — ABNORMAL LOW (ref 26.0–34.0)
MCHC: 30.6 g/dL (ref 30.0–36.0)
MCV: 80.3 fL (ref 80.0–100.0)
Monocytes Absolute: 0.5 10*3/uL (ref 0.1–1.0)
Monocytes Relative: 8 %
Neutro Abs: 5 10*3/uL (ref 1.7–7.7)
Neutrophils Relative %: 71 %
Platelets: 285 10*3/uL (ref 150–400)
RBC: 4.52 MIL/uL (ref 3.87–5.11)
RDW: 16.3 % — ABNORMAL HIGH (ref 11.5–15.5)
WBC: 7.1 10*3/uL (ref 4.0–10.5)
nRBC: 0 % (ref 0.0–0.2)

## 2019-04-04 LAB — BASIC METABOLIC PANEL
Anion gap: 5 (ref 5–15)
BUN: 12 mg/dL (ref 6–20)
CO2: 26 mmol/L (ref 22–32)
Calcium: 8.7 mg/dL — ABNORMAL LOW (ref 8.9–10.3)
Chloride: 106 mmol/L (ref 98–111)
Creatinine, Ser: 0.79 mg/dL (ref 0.44–1.00)
GFR calc Af Amer: >60 mL/min (ref >60)
GFR calc non Af Amer: >60 mL/min (ref >60)
Glucose, Bld: 97 mg/dL (ref 70–99)
Potassium: 4.3 mmol/L (ref 3.5–5.1)
Sodium: 137 mmol/L (ref 135–145)

## 2019-04-04 LAB — URINALYSIS, ROUTINE W REFLEX MICROSCOPIC
Bacteria, UA: NONE SEEN
Bilirubin Urine: NEGATIVE
Glucose, UA: NEGATIVE mg/dL
Ketones, ur: NEGATIVE mg/dL
Leukocytes,Ua: NEGATIVE
Nitrite: NEGATIVE
Protein, ur: NEGATIVE mg/dL
Specific Gravity, Urine: 1.019 (ref 1.005–1.030)
pH: 7 (ref 5.0–8.0)

## 2019-04-04 LAB — TROPONIN I (HIGH SENSITIVITY): Troponin I (High Sensitivity): <2 ng/L (ref <18)

## 2019-04-04 LAB — POC SARS CORONAVIRUS 2 AG -  ED: SARS Coronavirus 2 Ag: NEGATIVE

## 2019-04-04 LAB — D-DIMER, QUANTITATIVE: D-Dimer, Quant: 0.27 ug/mL-FEU (ref 0.00–0.50)

## 2019-04-04 MED ORDER — ALBUTEROL SULFATE HFA 108 (90 BASE) MCG/ACT IN AERS
2.0000 | INHALATION_SPRAY | Freq: Once | RESPIRATORY_TRACT | Status: AC
  Administered 2019-04-04: 2 via RESPIRATORY_TRACT
  Filled 2019-04-04: qty 6.7

## 2019-04-04 NOTE — ED Notes (Signed)
Family tested pos for covid   Pt has tested negative but reports sx  Here for eval of this and dysuria   Has not sought tx with PCP

## 2019-04-04 NOTE — Discharge Instructions (Addendum)
Your labwork was reassuring today. Your chest xray findings are likely related to your possible COVID 19 infection. I do not think it is a bacterial pneumonia today. Given the fact that your family have all tested positive and you live with them it is very likely that you are positive as well. We have sent out a test for you that can take 2-3 days to return. You will receive a call if it is positive. You will not receive a call if it is negative. Please log in to mychart to check your results if you have not heard in a couple of days. It is important to self isolate while you are awaiting your results.   If positive continue to quarantine for 14 days from when your symptoms started. If negative you may resume your normal daily activity.   Please follow up with your PCP regarding your ED visit today as well as your symptoms. Use the inhaler as needed for shortness of breath. Take Ibuprofen as needed for your chest pain.

## 2019-04-04 NOTE — ED Notes (Signed)
Lab to room

## 2019-04-04 NOTE — ED Triage Notes (Signed)
Pt reports cough, intermittent dizziness, shortness of breath, fever, chills,loss of taste, left flank pain, and dysuria. Pt reports was tested for covid on 12/26 and reports was told on 12/29 negative. Pt reports spouse and daughter are both positive and are feeling better. Pt states no improvement of symptoms. nad noted.
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
## 2019-04-06 LAB — NOVEL CORONAVIRUS, NAA (HOSP ORDER, SEND-OUT TO REF LAB; TAT 18-24 HRS): SARS-CoV-2, NAA: NOT DETECTED

## 2019-04-24 ENCOUNTER — Other Ambulatory Visit: Payer: Self-pay | Admitting: Physician Assistant

## 2019-04-24 ENCOUNTER — Telehealth: Payer: Self-pay | Admitting: Student

## 2019-04-24 MED ORDER — AMOXICILLIN 500 MG PO CAPS: 500.0000 mg | ORAL_CAPSULE | Freq: Three times a day (TID) | ORAL | 0 refills | Status: AC

## 2019-04-24 NOTE — Telephone Encounter (Signed)
Pt called c/o HA, facial pain, nasal congestion that began around end of December 2020 when she tested negative for COVID. Pt also c/o phlegm that began yesterday, 04-23-19. Pt states she has been taking tylenol and advil to help with HA and facial pain but does not help. Pt has been doing warm salt water gargles, using OTC decongestion spray, and OTC sinus relief tablets. Pt believes she may have a sinus infection.  PA rx amoxicillin to pt's preferred pharmacy.  Pt agrees and requests rx to be sent to [facility] in [place]. Pt to continue OTC meds if helpful to alleviate sx. Pt verbalized understanding.

## 2019-07-08 ENCOUNTER — Ambulatory Visit: Payer: Self-pay | Admitting: Physician Assistant

## 2019-11-04 ENCOUNTER — Ambulatory Visit: Payer: Self-pay | Admitting: Physician Assistant

## 2019-11-06 ENCOUNTER — Ambulatory Visit: Payer: Self-pay | Admitting: Physician Assistant

## 2019-11-06 ENCOUNTER — Other Ambulatory Visit (HOSPITAL_COMMUNITY)
Admission: RE | Admit: 2019-11-06 | Discharge: 2019-11-06 | Disposition: A | Discharge status: Home / Self Care | Payer: Self-pay | Source: Ambulatory Visit | Attending: Physician Assistant | Admitting: Physician Assistant

## 2019-11-06 ENCOUNTER — Encounter: Payer: Self-pay | Admitting: Physician Assistant

## 2019-11-06 VITALS — BP 120/88 | HR 87 | Temp 98.2°F | Ht 60.5 in | Wt 146.5 lb

## 2019-11-06 DIAGNOSIS — R5383 Other fatigue: Secondary | ICD-10-CM | POA: Insufficient documentation

## 2019-11-06 DIAGNOSIS — R350 Frequency of micturition: Secondary | ICD-10-CM

## 2019-11-06 DIAGNOSIS — Z862 Personal history of diseases of the blood and blood-forming organs and certain disorders involving the immune mechanism: Secondary | ICD-10-CM

## 2019-11-06 DIAGNOSIS — N926 Irregular menstruation, unspecified: Secondary | ICD-10-CM

## 2019-11-06 DIAGNOSIS — Z789 Other specified health status: Secondary | ICD-10-CM

## 2019-11-06 DIAGNOSIS — R1013 Epigastric pain: Secondary | ICD-10-CM

## 2019-11-06 DIAGNOSIS — N6459 Other signs and symptoms in breast: Secondary | ICD-10-CM

## 2019-11-06 LAB — BASIC METABOLIC PANEL
Anion gap: 11 (ref 5–15)
BUN: 19 mg/dL (ref 6–20)
CO2: 24 mmol/L (ref 22–32)
Calcium: 9 mg/dL (ref 8.9–10.3)
Chloride: 105 mmol/L (ref 98–111)
Creatinine, Ser: 0.77 mg/dL (ref 0.44–1.00)
GFR calc Af Amer: >60 mL/min (ref >60)
GFR calc non Af Amer: >60 mL/min (ref >60)
Glucose, Bld: 99 mg/dL (ref 70–99)
Potassium: 4.3 mmol/L (ref 3.5–5.1)
Sodium: 140 mmol/L (ref 135–145)

## 2019-11-06 LAB — POCT URINALYSIS DIPSTICK
Bilirubin, UA: NEGATIVE
Glucose, UA: NEGATIVE
Leukocytes, UA: NEGATIVE
Nitrite, UA: NEGATIVE
Protein, UA: NEGATIVE
Spec Grav, UA: >=1.03 — AB (ref 1.010–1.025)
Urobilinogen, UA: 0.2 E.U./dL
pH, UA: 5.5 (ref 5.0–8.0)

## 2019-11-06 LAB — TSH: TSH: 3.101 u[IU]/mL (ref 0.350–4.500)

## 2019-11-06 LAB — CBC
HCT: 37.4 % (ref 36.0–46.0)
Hemoglobin: 11.4 g/dL — ABNORMAL LOW (ref 12.0–15.0)
MCH: 23.7 pg — ABNORMAL LOW (ref 26.0–34.0)
MCHC: 30.5 g/dL (ref 30.0–36.0)
MCV: 77.6 fL — ABNORMAL LOW (ref 80.0–100.0)
Platelets: 298 10*3/uL (ref 150–400)
RBC: 4.82 MIL/uL (ref 3.87–5.11)
RDW: 18 % — ABNORMAL HIGH (ref 11.5–15.5)
WBC: 6.6 10*3/uL (ref 4.0–10.5)
nRBC: 0 % (ref 0.0–0.2)

## 2019-11-06 NOTE — Progress Notes (Signed)
BP 120/88   Pulse 87   Temp 98.2 F (36.8 C)   Ht 5' 0.5" (1.537 m)   Wt 146 lb 8 oz (66.5 kg)   SpO2 98%   BMI 28.14 kg/m    Subjective:    Patient ID: Joyce Roberts, female    DOB: [birth year], 49 y.o.   MRN: [id number]  HPI: Joyce Roberts is a 49 y.o. female presenting on 11/06/2019 for Follow-up   HPI  Pt had a negative covid 19 screening questionnaire.    Pt is 18yoF with GERD who is in office today for routine follow up.  Pt is not feeling well.  She says she Feels tired, weak, and has a HA.  She says her GERD is well controlled.  She does not work.  She does not exercise.  She denies depression.    She says her menses are longer and heavier.    Pt says one breast, the left one, has gotten bigger and it hurts sometimes.  Her mammogram in december 2020 was normal.    Pt says she does not have a lump or nipple discharge.  Pt also says her urine is just a little bit when she has the urge to go.  No burning.    Relevant past medical, surgical, family and social history reviewed and updated as indicated. Interim medical history since our last visit reviewed. Allergies and medications reviewed and updated.   Current Outpatient Medications:  .  acetaminophen (TYLENOL) 500 MG tablet, Take 1,000 mg by mouth 2 (two) times daily as needed., Disp: , Rfl:  .  cetirizine (ZYRTEC) 10 MG tablet, Take 10 mg by mouth daily as needed for allergies., Disp: , Rfl:  .  Ibuprofen (ADVIL PO), Take 1 tablet by mouth daily., Disp: , Rfl:  .  omeprazole (PRILOSEC) 20 MG capsule, 1 PO BID. 1 POR BOCA DOS VECES AL DIA (Patient taking differently: Take 20 mg by mouth daily. 1 PO BID. 1 POR BOCA DOS VECES AL DIA), Disp: 60 capsule, Rfl: 11    Review of Systems  Per HPI unless specifically indicated above     Objective:    BP 120/88   Pulse 87   Temp 98.2 F (36.8 C)   Ht 5' 0.5" (1.537 m)   Wt 146 lb 8 oz (66.5 kg)   SpO2 98%   BMI 28.14 kg/m   Wt Readings from Last 3  Encounters:  11/06/19 146 lb 8 oz (66.5 kg)  04/04/19 135 lb (61.2 kg)  04/23/18 139 lb (63 kg)    Physical Exam Vitals reviewed.  Constitutional:      General: She is not in acute distress.    Appearance: She is well-developed. She is not ill-appearing.  HENT:     Head: Normocephalic and atraumatic.  Cardiovascular:     Rate and Rhythm: Normal rate and regular rhythm.  Pulmonary:     Effort: Pulmonary effort is normal.     Breath sounds: Normal breath sounds.  Abdominal:     General: Bowel sounds are normal.     Palpations: Abdomen is soft. There is no mass.     Tenderness: There is no abdominal tenderness.  Musculoskeletal:     Cervical back: Neck supple.     Right lower leg: No edema.     Left lower leg: No edema.  Lymphadenopathy:     Cervical: No cervical adenopathy.  Skin:    General: Skin is warm and dry.  Neurological:  Mental Status: She is alert and oriented to person, place, and time.  Psychiatric:        Behavior: Behavior normal.            Assessment & Plan:   Encounter Diagnoses  Name Primary?  . Other fatigue Yes  . History of anemia   . Abnormal menses   . Non-English speaking patient   . Urinary frequency   . Breast complaint   . Dyspepsia       -encouraged pt to Exercise regularly to help her energy levels -will check Labs -will refer for diagnostic mammogram -discussed with pt that menses likely irregular now due to peri-menopause -pt to follow up in 6 weeks for recheck.

## 2019-12-09 ENCOUNTER — Other Ambulatory Visit (HOSPITAL_COMMUNITY): Payer: Self-pay | Admitting: Physician Assistant

## 2019-12-09 DIAGNOSIS — N644 Mastodynia: Secondary | ICD-10-CM

## 2019-12-11 ENCOUNTER — Other Ambulatory Visit (HOSPITAL_COMMUNITY): Payer: Self-pay | Admitting: Physician Assistant

## 2019-12-11 DIAGNOSIS — N644 Mastodynia: Secondary | ICD-10-CM

## 2019-12-16 ENCOUNTER — Ambulatory Visit: Payer: Self-pay | Admitting: Physician Assistant

## 2019-12-17 ENCOUNTER — Ambulatory Visit: Payer: Self-pay | Admitting: Physician Assistant

## 2019-12-24 ENCOUNTER — Other Ambulatory Visit: Payer: Self-pay

## 2019-12-24 ENCOUNTER — Ambulatory Visit: Payer: Self-pay | Admitting: Physician Assistant

## 2019-12-24 ENCOUNTER — Ambulatory Visit (HOSPITAL_COMMUNITY)
Admission: RE | Admit: 2019-12-24 | Discharge: 2019-12-24 | Disposition: A | Discharge status: Home / Self Care | Payer: Self-pay | Source: Ambulatory Visit | Attending: Physician Assistant | Admitting: Physician Assistant

## 2019-12-24 ENCOUNTER — Encounter: Payer: Self-pay | Admitting: Physician Assistant

## 2019-12-24 VITALS — BP 120/82 | HR 90 | Temp 98.1°F | Ht 60.5 in | Wt 149.4 lb

## 2019-12-24 DIAGNOSIS — N951 Menopausal and female climacteric states: Secondary | ICD-10-CM

## 2019-12-24 DIAGNOSIS — R350 Frequency of micturition: Secondary | ICD-10-CM

## 2019-12-24 DIAGNOSIS — N644 Mastodynia: Secondary | ICD-10-CM | POA: Insufficient documentation

## 2019-12-24 DIAGNOSIS — Z789 Other specified health status: Secondary | ICD-10-CM

## 2019-12-24 DIAGNOSIS — R5383 Other fatigue: Secondary | ICD-10-CM

## 2019-12-24 LAB — POCT URINALYSIS DIPSTICK
Bilirubin, UA: NEGATIVE
Glucose, UA: NEGATIVE
Ketones, UA: NEGATIVE
Leukocytes, UA: NEGATIVE
Nitrite, UA: NEGATIVE
Protein, UA: NEGATIVE
Spec Grav, UA: 1.015 (ref 1.010–1.025)
Urobilinogen, UA: 0.2 E.U./dL
pH, UA: 6 (ref 5.0–8.0)

## 2019-12-24 MED ORDER — SULFAMETHOXAZOLE-TRIMETHOPRIM 800-160 MG PO TABS: 1.0000 | ORAL_TABLET | Freq: Two times a day (BID) | ORAL | 0 refills | Status: DC

## 2019-12-24 MED ORDER — DULOXETINE HCL 30 MG PO CPEP: 30.0000 mg | ORAL_CAPSULE | Freq: Every day | ORAL | 3 refills | Status: DC

## 2019-12-24 NOTE — Progress Notes (Signed)
BP 120/82   Pulse 90   Temp 98.1 F (36.7 C)   Ht 5' 0.5" (1.537 m)   Wt 149 lb 6.4 oz (67.8 kg)   SpO2 99%   BMI 28.70 kg/m    Subjective:    Patient ID: Joyce Roberts, female    DOB: 1970-07-31, 49 y.o.   MRN: [id number]  HPI: Joyce Roberts is a 49 y.o. female presenting on 12/24/2019 for Follow-up   HPI    Pt had a negative covid 19 screening questionnaire.    Pt is 49yoF with follow up for fatigue and other complaints.  She says she is walking every 3rd day for 1/2 hr.  She does not work She says she doesn't have time for more because she goes to school with her 21yo daughter most days.    She is hurting in the breast and stomach.  She says she always feels full. She had colonoscopy 2015 with recomendation to repeat 10 years.    She says she is not sleeping well.  She says she gets hot flashes.  She says her last period was in July.     Relevant past medical, surgical, family and social history reviewed and updated as indicated. Interim medical history since our last visit reviewed. Allergies and medications reviewed and updated.   Current Outpatient Medications:  .  acetaminophen (TYLENOL) 500 MG tablet, Take 1,000 mg by mouth 2 (two) times daily as needed., Disp: , Rfl:  .  cetirizine (ZYRTEC) 10 MG tablet, Take 10 mg by mouth daily as needed for allergies., Disp: , Rfl:  .  Ferrous Sulfate (IRON PO), Take 1 tablet by mouth daily., Disp: , Rfl:  .  Ibuprofen (ADVIL PO), Take 1 tablet by mouth daily., Disp: , Rfl:  .  Multiple Vitamin (MULTIVITAMIN) tablet, Take 1 tablet by mouth daily., Disp: , Rfl:  .  omeprazole (PRILOSEC) 20 MG capsule, 1 PO BID. 1 POR BOCA DOS VECES AL DIA (Patient taking differently: Take 20 mg by mouth daily. 1 PO BID. 1 POR BOCA DOS VECES AL DIA), Disp: 60 capsule, Rfl: 11   Review of Systems  Per HPI unless specifically indicated above     Objective:    BP 120/82   Pulse 90   Temp 98.1 F (36.7 C)   Ht 5' 0.5"  (1.537 m)   Wt 149 lb 6.4 oz (67.8 kg)   SpO2 99%   BMI 28.70 kg/m   Wt Readings from Last 3 Encounters:  12/24/19 149 lb 6.4 oz (67.8 kg)  11/06/19 146 lb 8 oz (66.5 kg)  04/04/19 135 lb (61.2 kg)    Physical Exam Vitals reviewed.  Constitutional:      Appearance: She is well-developed.  HENT:     Head: Normocephalic and atraumatic.  Cardiovascular:     Rate and Rhythm: Normal rate and regular rhythm.  Pulmonary:     Effort: Pulmonary effort is normal.     Breath sounds: Normal breath sounds.  Abdominal:     General: Bowel sounds are normal.     Palpations: Abdomen is soft. There is no mass.     Tenderness: There is no abdominal tenderness.  Musculoskeletal:     Cervical back: Neck supple.     Right lower leg: No edema.     Left lower leg: No edema.  Lymphadenopathy:     Cervical: No cervical adenopathy.  Skin:    General: Skin is warm and dry.  Neurological:  Mental Status: She is alert and oriented to person, place, and time.  Psychiatric:        Attention and Perception: Attention normal.        Speech: Speech normal.        Behavior: Behavior is cooperative.           Assessment & Plan:   Encounter Diagnoses  Name Primary?  . Other fatigue Yes  . Menopausal symptoms   . Urinary frequency   . Non-English speaking patient      -rx septra ds for cystitis -rx cymbalta for menopausal symtpoms -pt to follow up 6 wk to check on cymbalta.  Pt to contact office sooner prn

## 2020-01-28 ENCOUNTER — Encounter: Payer: Self-pay | Admitting: Physician Assistant

## 2020-01-28 ENCOUNTER — Ambulatory Visit: Payer: Self-pay | Admitting: Physician Assistant

## 2020-01-28 VITALS — BP 130/80 | HR 88 | Temp 98.1°F | Ht 60.5 in | Wt 147.5 lb

## 2020-01-28 DIAGNOSIS — N951 Menopausal and female climacteric states: Secondary | ICD-10-CM

## 2020-01-28 DIAGNOSIS — R5383 Other fatigue: Secondary | ICD-10-CM

## 2020-01-28 DIAGNOSIS — R350 Frequency of micturition: Secondary | ICD-10-CM

## 2020-01-28 DIAGNOSIS — Z789 Other specified health status: Secondary | ICD-10-CM

## 2020-01-28 LAB — POCT URINALYSIS DIPSTICK
Bilirubin, UA: NEGATIVE
Glucose, UA: NEGATIVE
Ketones, UA: NEGATIVE
Leukocytes, UA: NEGATIVE
Nitrite, UA: NEGATIVE
Protein, UA: NEGATIVE
Spec Grav, UA: 1.02 (ref 1.010–1.025)
Urobilinogen, UA: 0.2 E.U./dL
pH, UA: 7 (ref 5.0–8.0)

## 2020-01-28 NOTE — Progress Notes (Signed)
BP 130/80   Pulse 88   Temp 98.1 F (36.7 C)   Ht 5' 0.5" (1.537 m)   Wt 147 lb 8 oz (66.9 kg)   SpO2 98%   BMI 28.33 kg/m    Subjective:    Patient ID: Joyce Roberts, female    DOB: 1971/03/27, 49 y.o.   MRN: [id number]  HPI: IDELL Roberts is a 49 y.o. female presenting on 01/28/2020 for Fatigue and Hot Flashes   HPI    Pt had a negative covid 19 screening questionnaire.      Pt says cymbalta helped but she didn't get it refilled.  She plans to get it refilled.  It helped her hot flashes but not so much the fatigue.    Pt reports still having urinary frequency and decreased quantity.  She drinks 1 soda/day.   She drinks about 4 bottles of water daily.   She was treated with septra after an unremarkable UA about a month ago.       Relevant past medical, surgical, family and social history reviewed and updated as indicated. Interim medical history since our last visit reviewed. Allergies and medications reviewed and updated.   Current Outpatient Medications:  .  acetaminophen (TYLENOL) 500 MG tablet, Take 1,000 mg by mouth 2 (two) times daily as needed., Disp: , Rfl:  .  cetirizine (ZYRTEC) 10 MG tablet, Take 10 mg by mouth daily as needed for allergies., Disp: , Rfl:  .  Ibuprofen (ADVIL PO), Take 1 tablet by mouth daily., Disp: , Rfl:  .  omeprazole (PRILOSEC) 20 MG capsule, 1 PO BID. 1 POR BOCA DOS VECES AL DIA (Patient taking differently: Take 20 mg by mouth daily. 1 PO BID. 1 POR BOCA DOS VECES AL DIA), Disp: 60 capsule, Rfl: 11 .  DULoxetine (CYMBALTA) 30 MG capsule, Take 1 capsule (30 mg total) by mouth daily. Tome una tableta por boca diaria (Patient not taking: Reported on 01/28/2020), Disp: 30 capsule, Rfl: 3 .  Ferrous Sulfate (IRON PO), Take 1 tablet by mouth daily. (Patient not taking: Reported on 01/28/2020), Disp: , Rfl:      Review of Systems  Per HPI unless specifically indicated above     Objective:    BP 130/80   Pulse 88   Temp 98.1  F (36.7 C)   Ht 5' 0.5" (1.537 m)   Wt 147 lb 8 oz (66.9 kg)   SpO2 98%   BMI 28.33 kg/m   Wt Readings from Last 3 Encounters:  01/28/20 147 lb 8 oz (66.9 kg)  12/24/19 149 lb 6.4 oz (67.8 kg)  11/06/19 146 lb 8 oz (66.5 kg)    Physical Exam Vitals reviewed.  Constitutional:      General: She is not in acute distress.    Appearance: She is well-developed. She is not ill-appearing.  HENT:     Head: Normocephalic and atraumatic.  Cardiovascular:     Rate and Rhythm: Normal rate and regular rhythm.  Pulmonary:     Effort: Pulmonary effort is normal.     Breath sounds: Normal breath sounds.  Abdominal:     General: Bowel sounds are normal.     Palpations: Abdomen is soft. There is no mass.     Tenderness: There is no abdominal tenderness.  Musculoskeletal:     Cervical back: Neck supple.     Right lower leg: No edema.     Left lower leg: No edema.  Lymphadenopathy:  Cervical: No cervical adenopathy.  Skin:    General: Skin is warm and dry.  Neurological:     Mental Status: She is alert and oriented to person, place, and time.  Psychiatric:        Behavior: Behavior normal.           Assessment & Plan:    Encounter Diagnoses  Name Primary?  . Urinary frequency Yes  . Non-English speaking patient   . Other fatigue   . Menopausal symptoms       -pt to Continue cymbalta  -pt will be referred for Screening mammogram in December  -pt to follow up 3 months.  will update pap at that appointment.  Pt to contact office sooner prn

## 2020-01-28 NOTE — Patient Instructions (Signed)
Polaquiuria en los adultos Urinary Frequency, Adult El trmino polaquiuria describe la necesidad de Garment/textile technologist con mayor frecuencia que lo normal. Usted puede orinar cada 1 o 2 horas, incluso aunque beba una cantidad normal de lquidos y no tenga una infeccin o un problema en la vejiga. Si bien orina con mayor frecuencia de lo habitual, la cantidad de [place] total producida en un da es normal. Con la polaquiuria, usted puede sentir la necesidad urgente de Garment/textile technologist con frecuencia. El estrs y la ansiedad de Best boy que hallar un bao con rapidez pueden hacer que esta urgencia empeore. Esta afeccin podra desaparecer sola o usted podra necesitar tratamiento en el hogar. El tratamiento en el hogar puede incluir entrenar la vejiga, Field seismologist ejercicios, tomar medicamentos o hacer cambios en su dieta. Siga estas indicaciones en su casa: Salud de la vejiga   Lleve un diario de sus micciones si se lo indic el mdico. Lleve un registro de lo siguiente: ? Lo que usted come y bebe. ? La frecuencia con la que orina. ? La cantidad que [place].  Siga un programa de entrenamiento de la vejiga si se lo indic el mdico. Puede incluir: ? Aprender a Actor de ir al bao. ? Doble miccin (vaciado). Esto ayuda si usted no vaca totalmente la vejiga. ? Miccin pautada.  Haga ejercicios de Kegel como se lo haya indicado el mdico. Los ejercicios de Kegel fortalecen los msculos que contribuyen a Secretary/administrator, lo que puede ayudar a Personnel officer. Comida y bebida  Si se lo indic el mdico, haga cambios en su dieta, tales como: ? Media planner. ? Beber menos lquidos, especialmente alcohol. ? No beber por la noche. ? Evitar alimentos y bebidas que pueden irritar la vejiga. Estos incluyen el caf, el t, los refrescos, los edulcorantes artificiales, los ctricos, los alimentos a base de tomate y el chocolate. ? Comer alimentos que ayudan a prevenir o Cytogeneticist. El estreimiento puede  empeorar esta afeccin. El mdico podra recomendarle que haga lo siguiente:  Beber suficiente lquido como para Theatre manager la orina de color amarillo plido.  Tomar medicamentos recetados o de [employer].  Consumir alimentos ricos en fibra, como frijoles, cereales integrales, y frutas y verduras frescas.  Limitar su consumo de alimentos ricos en grasa y azcares procesados, como los alimentos fritos o dulces. Indicaciones generales  [employer] de venta libre y los recetados solamente como se lo haya indicado el mdico.  Consulting civil engineer a todas las visitas de control como se lo haya indicado el mdico. Esto es importante. Comunquese con un mdico si:  Comienza a orinar con ms frecuencia.  Siente dolor o tiene irritacin al [employer].  Observa sangre en la orina.  La orina parece turbia.  Presenta fiebre.  Comienza a vomitar. Solicite ayuda inmediatamente si:  No puede orinar. Resumen  El trmino polaquiuria describe la necesidad de Garment/textile technologist con mayor frecuencia que lo normal. Con polaquiuria, usted puede orinar cada 1 o 2 horas, incluso aunque beba una cantidad normal de lquidos y no tenga una infeccin u otro problema en la vejiga.  El mdico puede recomendarle que lleve un diario de micciones, siga un programa de entrenamiento de la vejiga o haga cambios en su dieta.  Si se lo indica el mdico, practique los ejercicios de Kegel para fortalecer los msculos que contribuyen a Secretary/administrator.  Tome los medicamentos de venta libre y los recetados solamente como se lo haya indicado el mdico.  Comunquese con un  mdico si los sntomas no mejoran o si empeoran. Esta informacin no tiene Marine scientist el consejo del mdico. Asegrese de hacerle al mdico cualquier pregunta que tenga. Document Revised: 11/15/2017 Document Reviewed: 11/15/2017 Elsevier Patient Education  2020 [employer].

## 2020-02-12 ENCOUNTER — Other Ambulatory Visit: Payer: Self-pay | Admitting: Student

## 2020-02-12 DIAGNOSIS — Z1231 Encounter for screening mammogram for malignant neoplasm of breast: Secondary | ICD-10-CM

## 2020-03-23 ENCOUNTER — Other Ambulatory Visit: Payer: Self-pay

## 2020-03-23 ENCOUNTER — Ambulatory Visit (HOSPITAL_COMMUNITY)
Admission: RE | Admit: 2020-03-23 | Discharge: 2020-03-23 | Disposition: A | Discharge status: Home / Self Care | Payer: Self-pay | Source: Ambulatory Visit | Attending: Physician Assistant | Admitting: Physician Assistant

## 2020-03-23 DIAGNOSIS — Z1231 Encounter for screening mammogram for malignant neoplasm of breast: Secondary | ICD-10-CM | POA: Insufficient documentation

## 2020-03-25 ENCOUNTER — Telehealth: Payer: Self-pay | Admitting: Student

## 2020-03-25 ENCOUNTER — Emergency Department (HOSPITAL_COMMUNITY)
Admission: EM | Admit: 2020-03-25 | Discharge: 2020-03-25 | Disposition: A | Discharge status: Home / Self Care | Payer: Self-pay | Attending: Emergency Medicine | Admitting: Emergency Medicine

## 2020-03-25 ENCOUNTER — Encounter (HOSPITAL_COMMUNITY): Payer: Self-pay | Admitting: *Deleted

## 2020-03-25 ENCOUNTER — Ambulatory Visit: Payer: Self-pay | Admitting: *Deleted

## 2020-03-25 ENCOUNTER — Other Ambulatory Visit: Payer: Self-pay

## 2020-03-25 DIAGNOSIS — N3001 Acute cystitis with hematuria: Secondary | ICD-10-CM | POA: Insufficient documentation

## 2020-03-25 DIAGNOSIS — Z79899 Other long term (current) drug therapy: Secondary | ICD-10-CM | POA: Insufficient documentation

## 2020-03-25 LAB — URINALYSIS, ROUTINE W REFLEX MICROSCOPIC
Bilirubin Urine: NEGATIVE
Glucose, UA: NEGATIVE mg/dL
Ketones, ur: NEGATIVE mg/dL
Nitrite: NEGATIVE
Protein, ur: 100 mg/dL — AB
RBC / HPF: >50 RBC/hpf — ABNORMAL HIGH (ref 0–5)
Specific Gravity, Urine: 1.026 (ref 1.005–1.030)
WBC, UA: >50 WBC/hpf — ABNORMAL HIGH (ref 0–5)
pH: 6 (ref 5.0–8.0)

## 2020-03-25 LAB — CBC WITH DIFFERENTIAL/PLATELET
Abs Immature Granulocytes: 0.02 10*3/uL (ref 0.00–0.07)
Basophils Absolute: 0 10*3/uL (ref 0.0–0.1)
Basophils Relative: 0 %
Eosinophils Absolute: 0.2 10*3/uL (ref 0.0–0.5)
Eosinophils Relative: 2 %
HCT: 38.1 % (ref 36.0–46.0)
Hemoglobin: 11.5 g/dL — ABNORMAL LOW (ref 12.0–15.0)
Immature Granulocytes: 0 %
Lymphocytes Relative: 21 %
Lymphs Abs: 1.9 10*3/uL (ref 0.7–4.0)
MCH: 23.7 pg — ABNORMAL LOW (ref 26.0–34.0)
MCHC: 30.2 g/dL (ref 30.0–36.0)
MCV: 78.6 fL — ABNORMAL LOW (ref 80.0–100.0)
Monocytes Absolute: 0.6 10*3/uL (ref 0.1–1.0)
Monocytes Relative: 6 %
Neutro Abs: 6.4 10*3/uL (ref 1.7–7.7)
Neutrophils Relative %: 71 %
Platelets: 264 10*3/uL (ref 150–400)
RBC: 4.85 MIL/uL (ref 3.87–5.11)
RDW: 16.3 % — ABNORMAL HIGH (ref 11.5–15.5)
WBC: 9.1 10*3/uL (ref 4.0–10.5)
nRBC: 0 % (ref 0.0–0.2)

## 2020-03-25 LAB — BASIC METABOLIC PANEL
Anion gap: 10 (ref 5–15)
BUN: 16 mg/dL (ref 6–20)
CO2: 24 mmol/L (ref 22–32)
Calcium: 8.7 mg/dL — ABNORMAL LOW (ref 8.9–10.3)
Chloride: 103 mmol/L (ref 98–111)
Creatinine, Ser: 0.66 mg/dL (ref 0.44–1.00)
GFR, Estimated: >60 mL/min (ref >60)
Glucose, Bld: 92 mg/dL (ref 70–99)
Potassium: 4.1 mmol/L (ref 3.5–5.1)
Sodium: 137 mmol/L (ref 135–145)

## 2020-03-25 MED ORDER — CEPHALEXIN 500 MG PO CAPS
500.0000 mg | ORAL_CAPSULE | Freq: Once | ORAL | Status: AC
  Administered 2020-03-25: 500 mg via ORAL
  Filled 2020-03-25: qty 1

## 2020-03-25 MED ORDER — PHENAZOPYRIDINE HCL 200 MG PO TABS: 200.0000 mg | ORAL_TABLET | Freq: Three times a day (TID) | ORAL | 0 refills | Status: DC

## 2020-03-25 MED ORDER — PHENAZOPYRIDINE HCL 100 MG PO TABS
200.0000 mg | ORAL_TABLET | Freq: Once | ORAL | Status: AC
  Administered 2020-03-25: 200 mg via ORAL
  Filled 2020-03-25: qty 2

## 2020-03-25 MED ORDER — CEPHALEXIN 500 MG PO CAPS: 500.0000 mg | ORAL_CAPSULE | Freq: Two times a day (BID) | ORAL | 0 refills | Status: DC

## 2020-03-25 NOTE — ED Triage Notes (Signed)
Pt with blood in urine for past 3 days, pain with urination today. Increase in frequency but smaller amount of urine.  + chills.

## 2020-03-25 NOTE — Telephone Encounter (Signed)
Patient's daughter called to assist with translating from Spanish to [place]. Patient's c/o increased frequent urination and blood noted when wiping. Some pink color noted in urine. Frequency started 3 days ago with urge to urinate and very little urine outpput. Chills noted when urinating. Patient has been incontinent which is new. Feels like she is not emptying bladder. Urine cloudy yellow. Some dizziness and weakness reported. LMP 03/03/20 for 2 weeks. No available appt at her PCP. Instructed patient and daughter to have patient go to [facility] or ED now. Care advise given. Patient's daughter verbalized understanding of care advise and to go to [facility] or ED.   Reason for Disposition . [1] Unable to urinate (or only a few drops) > 4 hours AND [2] bladder feels very full (e.g., palpable bladder or strong urge to urinate)  Answer Assessment - Initial Assessment Questions 1. COLOR of URINE: "Describe the color of the urine."  (e.g., tea-colored, pink, red, blood clots, bloody)     Very yellow yesterday and now noted blood when wiping  2. ONSET: "When did the bleeding start?"      11am this morning when wiping  3. EPISODES: "How many times has there been blood in the urine?" or "How many times today?"     Na  4. PAIN with URINATION: "Is there any pain with passing your urine?" If Yes, ask: "How bad is the pain?"  (Scale 1-10; or mild, moderate, severe)    - MILD - complains slightly about urination hurting    - MODERATE - interferes with normal activities      - SEVERE - excruciating, unwilling or unable to urinate because of the pain      Mild, more frequent urination  5. FEVER: "Do you have a fever?" If Yes, ask: "What is your temperature, how was it measured, and when did it start?"     no 6. ASSOCIATED SYMPTOMS: "Are you passing urine more frequently than usual?"     Frequent urination and only drops noted, patient has episodes of incontinence.  7. OTHER SYMPTOMS: "Do you have any other symptoms?" (e.g.,  back/flank pain, abdominal pain, vomiting)     Low back pain  8. PREGNANCY: "Is there any chance you are pregnant?" "When was your last menstrual period?"     LMP 03/03/20 x 2 weeks  Protocols used: URINE - BLOOD IN-A-AH

## 2020-03-25 NOTE — Telephone Encounter (Signed)
Pt called c/o dysuria and blood in urine that started today. Pt was informed that PA will not be in the office tomorrow and will return on Monday, 03-31-19. Pt would like to schedule an appt for Monday. Pt was scheduled for an appt on Monday, 03-31-19 but was urged to seek treatment at an Urgent Care before then. Pt verbalized understanding.

## 2020-03-25 NOTE — Discharge Instructions (Signed)
Take your next dose of the antibiotics and the pain medicine tomorrow morning.  Make sure you are drinking plenty of fluids.  Remember that the pain medicine will turn your urine bright orange and this is normal.
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
## 2020-03-27 LAB — URINE CULTURE: Culture: <10000 — AB

## 2020-03-28 HISTORY — PX: BREAST EXCISIONAL BIOPSY: SUR124

## 2020-03-28 HISTORY — PX: BREAST BIOPSY: SHX20

## 2020-03-30 ENCOUNTER — Ambulatory Visit: Payer: Self-pay | Admitting: Physician Assistant

## 2020-04-28 ENCOUNTER — Ambulatory Visit: Payer: Self-pay | Admitting: Physician Assistant

## 2020-04-28 ENCOUNTER — Other Ambulatory Visit: Payer: Self-pay

## 2020-04-28 ENCOUNTER — Encounter: Payer: Self-pay | Admitting: Physician Assistant

## 2020-04-28 ENCOUNTER — Other Ambulatory Visit (HOSPITAL_COMMUNITY)
Admission: RE | Admit: 2020-04-28 | Discharge: 2020-04-28 | Disposition: A | Discharge status: Home / Self Care | Payer: Self-pay | Source: Ambulatory Visit | Attending: Physician Assistant | Admitting: Physician Assistant

## 2020-04-28 VITALS — BP 146/90 | HR 98 | Temp 98.0°F | Ht 60.5 in | Wt 150.0 lb

## 2020-04-28 DIAGNOSIS — Z124 Encounter for screening for malignant neoplasm of cervix: Secondary | ICD-10-CM

## 2020-04-28 DIAGNOSIS — N6452 Nipple discharge: Secondary | ICD-10-CM

## 2020-04-28 DIAGNOSIS — R350 Frequency of micturition: Secondary | ICD-10-CM

## 2020-04-28 DIAGNOSIS — R1013 Epigastric pain: Secondary | ICD-10-CM

## 2020-04-28 DIAGNOSIS — Z789 Other specified health status: Secondary | ICD-10-CM

## 2020-04-28 DIAGNOSIS — R300 Dysuria: Secondary | ICD-10-CM

## 2020-04-28 DIAGNOSIS — N644 Mastodynia: Secondary | ICD-10-CM

## 2020-04-28 DIAGNOSIS — R1084 Generalized abdominal pain: Secondary | ICD-10-CM

## 2020-04-28 DIAGNOSIS — R030 Elevated blood-pressure reading, without diagnosis of hypertension: Secondary | ICD-10-CM

## 2020-04-28 DIAGNOSIS — N6459 Other signs and symptoms in breast: Secondary | ICD-10-CM

## 2020-04-28 MED ORDER — OMEPRAZOLE 20 MG PO CPDR: DELAYED_RELEASE_CAPSULE | ORAL | 11 refills | Status: DC

## 2020-04-28 NOTE — Progress Notes (Signed)
BP (!) 146/90   Pulse 98   Temp 98 F (36.7 C)   Ht 5' 0.5" (1.537 m)   Wt 150 lb (68 kg)   LMP 02/02/2020 (Approximate)   SpO2 98%   BMI 28.81 kg/m    Subjective:    Patient ID: Joyce Roberts, female    DOB: 1970-10-20, 50 y.o.   MRN: [id number]  HPI: Joyce Roberts is a 50 y.o. female presenting on 04/28/2020 for Follow-up, Gynecologic Exam, and Breast Discharge (Pt states she noticed clear, sticky, discharge  from her L nipple about 3 weeks ago. Pt states her discharge on Saturday was brown, pt cleaned it with a q-tip and realized it was brown. Last night when changing bras she noticed blood spot on her bra. Pt reports pain on L breast and L under arm)   HPI   Pt had negative covid 19 screening questionnaire.  Chief Complaint  Patient presents with  . Follow-up  . Gynecologic Exam  . Breast Discharge    Pt states she noticed clear, sticky, discharge  from her L nipple about 3 weeks ago. Pt states her discharge on Saturday was brown, pt cleaned it with a q-tip and realized it was brown. Last night when changing bras she noticed blood spot on her bra. Pt reports pain on L breast and L under arm    Pt is 49yoF with routine follow up to include updating PAP today.  She has multiple complaints.    -pt c/o fatigue -she says she is still having Hot flashes -she says she is still having Urinary issues  she says her abdomen is feeling "a little inflamed".   She feels full after eating only a small amount.  She is only taking the omeprazole qd.   Her BMs are regular.   Her stomach bothers her every day.  it is not hurting right now but she feels "like it needs more air", like she she has over-eaten, so it is bothering her.      Pt was sent to GI in the past.   She was last seen there in 11/22/2017.   She was supposed to follow up but she never went.   She has been seeing GI since at least 2015.     She says her urine is not any better.    She took the antibiotics she was  given.  Urine culture done 03/25/20 was not indicative of infection.    Pt reports problems with urnination at [facility] 01/28/20, 12/24/19, 11/06/19, and in ER on 03/25/20.   She received several courses of antibiotics during this time without resolution of her symptoms.     She is again having nipple discharge on the left.  She has reported this in the pastand has complained of breast pain and breast mass.  She just had diagnostic mammogram on the left in september.  She had routine screening mammogram in December.   She has had multiple diagnositc mammograms over the years and had a biopsy in 2019.    Pt does not feel she has depression.  She doesn't feel like she is having any hypochondriasis.  She doesn't feel like she worries too much about her health issue.     Pt was given cymbalta to help with her hot flashes and follow up had shown taht she was getting some relief.  She stopped the cymbalta due to she thought it was causing HA  .  She is now having the  hot flashes again.  Pt complaints of fatigue are not new.  She has been seen many times for this.      Relevant past medical, surgical, family and social history reviewed and updated as indicated. Interim medical history since our last visit reviewed. Allergies and medications reviewed and updated.    Current Outpatient Medications:  .  acetaminophen (TYLENOL) 500 MG tablet, Take 1,000 mg by mouth 2 (two) times daily as needed., Disp: , Rfl:  .  cetirizine (ZYRTEC) 10 MG tablet, Take 10 mg by mouth daily as needed for allergies., Disp: , Rfl:  .  Ibuprofen (ADVIL PO), Take 1 tablet by mouth daily., Disp: , Rfl:  .  omeprazole (PRILOSEC) 20 MG capsule, 1 PO BID. 1 POR BOCA DOS VECES AL DIA (Patient taking differently: Take 20 mg by mouth daily. 1 PO BID. 1 POR BOCA DOS VECES AL DIA), Disp: 60 capsule, Rfl: 11 .  DULoxetine (CYMBALTA) 30 MG capsule, Take 1 capsule (30 mg total) by mouth daily. Tome una tableta por boca diaria (Patient not taking:  No sig reported), Disp: 30 capsule, Rfl: 3 .  Ferrous Sulfate (IRON PO), Take 1 tablet by mouth daily. (Patient not taking: Reported on 04/28/2020), Disp: , Rfl:      Review of Systems  Per HPI unless specifically indicated above     Objective:    BP (!) 146/90   Pulse 98   Temp 98 F (36.7 C)   Ht 5' 0.5" (1.537 m)   Wt 150 lb (68 kg)   LMP 02/02/2020 (Approximate)   SpO2 98%   BMI 28.81 kg/m   Wt Readings from Last 3 Encounters:  04/28/20 150 lb (68 kg)  03/25/20 148 lb 12.8 oz (67.5 kg)  01/28/20 147 lb 8 oz (66.9 kg)    Physical Exam Vitals and nursing note reviewed. Exam conducted with a chaperone present.  Constitutional:      General: She is not in acute distress.    Appearance: She is well-developed and well-nourished. She is not ill-appearing.  HENT:     Head: Normocephalic and atraumatic.  Cardiovascular:     Rate and Rhythm: Normal rate and regular rhythm.  Pulmonary:     Effort: Pulmonary effort is normal. No respiratory distress.     Breath sounds: Normal breath sounds. No wheezing or rhonchi.     Comments: Breast exam normal Chest:  Breasts: No discharge from either breast. No tenderness and bleeding.     Right: No bleeding, mass, nipple discharge, axillary adenopathy or supraclavicular adenopathy.     Left: No bleeding, mass, nipple discharge, axillary adenopathy or supraclavicular adenopathy.    Abdominal:     Palpations: Abdomen is soft. There is no mass.     Tenderness: There is no abdominal tenderness. There is no right CVA tenderness, left CVA tenderness, guarding or rebound.  Genitourinary:    Labia:        Right: No rash, tenderness or lesion.        Left: No rash, tenderness or lesion.      Vagina: Normal.     Cervix: No cervical motion tenderness, discharge or friability.     Uterus: Normal.      Adnexa:        Right: No mass, tenderness or fullness.         Left: No mass, tenderness or fullness.       Comments: Curator  assisted) Musculoskeletal:     Right lower leg: No  edema.     Left lower leg: No edema.  Lymphadenopathy:     Upper Body:     Right upper body: No supraclavicular or axillary adenopathy.     Left upper body: No supraclavicular or axillary adenopathy.  Skin:    General: Skin is warm and dry.  Neurological:     Mental Status: She is alert and oriented to person, place, and time.  Psychiatric:        Attention and Perception: Attention normal.        Mood and Affect: Mood and affect normal.        Speech: Speech normal.                Assessment & Plan:     Encounter Diagnoses  Name Primary?  . Routine Papanicolaou smear Yes  . Elevated blood pressure reading   . Breast complaint   . Non-English speaking patient   . Generalized abdominal pain   . Nipple discharge   . Breast pain   . Dyspepsia   . Urinary frequency   . Dysuria        -will Send her back to GI for abdominal pain, to urologist for urine and order another diagnostic mammogram -pt is counseled to Increase omeprazole to bid -she is given application for cafa/ [employer] financial assistance -no history of htn so will see pt back in office in 6-8 weeks to recheck the bp.  She is to contact office sooner prn

## 2020-05-01 LAB — CYTOLOGY - PAP
Comment: NEGATIVE
Diagnosis: NEGATIVE
High risk HPV: NEGATIVE

## 2020-05-05 ENCOUNTER — Encounter: Payer: Self-pay | Admitting: Internal Medicine

## 2020-05-06 ENCOUNTER — Other Ambulatory Visit (HOSPITAL_COMMUNITY): Payer: Self-pay | Admitting: Physician Assistant

## 2020-05-06 DIAGNOSIS — N6452 Nipple discharge: Secondary | ICD-10-CM

## 2020-05-12 ENCOUNTER — Ambulatory Visit (HOSPITAL_COMMUNITY)
Admission: RE | Admit: 2020-05-12 | Discharge: 2020-05-12 | Disposition: A | Discharge status: Home / Self Care | Payer: Self-pay | Source: Ambulatory Visit | Attending: Physician Assistant | Admitting: Physician Assistant

## 2020-05-12 ENCOUNTER — Other Ambulatory Visit: Payer: Self-pay

## 2020-05-12 DIAGNOSIS — N6459 Other signs and symptoms in breast: Secondary | ICD-10-CM | POA: Insufficient documentation

## 2020-05-12 DIAGNOSIS — N6452 Nipple discharge: Secondary | ICD-10-CM

## 2020-05-12 DIAGNOSIS — N644 Mastodynia: Secondary | ICD-10-CM | POA: Insufficient documentation

## 2020-05-14 ENCOUNTER — Other Ambulatory Visit: Payer: Self-pay | Admitting: Physician Assistant

## 2020-05-14 DIAGNOSIS — R928 Other abnormal and inconclusive findings on diagnostic imaging of breast: Secondary | ICD-10-CM

## 2020-05-14 DIAGNOSIS — N6320 Unspecified lump in the left breast, unspecified quadrant: Secondary | ICD-10-CM

## 2020-05-26 ENCOUNTER — Other Ambulatory Visit (HOSPITAL_COMMUNITY): Payer: Self-pay | Admitting: *Deleted

## 2020-05-26 DIAGNOSIS — R928 Other abnormal and inconclusive findings on diagnostic imaging of breast: Secondary | ICD-10-CM

## 2020-05-29 ENCOUNTER — Encounter (HOSPITAL_COMMUNITY): Payer: Self-pay

## 2020-05-29 ENCOUNTER — Other Ambulatory Visit: Payer: Self-pay

## 2020-05-29 ENCOUNTER — Ambulatory Visit (HOSPITAL_COMMUNITY)
Admission: RE | Admit: 2020-05-29 | Discharge: 2020-05-29 | Disposition: A | Discharge status: Home / Self Care | Payer: PRIVATE HEALTH INSURANCE | Source: Ambulatory Visit | Attending: *Deleted | Admitting: *Deleted

## 2020-05-29 ENCOUNTER — Other Ambulatory Visit (HOSPITAL_COMMUNITY): Payer: Self-pay | Admitting: *Deleted

## 2020-05-29 DIAGNOSIS — R928 Other abnormal and inconclusive findings on diagnostic imaging of breast: Secondary | ICD-10-CM

## 2020-05-29 MED ORDER — LIDOCAINE-EPINEPHRINE (PF) 1 %-1:200000 IJ SOLN
10.0000 mL | Freq: Once | INTRAMUSCULAR | Status: AC
  Administered 2020-05-29: 10 mL via INTRADERMAL

## 2020-05-29 MED ORDER — LIDOCAINE HCL (PF) 2 % IJ SOLN
INTRAMUSCULAR | Status: AC
  Administered 2020-05-29: 10 mL
  Filled 2020-05-29: qty 10

## 2020-05-29 NOTE — Sedation Documentation (Signed)
PT tolerated left breast biopsy well today with NAD noted. PT verbalized understanding of discharge instructions. PT ambulated back to the mammogram area this time.  

## 2020-05-29 NOTE — Discharge Instructions (Signed)
Breast Biopsy, Care After °These instructions give you information about caring for yourself after your procedure. Your doctor may also give you more specific instructions. Call your doctor if you have any problems or questions after your procedure. °What can I expect after the procedure? °After your procedure, it is common to have: °· Bruising on your breast. °· Numbness, tingling, or pain near your biopsy site. °Follow these instructions at home: °Medicines °· Take over-the-counter and prescription medicines only as told by your doctor. °· Do not drive for 24 hours if you were given a medicine to help you relax (sedative) during your procedure. °· Do not drink alcohol while taking pain medicine. °· Do not drive or use heavy machinery while taking prescription pain medicine. °Biopsy site care °· Follow instructions from your doctor about how to take care of your cut from surgery (incision) or your puncture area. Make sure you: °? Wash your hands with soap and water before you change your bandage (dressing). If you cannot use soap and water, use hand sanitizer. °? Change your bandage as told by your doctor. °? Leave stitches (sutures), skin glue, or skin tape (adhesive strips) in place. They may need to stay in place for 2 weeks or longer. If tape strips get loose and curl up, you may trim the loose edges. Do not remove tape strips completely unless your doctor says it is okay. °· If you have stitches, keep them dry when you take a bath or a shower. °· Check your cut or puncture area every day for signs of infection. Check for: °? Redness, swelling, or pain. °? Fluid or blood. °? Warmth. °? Pus or a bad smell. °· Protect the biopsy area. Do not let the area get bumped.  °  °  °Activity °· If you had a cut during your procedure, avoid activities that could pull your cut open. These include: °? Stretching. °? Reaching over your head. °? Exercise. °? Sports. °? Lifting anything that weighs more than 3 lb (1.4  kg). °· Return to your normal activities as told by your doctor. Ask your doctor what activities are safe for you. °Managing pain, stiffness, and swelling °If told, put ice on the biopsy site to relieve swelling: °· Put ice in a plastic bag. °· Place a towel between your skin and the bag. °· Leave the ice on for 20 minutes, 2-3 times a day. °General instructions °· Continue your normal diet. °· Wear a good support bra for as long as told by your doctor. °· Get checked for extra fluid around your lymph nodes (lymphedema) as often as told by your doctor. °· Keep all follow-up visits as told by your doctor. This is important. °Contact a doctor if: °· You notice any of the following at the biopsy site: °? More redness, swelling, or pain. °? More fluid or blood coming from the site. °? The site feels warm to the touch. °? Pus or a bad smell coming from the site. °? The site breaks open after the stitches or skin tape strips have been removed. °· You have a rash. °· You have a fever. °Get help right away if: °· You have more bleeding from the biopsy site. Get help right away if bleeding is more than a small spot. °· You have trouble breathing. °· You have red streaks around the biopsy site. °Summary °· After your procedure, it is common to have bruising, numbness, tingling, or pain near the biopsy site. °· Do not   drive or use heavy machinery while taking prescription pain medicine. °· Wear a good support bra for as long as told by your doctor. °· If you had a cut during your procedure, avoid activities that may pull the cut open. Ask your doctor what activities are safe for you. °This information is not intended to replace advice given to you by your health care provider. Make sure you discuss any questions you have with your health care provider. °Document Revised: 11/25/2019 Document Reviewed: 08/31/2017 °Elsevier Patient Education © 2021 Elsevier Inc. ° °

## 2020-06-01 LAB — SURGICAL PATHOLOGY

## 2020-06-03 ENCOUNTER — Other Ambulatory Visit: Payer: Self-pay | Admitting: Physician Assistant

## 2020-06-03 DIAGNOSIS — D369 Benign neoplasm, unspecified site: Secondary | ICD-10-CM

## 2020-06-10 ENCOUNTER — Other Ambulatory Visit: Payer: Self-pay | Admitting: Family Medicine

## 2020-06-10 ENCOUNTER — Ambulatory Visit (INDEPENDENT_AMBULATORY_CARE_PROVIDER_SITE_OTHER): Payer: Self-pay | Admitting: Internal Medicine

## 2020-06-10 ENCOUNTER — Other Ambulatory Visit: Payer: Self-pay

## 2020-06-10 ENCOUNTER — Encounter: Payer: Self-pay | Admitting: Internal Medicine

## 2020-06-10 VITALS — BP 134/89 | HR 80 | Temp 97.3°F | Ht 60.0 in | Wt 151.2 lb

## 2020-06-10 DIAGNOSIS — K581 Irritable bowel syndrome with constipation: Secondary | ICD-10-CM

## 2020-06-10 DIAGNOSIS — B9681 Helicobacter pylori [H. pylori] as the cause of diseases classified elsewhere: Secondary | ICD-10-CM

## 2020-06-10 DIAGNOSIS — R112 Nausea with vomiting, unspecified: Secondary | ICD-10-CM

## 2020-06-10 DIAGNOSIS — D369 Benign neoplasm, unspecified site: Secondary | ICD-10-CM

## 2020-06-10 DIAGNOSIS — K2970 Gastritis, unspecified, without bleeding: Secondary | ICD-10-CM
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
## 2020-06-10 NOTE — Patient Instructions (Signed)
Para su dolor abdominal, quiero que sostenga el omeprazole durante 14 das. En ese momento tendr que someterse a una prueba de aliento para H. pylori.  Para su estreimiento, quiero que empiece a tomar MiraLAX (Polyethylene glycol) de venta libre 1 capful diariamente. Si esto no es adecuado quiero que tome 2 capfuls diariamente.  Seguimiento en 6 semanas. Es posible que tengamos que considerar los procedimientos en el futuro.

## 2020-06-19 ENCOUNTER — Other Ambulatory Visit: Payer: Self-pay

## 2020-06-19 ENCOUNTER — Ambulatory Visit (INDEPENDENT_AMBULATORY_CARE_PROVIDER_SITE_OTHER): Payer: Self-pay | Admitting: Urology

## 2020-06-19 ENCOUNTER — Encounter: Payer: Self-pay | Admitting: Urology

## 2020-06-19 VITALS — BP 146/87 | HR 80 | Temp 98.6°F | Resp 16 | Ht 60.0 in | Wt 151.0 lb

## 2020-06-19 DIAGNOSIS — R350 Frequency of micturition: Secondary | ICD-10-CM

## 2020-06-19 DIAGNOSIS — R310 Gross hematuria: Secondary | ICD-10-CM | POA: Diagnosis not present

## 2020-06-19 LAB — URINALYSIS, ROUTINE W REFLEX MICROSCOPIC
Bilirubin, UA: NEGATIVE
Glucose, UA: NEGATIVE
Ketones, UA: NEGATIVE
Leukocytes,UA: NEGATIVE
Nitrite, UA: NEGATIVE
Protein,UA: NEGATIVE
Specific Gravity, UA: >1.03 — ABNORMAL HIGH (ref 1.005–1.030)
Urobilinogen, Ur: 0.2 mg/dL (ref 0.2–1.0)
pH, UA: 5.5 (ref 5.0–7.5)

## 2020-06-19 LAB — BLADDER SCAN AMB NON-IMAGING: Scan Result: 0

## 2020-06-19 MED ORDER — MIRABEGRON ER 25 MG PO TB24: 25.0000 mg | ORAL_TABLET | Freq: Every day | ORAL | 0 refills | Status: DC

## 2020-06-19 NOTE — Patient Instructions (Signed)
Hematuria en adultos Hematuria, Adult La hematuria es la presencia de sangre en la orina. La sangre puede ser visible en la orina o se puede identificar con un anlisis. La causa de esta afeccin puede ser infecciones de la vejiga, la uretra, el rin o la prstata. Otras causas posibles incluyen lo siguiente:  Clculos renales.  Cncer de las vas urinarias.  Exceso de calcio en la orina.  Enfermedades que se transmiten de padres a hijos (enfermedades hereditarias).  Ejercicios que Dealer. Normalmente, las infecciones pueden tratarse con medicamentos, y los clculos renales, por lo general, se eliminarn a travs de la orina. Si ninguna de estas es la causa de la hematuria, quizs sea Chartered loss adjuster ms estudios para identificar la causa de los sntomas que presenta. Es muy importante que le informe a su mdico si ve sangre en la orina, incluso si no tiene dolor o si el sangrado desaparece sin tratamiento. La sangre en la orina, que desaparece y vuelve a Arts administrator, puede ser un sntoma de una enfermedad muy grave, incluido Science writer. No se manifiesta dolor en las etapas iniciales de muchos tipos de cncer urinarios. Siga estas instrucciones en su casa: Medicamentos  Use los medicamentos de venta libre y los recetados solamente como se lo haya indicado el mdico.  Si le recetaron un antibitico, tmelo como se lo haya indicado el mdico. No deje de tomar el antibitico aunque comience a sentirse mejor. Comida y bebida  Beba suficiente lquido como para Theatre manager la orina de color amarillo plido. Se recomienda que beba de 3a4cuartos de galn (de 2.8a3.8l) por da. Si le han diagnosticado una infeccin, se recomienda beber jugo de Ono, adems de grandes cantidades de agua.  Evite la cafena, el t y las bebidas gaseosas. Estas sustancias tienden a Medical illustrator.  Evite el alcohol, ya que Advertising account planner prstata (en los hombres). Indicaciones  generales  Si le han diagnosticado clculos renales, siga las instrucciones de su mdico con respecto a filtrar la orina para rescatar el clculo.  Vace la vejiga con frecuencia. Evite retener la orina durante largos perodos.  Si es mujer: ? Despus de una deposicin, higiencese desde adelante hacia atrs y use cada trozo de papel higinico por nica vez. ? Vace la vejiga antes y despus de Clinical biochemist.  Est atento a cualquier cambio en los sntomas. Informe al mdico sobre cualquier cambio o sntoma nuevo.  Recoger los [employer] de las pruebas es su responsabilidad. Consulte al mdico o pregunte en el departamento donde se realiza la prueba cundo estarn [employer].  Cumpla con todas las visitas de seguimiento. Esto es importante. Comunquese con un mdico si:  Siente dolor en la espalda.  Tiene fiebre o escalofros.  Tiene nuseas o vmitos.  Los sntomas no mejoran despus de 3das.  Los sntomas empeoran. Solicite ayuda de inmediato si:  Presenta muchos vmitos y no puede tomar los medicamentos sin vomitar.  Siente dolor intenso en la espalda o el abdomen, a pesar de [employer].  Tiene una gran cantidad de sangre en la orina.  Despide cogulos de [employer].  Se siente muy dbil o como si fuera a desmayarse.  Se desmaya. Resumen  La hematuria es la presencia de sangre en la orina. Puede tener muchas causas posibles.  Es muy importante que le informe a su mdico si ve sangre en la orina, incluso si no tiene dolor o si el sangrado desaparece sin tratamiento.  Use  los medicamentos de venta libre y los recetados solamente como se lo haya indicado el mdico.  Beba suficiente lquido como para Theatre manager la orina de color amarillo plido. Esta informacin no tiene Marine scientist el consejo del mdico. Asegrese de hacerle al mdico cualquier pregunta que tenga. Document Revised: 01/13/2020 Document  Reviewed: 12/02/2019 Elsevier Patient Education  2021 [employer].
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
## 2020-06-20 LAB — BASIC METABOLIC PANEL
BUN/Creatinine Ratio: 13 (ref 9–23)
BUN: 10 mg/dL (ref 6–24)
CO2: 20 mmol/L (ref 20–29)
Calcium: 8.8 mg/dL (ref 8.7–10.2)
Chloride: 102 mmol/L (ref 96–106)
Creatinine, Ser: 0.75 mg/dL (ref 0.57–1.00)
Glucose: 90 mg/dL (ref 65–99)
Potassium: 4.5 mmol/L (ref 3.5–5.2)
Sodium: 139 mmol/L (ref 134–144)
eGFR: 98 mL/min/{1.73_m2} (ref >59)

## 2020-06-22 ENCOUNTER — Telehealth: Payer: Self-pay

## 2020-06-22 NOTE — Telephone Encounter (Signed)
Pts daughter called asking if I could tell her when a test was scheduled and what to do for it. I looked up CT that was scheduled and gave information for it and number to Radiology for pre instructions.

## 2020-06-23 ENCOUNTER — Encounter: Payer: Self-pay | Admitting: Physician Assistant

## 2020-06-23 ENCOUNTER — Ambulatory Visit: Payer: Self-pay | Admitting: Physician Assistant

## 2020-06-23 ENCOUNTER — Other Ambulatory Visit: Payer: Self-pay

## 2020-06-23 ENCOUNTER — Other Ambulatory Visit (HOSPITAL_COMMUNITY)
Admission: RE | Admit: 2020-06-23 | Discharge: 2020-06-23 | Disposition: A | Discharge status: Home / Self Care | Payer: PRIVATE HEALTH INSURANCE | Source: Ambulatory Visit | Attending: Internal Medicine | Admitting: Internal Medicine

## 2020-06-23 VITALS — BP 142/76 | HR 93 | Temp 97.6°F | Wt 150.0 lb

## 2020-06-23 DIAGNOSIS — F32A Depression, unspecified: Secondary | ICD-10-CM

## 2020-06-23 DIAGNOSIS — Z789 Other specified health status: Secondary | ICD-10-CM

## 2020-06-23 DIAGNOSIS — B9681 Helicobacter pylori [H. pylori] as the cause of diseases classified elsewhere: Secondary | ICD-10-CM | POA: Insufficient documentation

## 2020-06-23 DIAGNOSIS — K2970 Gastritis, unspecified, without bleeding: Secondary | ICD-10-CM | POA: Insufficient documentation

## 2020-06-23 DIAGNOSIS — I10 Essential (primary) hypertension: Secondary | ICD-10-CM

## 2020-06-23 DIAGNOSIS — J302 Other seasonal allergic rhinitis: Secondary | ICD-10-CM

## 2020-06-23 MED ORDER — CETIRIZINE HCL 10 MG PO TABS: 10.0000 mg | ORAL_TABLET | Freq: Every day | ORAL | 0 refills | Status: DC | PRN

## 2020-06-23 MED ORDER — FLUTICASONE PROPIONATE 50 MCG/ACT NA SUSP: 2.0000 | Freq: Every day | NASAL | 0 refills | Status: DC

## 2020-06-23 MED ORDER — LOSARTAN POTASSIUM 25 MG PO TABS: 25.0000 mg | ORAL_TABLET | Freq: Every day | ORAL | 0 refills | Status: DC

## 2020-06-23 NOTE — Progress Notes (Signed)
BP (!) 142/76   Pulse 93   Temp 97.6 F (36.4 C)   Wt 150 lb (68 kg)   SpO2 98%   BMI 29.29 kg/m    Subjective:    Patient ID: Joyce Roberts, female    DOB: August 27, 1970, 50 y.o.   MRN: [id number]  HPI: [name] is a 50 y.o. female presenting on 06/23/2020 for Hypertension   HPI    Pt had a negative covid 19 screening questionnaire.   Chief Complaint  Patient presents with  . Hypertension    Pt is 49yoF who is seeing many specialists for a variety of issues.  She is seeing :  Dealer for urinary frequency and hematuria, Gastroenterologist for IBS and she has appointment with surgery later this week for abnormal mammogram.   Pt says meds from urologist were samples.  When asked if she was having any depression in light of all of her medical issues, she says that sometimes she doesn't want to get out of bed.  She says she sometimes she spends all day in bed, only getting up to eat.  She says she often feels tired.  She has no SI, HI.   Pt complains of having sinus congestion and allergies.  She is using zyrtec but says she is still having symptoms and sometimes pressure.   She says she was recently approved for [facility] but isn't sure exactly how that works.    Relevant past medical, surgical, family and social history reviewed and updated as indicated. Interim medical history since our last visit reviewed. Allergies and medications reviewed and updated.    Current Outpatient Medications:  .  acetaminophen (TYLENOL) 500 MG tablet, Take 1,000 mg by mouth 2 (two) times daily as needed., Disp: , Rfl:  .  cetirizine (ZYRTEC) 10 MG tablet, Take 10 mg by mouth daily as needed for allergies., Disp: , Rfl:  .  Ibuprofen (ADVIL PO), Take 1 tablet by mouth daily., Disp: , Rfl:  .  mirabegron ER (MYRBETRIQ) 25 MG TB24 tablet, Take 1 tablet (25 mg total) by mouth daily., Disp: 30 tablet, Rfl: 0 .  omeprazole (PRILOSEC) 20 MG capsule, 1 PO BID. 1 POR BOCA DOS VECES AL  DIA (Patient not taking: Reported on 06/23/2020), Disp: 60 capsule, Rfl: 11     Review of Systems  Per HPI unless specifically indicated above     Objective:    BP (!) 142/76   Pulse 93   Temp 97.6 F (36.4 C)   Wt 150 lb (68 kg)   SpO2 98%   BMI 29.29 kg/m   Wt Readings from Last 3 Encounters:  06/23/20 150 lb (68 kg)  06/19/20 151 lb (68.5 kg)  06/10/20 151 lb 3.2 oz (68.6 kg)    Physical Exam Vitals reviewed.  Constitutional:      General: She is not in acute distress.    Appearance: She is well-developed. She is not toxic-appearing.  HENT:     Head: Normocephalic and atraumatic.  Cardiovascular:     Rate and Rhythm: Normal rate and regular rhythm.  Pulmonary:     Effort: Pulmonary effort is normal.     Breath sounds: Normal breath sounds.  Abdominal:     General: Bowel sounds are normal.     Palpations: Abdomen is soft. There is no mass.     Tenderness: There is no abdominal tenderness.  Musculoskeletal:     Cervical back: Neck supple.     Right lower leg:  No edema.     Left lower leg: No edema.  Lymphadenopathy:     Cervical: No cervical adenopathy.  Skin:    General: Skin is warm and dry.  Neurological:     Mental Status: She is alert and oriented to person, place, and time.  Psychiatric:        Attention and Perception: Attention normal.        Mood and Affect: Affect is flat. Affect is not tearful or inappropriate.        Speech: Speech normal.        Behavior: Behavior normal. Behavior is cooperative.     Comments: Pt has flat affect which is her baseline.             Assessment & Plan:    Encounter Diagnoses  Name Primary?  . Primary hypertension Yes  . Depression, unspecified depression type   . Seasonal allergies   . Non-English speaking patient      -explained to pt how [facility] works.  Emphasized that they cannot provide every medication.  She is given a list of medications that would be available for her.  She is encouraged  to take the list with her to her specialist appointments to help them know what is available -rx for zyrtec and flonase sent to [facility] -encouraged pt to go to [facility] for counseling.  She is given contact information -will Start low dose losartan for htn -pt to continue with her specialists per their recommendations -pt to follow up here in 3 months.  She is to contact office sooner prn

## 2020-06-25 ENCOUNTER — Other Ambulatory Visit: Payer: Self-pay

## 2020-06-25 ENCOUNTER — Ambulatory Visit (INDEPENDENT_AMBULATORY_CARE_PROVIDER_SITE_OTHER): Payer: PRIVATE HEALTH INSURANCE | Admitting: General Surgery

## 2020-06-25 ENCOUNTER — Other Ambulatory Visit (HOSPITAL_COMMUNITY): Payer: Self-pay | Admitting: General Surgery

## 2020-06-25 ENCOUNTER — Encounter: Payer: Self-pay | Admitting: General Surgery

## 2020-06-25 VITALS — BP 132/91 | HR 87 | Temp 97.3°F | Resp 14 | Ht 60.0 in | Wt 151.0 lb

## 2020-06-25 DIAGNOSIS — D369 Benign neoplasm, unspecified site: Secondary | ICD-10-CM | POA: Diagnosis not present

## 2020-06-25 DIAGNOSIS — N644 Mastodynia: Secondary | ICD-10-CM | POA: Diagnosis not present

## 2020-06-25 DIAGNOSIS — N6459 Other signs and symptoms in breast: Secondary | ICD-10-CM

## 2020-06-25 DIAGNOSIS — R928 Other abnormal and inconclusive findings on diagnostic imaging of breast: Secondary | ICD-10-CM
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
## 2020-06-25 NOTE — Patient Instructions (Signed)
Biopsia de mama Breast Biopsy La biopsia de mama es un procedimiento en el cual se extrae una muestra de tejido mamario y se la examina con microscopio para determinar si hay clulas cancerosas. Puede necesitar realizarse una biopsia de mama si tiene los siguientes signos o sntomas:  Un bulto en la mama no diagnosticado (tumor).  Anormalidades, hundimientos, costras o ulceraciones en el pezn.  Secrecin anormal del pezn, Dealer.  Enrojecimiento, hinchazn y [place].  Depsitos de calcio (calcificaciones) o anormalidades observadas en una mamografa, ecografa o resonancia magntica (RM).  Cambios anormales en la mama que se observan en la mamografa. Si se descubre que la anomala es cancerosa (maligna), una biopsia de mama puede ayudar a Teacher, adult education cul es el mejor tratamiento para usted. Hay diferentes tipos de biopsia de mama. Hable con el mdico acerca de las opciones y cul es la mejor para usted. Informe al mdico acerca de lo siguiente:  Cualquier alergia que tenga.  Todos los [employer], incluidos vitaminas, hierbas, gotas oftlmicas, cremas y medicamentos de venta libre.  Cualquier problema previo que usted o algn miembro de su familia haya tenido con los anestsicos.  Cualquier trastorno de la sangre que tenga.  Cirugas a las que se haya sometido.  Cualquier afeccin mdica que tenga.  Si est embarazada o podra estarlo. Cules son los riesgos? En general, se trata de un procedimiento seguro. Sin embargo, pueden ocurrir complicaciones, por ejemplo:  Molestias. Esto es temporal.  Hematomas e hinchazn de la mama.  Cambios en la forma de la mama.  Sangrado.  Infeccin.  Dao a otros tejidos.  Reacciones alrgicas a los medicamentos.  Necesidad de [place]. Qu ocurre antes del procedimiento? Medicamentos Consulte al mdico sobre:  Quarry manager o suspender los medicamentos que toma habitualmente. Esto es  muy importante si toma medicamentos para la diabetes o anticoagulantes.  Tomar medicamentos como aspirina e ibuprofeno. Estos medicamentos pueden tener un efecto anticoagulante en la [place]. No tome estos medicamentos a menos que el mdico se lo indique.  Tomar medicamentos de [employer], vitaminas, hierbas y suplementos. Estilo de vida  No consuma ningn producto que contenga nicotina o tabaco, como cigarrillos, cigarrillos electrnicos y tabaco de Higher education careers adviser. Si necesita ayuda para dejar de consumir, consulte al mdico.  No beba alcohol durante las 24 horas previas al procedimiento.  Use un buen sostn para el procedimiento. Restricciones en las comidas y bebidas Hable con su mdico acerca de cundo debe dejar de comer y beber.  Pueden indicarle que no coma ni beba nada durante 2 a 8 horas antes de la biopsia de mama.  En algunos casos, pueden permitirle tomar un desayuno liviano. Indicaciones generales  Haga que alguien lo lleve a su casa desde el hospital o la clnica.  Pregntele al mdico cmo se marcar o Museum/gallery curator de la Leisure centre manager.  Pregntele al mdico qu medidas se tomarn para ayudar a prevenir una infeccin. Estas medidas pueden incluir: ? Rasurar el vello del lugar de la ciruga. ? Lavar la piel con un jabn antisptico.  El mdico puede realizar un procedimiento para [place] y Scientist, clinical (histocompatibility and immunogenetics) la zona del tumor en la mama (localizacin). Esto ayudar a Psychologist, sport and exercise al [employer] lugar donde se hace la biopsia o incisin. Esto se Fish farm manager de las siguientes maneras: ? Pruebas de diagnstico por imgenes, como una [place], ecografa o una resonancia magntica (RM). ? La colocacin de un implante especial en forma de alambre, clip, semilla o reflector radar  en la zona del tumor. Qu ocurre durante el procedimiento?  Pueden administrarle uno o ms de los siguientes medicamentos: ? Un medicamento para adormecer la zona de la mama (anestesia local). ? Un medicamento  para ayudar a relajarse (sedante). ? Un medicamento que la har dormir (anestesia general).  El mdico le har la biopsia usando solo uno de los siguientes mtodos. El mdico har lo siguiente: ? Aspiracin con aguja fina: Se insertar una aguja fina con Cl[place] en un quiste de la mama. Se extraern clulas y lquido. ? Biopsia core: Se insertar una aguja de seccin amplia (aguja gruesa) en el bulto de la mama varias veces para extraer muestras de tejido. ? Biopsia estereotctica: Se utilizarn rayos X y [place] computadora para [place] el ndulo Emma. El [place] usar estas imgenes radiogrficas para obtener varias muestras de tejido [place]. ? Biopsia asistida por vaco. Se le realizar una pequea incisin en la mama. Maxwell Caul hueca y un suctor se pasarn a travs de la incisin [employer] interior del tejido [place]. El suctor suavemente drenar tejido mamario anormal hacia la aguja para extraerlo. ? Biopsia con aguja gruesa guiada con ecografa. Se utilizar una ecografa para ayudar a guiar la aguja gruesa a la zona de la masa o anomala. Se har una incisin para insertar la aguja. Luego se extraern muestras de tejido. ? Biopsia quirrgica. Le harn una incisin en la mama para extraer una parte o la totalidad del tejido anormal. Despus de que se extraiga el tejido, la piel de la zona se cerrar con suturas y se cubrir con un vendaje. Existen dos tipos de biopsias quirrgicas:  Biopsia incisional. El cirujano extraer parte del bulto mamario.  Biopsia escisional. El cirujano intentar extirpar todo el bulto de la mama o todo lo que pueda. Despus de cualquiera de estos procedimientos, el tejido o lquido que se extraiga se examinar con un microscopio. El procedimiento puede variar segn el mdico y el hospital.   Sander Nephew ocurre despus del procedimiento?  Lo conducirn a la zona de recuperacin. Cuando se encuentre bien y no tenga problemas, podr volver a Medical illustrator.  Es  posible que se le aplique un vendaje compresivo sobre la mama durante 24 a 48 horas. Quiz tambin se Architect un sostn de soporte durante [place].  No conduzca durante 24horas si le administraron un sedante durante el procedimiento. Resumen  La biopsia de mama es un procedimiento en el cual se extrae Truddie Coco de tejido mamario y se la examina con microscopio para determinar si hay clulas cancerosas.  Este es un procedimiento seguro, pero pueden presentarse problemas, entre ellos sangrado, infeccin, dolor y [place].  Consulte a su mdico si debe cambiar o suspender los medicamentos que toma habitualmente.  Haga que alguien lo lleve a su casa desde el hospital o la clnica. Esta informacin no tiene Marine scientist el consejo del mdico. Asegrese de hacerle al mdico cualquier pregunta que tenga. Document Revised: 10/23/2017 Document Reviewed: 10/23/2017 Elsevier Patient Education  2021 [place].   Breast Biopsy A breast biopsy is a procedure in which a sample of breast tissue is removed from the breast and examined under a microscope to see if cancerous cells are present. You may need a breast biopsy if you have:  Any undiagnosed breast mass (tumor).  Nipple abnormalities, dimpling, crusting, or ulcerations.  Abnormal discharge from the nipple, especially blood.  Redness, swelling, and pain of the breast.  Calcium deposits (calcifications) or abnormalities  seen on a mammogram, ultrasound results, or MRI results.  Abnormal changes in the breast seen on your mammogram. If the breast abnormality is found to be cancerous (malignant), a breast biopsy can help to determine what the best treatment is for you. There are many different types of breast biopsies. Talk with your health care provider about your options and which type is best for you. Tell a health care provider about:  Any allergies you have.  All medicines you are taking, including vitamins,  herbs, eye drops, creams, and over-the-counter medicines.  Any problems you or family members have had with anesthetic medicines.  Any blood disorders you have.  Any surgeries you have had.  Any medical conditions you have.  Whether you are pregnant or may be pregnant. What are the risks? Generally, this is a safe procedure. However, problems may occur, including:  Discomfort. This is temporary.  Bruising and swelling of the breast.  Changes in the shape of the breast.  Bleeding.  Infection.  Damage to other tissues.  Allergic reactions to medicines.  Needing more surgery. What happens before the procedure? Medicines Ask your health care provider about:  Changing or stopping your regular medicines. This is especially important if you are taking diabetes medicines or blood thinners.  Taking medicines such as aspirin and ibuprofen. These medicines can thin your blood. Do not take these medicines unless your health care provider tells you to take them.  Taking over-the-counter medicines, vitamins, herbs, and supplements. Lifestyle  Do not use any products that contain nicotine or tobacco, such as cigarettes, e-cigarettes, and chewing tobacco. If you need help quitting, ask your health care provider.  Do not drink alcohol for 24 hours before the procedure.  Wear a good support bra to the procedure. Eating and drinking restrictions Talk to your health care provider about when you should stop eating and drinking.  You may be asked not to drink or eat for 2-8 hours before the breast biopsy.  In some cases, you may be allowed to eat a light breakfast. General instructions  Plan to have someone take you home from the hospital or clinic.  Ask your health care provider how your surgical site will be marked or identified.  Ask your health care provider what steps will be taken to help prevent infection. These may include: ? Removing hair at the surgery site. ? Washing  skin with a germ-killing soap.  Your health care provider may do a procedure to locate and mark the tumor area in your breast (localization). This will help guide your surgeon to where the biopsy or incision is made. This may be done with: ? Imaging, such as a mammogram, ultrasound, or MRI. ? Insertion of special wire, clip, seed, or radar reflector implant in the tumor area. What happens during the procedure?  You may be given one or more of the following: ? A medicine to numb the breast area (local anesthetic). ? A medicine to help you relax (sedative). ? A medicine to make you fall asleep (general anesthetic).  Your health care provider will perform the biopsy using only one of the following methods. He or she will do: ? Fine needle aspiration. A thin needle with a syringe will be inserted into a breast cyst. Fluid and cells will be removed. ? Core needle biopsy. A wide, hollow needle (core needle) will be inserted into a breast lump multiple times to remove tissue samples or cores. ? Stereotactic biopsy. X-rays and a computer will be  used to locate the breast lump. The surgeon will use the X-ray images to collect several samples of tissue using a needle. ? Vacuum-assisted biopsy. A small incision will be made in your breast. A hollow needle and vacuum will be passed through the incision and into the breast tissue. The vacuum will gently draw abnormal breast tissue into the needle to remove it. ? Ultrasound-guided core needle biopsy. An ultrasound will be used to help guide the core needle to the area of the mass or abnormality. An incision will be made to insert the needle. Then tissue samples will be removed. ? Surgical biopsy. An incision will be made in the breast to remove part or all of the abnormal tissue. After the tissue is removed, the skin over the area will be closed with sutures and covered with a dressing. There are two types of surgical biopsies:  Incisional biopsy. The surgeon  will remove part of the breast lump.  Excisional biopsy. The surgeon will attempt to remove the whole breast lump or as much of it as possible. After any of these procedures, the tissue or fluid that was removed will be examined under a microscope. The procedure may vary among health care providers and hospitals.   What happens after the procedure?  You will be taken to the recovery area. If you are doing well and have no problems, you will be allowed to go home.  You may have a pressure dressing applied on your breast for 24-48 hours. You may also be advised to wear a supportive bra during this time.  Do not drive for 24 hours if you were given a sedative during your procedure. Summary  A breast biopsy is a procedure in which a sample of breast tissue is removed from the breast and examined under a microscope to see if cancerous cells are present.  This is a safe procedure, but problems can occur, including bleeding, infection, pain, and bruising.  Ask your health care provider about changing or stopping your regular medicines.  Plan to have someone take you home from the hospital or clinic. This information is not intended to replace advice given to you by your health care provider. Make sure you discuss any questions you have with your health care provider. Document Revised: 11/25/2019 Document Reviewed: 08/30/2017 Elsevier Patient Education  2021 [place].  What are the treatments for breast pain? Use less salt. Wear a supportive bra. Apply local heat to the painful area. Take over-the-counter pain relievers sparingly, as needed. Avoid caffeine. Well-designed studies have not shown that avoiding caffeine can treat breast pain. However, many women report significant improvement in their symptoms when they reduce their intake of tea, coffee, chocolate, and energy drinks. Try Vitamin E. Studies have not consistently shown benefits of vitamin E for treating breast pain, though  some women find it helpful. Using vitamin E for a few weeks to see if it will help is unlikely to cause any harm. However, long-term use of vitamin E supplementation is not recommended for breast pain, as there are some studies suggesting this may not be safe. Try evening primrose oil. Similar to vitamin E, studies have not consistently shown evening primrose oil to be helpful in treating breast pain, though it does help some women. Evening primrose oil is found over-the-counter. Side effects might include nausea, diarrhea, and headaches. In the past, there was concern that certain patients might be at increased risk of seizures when taking this supplement, though this is now disputed.  Try Omega-3 fatty acid. Though not proven to be effective in rigorous studies, some women find increased intake of fish oils/omega-3 supplements to be helpful. Natural dietary sources include: dark green leafy vegetables, ocean-raised ("wild") cold-water fish, flax, walnuts, and sesame. Omega-3 supplements are also available by prescription and over-the-counter. Give it time. Most commonly, pain goes away on its own after a few months, without the need for any treatment.  Cules son los tratamientos para Conservation officer, historic buildings de [place]? [place] menos sal. [place] un sostn de apoyo. Aplicar calor local en la zona dolorida. Tome analgsicos de venta libre con moderacin, segn sea necesario. Evite la cafena. Los estudios bien diseados no han demostrado que evitar la cafena pueda tratar el dolor de senos. Sin embargo, muchas mujeres informan una mejora significativa en sus sntomas cuando reducen la ingesta de t, caf, chocolate y bebidas energticas. Pruebe la vitamina E. Los estudios no han [employer] beneficios de la vitamina E para tratar Conservation officer, historic buildings de senos, aunque a algunas mujeres les resulta til. Es poco probable que el uso de vitamina E durante algunas semanas para ver si ayuda cause algn dao. Sin embargo, no se  recomienda el uso a largo plazo de suplementos de vitamina E para Conservation officer, historic buildings de senos, ya que algunos estudios sugieren que esto puede no ser seguro. Prueba el aceite de [place] Al igual que la vitamina E, los estudios no han demostrado consistentemente que el aceite de [place] sea til para Regulatory affairs officer de senos, aunque ayuda a algunas mujeres. El aceite de [place] se encuentra sin receta. Los efectos secundarios pueden incluir nuseas, diarrea y dolores de [place]. En el pasado, exista la preocupacin de que ciertos pacientes pudieran tener un mayor riesgo de convulsiones al tomar este suplemento, aunque esto ahora se discute. Pruebe el cido graso Omega-3. Aunque no se ha demostrado que sea eficaz en estudios rigurosos, algunas mujeres encuentran til aumentar la ingesta de suplementos de aceites de pescado/omega-3. Las fuentes dietticas naturales incluyen: verduras de hoja verde oscuro, pescado de agua fra criado en el ocano ("salvaje"), lino, nueces y ssamo. Los suplementos de omega-3 tambin estn disponibles con receta y sin receta. Gala [place]. Lo ms comn es que el dolor desaparezca por s solo despus de unos meses, sin necesidad de Medical laboratory scientific officer.

## 2020-06-26 DIAGNOSIS — N6459 Other signs and symptoms in breast: Secondary | ICD-10-CM | POA: Insufficient documentation
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
## 2020-06-30 ENCOUNTER — Ambulatory Visit (HOSPITAL_COMMUNITY)
Admission: RE | Admit: 2020-06-30 | Discharge: 2020-06-30 | Disposition: A | Discharge status: Home / Self Care | Payer: Self-pay | Source: Ambulatory Visit | Attending: General Surgery | Admitting: General Surgery

## 2020-06-30 ENCOUNTER — Telehealth: Payer: Self-pay | Admitting: Internal Medicine

## 2020-06-30 DIAGNOSIS — R928 Other abnormal and inconclusive findings on diagnostic imaging of breast: Secondary | ICD-10-CM

## 2020-06-30 MED ORDER — LIDOCAINE HCL (PF) 2 % IJ SOLN
INTRAMUSCULAR | Status: AC
  Filled 2020-06-30: qty 20

## 2020-06-30 NOTE — Telephone Encounter (Signed)
[phone] PLEASE CALL PATIENT DAUGHTER, JESSICA, ABOUT THE PATIENT H PYLORI BREATH TEST,  THE LAB DOES NOT TAKE [facility] ASSISTANCE.

## 2020-07-01 NOTE — Telephone Encounter (Signed)
Lets try the stool ag and see if its cheaper than breath test thanks

## 2020-07-01 NOTE — Telephone Encounter (Signed)
Phoned and spoke with lab @ [facility] regarding that test and they will do it but they could not tell me if they deal with patient assistance. So do you want to try and send the pt for that test or what do you want to do. Please advise

## 2020-07-01 NOTE — Telephone Encounter (Signed)
I spoke with the pt's daughter Joyce Roberts and was advised that [facility] does not take [facility] Assistance and that [facility] lab does not do H Pylori breath test. Pt wants to know what to do next.  They are stating they don't have the money to pay. Please advise what is next.

## 2020-07-01 NOTE — Telephone Encounter (Signed)
Is h pylori stool antigen test cheaper? That would be another option. Thanks

## 2020-07-02 NOTE — Patient Instructions (Signed)
Joyce Roberts  07/02/2020     @PREFPERIOPPHARMACY @   Your procedure is scheduled on  07/06/2020   Report to [facility] at  0900  A.M.   Call this number if you have problems the morning of surgery:  [phone]   Remember:  Do not eat or drink after midnight.                      Take these medicines the morning of surgery with A SIP OF WATER  Zyrtec, prilosec, mybetriq.  Put clean sheets on your bed the night before your procedure and DO NOT sleep with pets this night.  Shower with CHG the night before and the morning of your procedure. DO NOT put CHG on your face, hair or genitals.  After each shower, dry off  with a clean towel, put on clean comfortable clothes and brush your teeth.      Do not wear jewelry, make-up or nail polish.  Do not wear lotions, powders, or perfumes, or deodorant.  Do not shave 48 hours prior to surgery.  Men may shave face and neck.  Do not bring valuables to the hospital.  [facility] is not responsible for any belongings or valuables.   Contacts, dentures or bridgework may not be worn into surgery.  Leave your suitcase in the car.  After surgery it may be brought to your room.  For patients admitted to the hospital, discharge time will be determined by your treatment team.  Patients discharged the day of surgery will not be allowed to drive home and must have someone with them for 24 hours.    Special instructions:  DO NOT smoke tobacco or vape for 24 hours before your procedure.   Please read over the following fact sheets that you were given. Pain Booklet, Coughing and Deep Breathing, Anesthesia Post-op Instructions and Care and Recovery After Surgery                                                   Instrucciones Para Antes de la Ciruga   Su ciruga est programada para 07/06/2020 @ 0900   [place] -     Por favor llame al [phone] si tiene algn problema en la maana de la ciruga.                   Recuerde   No coma alimentos ni tome lquidos, incluyendo agua, despus de la medianoche del 07/05/2020.    [employer] medicinas en la maana de la ciruga con un SORBITO de agua  Zyrtec, prilosec, [place].   Puede cepillarse los dientes en la maana de la [place].   No use joyas, maquillaje de ojos, lpiz labial, crema para el cuerpo o esmalte de uas oscuro. No desodorante.      Si va a ser ingresado despues de la ciruga, deje la [employer] en el carro hasta que se le haya asignado una habitacin.    A los pacientes que se les d de alta el mismo da no se les permitir manejar a casa.     Use ropa suelta y cmoda de regreso a casa.      Biopsia de mama - Cuidados posteriores Breast Biopsy, Care After Esta hoja le brinda informacin sobre cmo cuidarse  despus del procedimiento. El mdico tambin podr darle indicaciones ms especficas. Comunquese con el mdico si tiene problemas o preguntas. Qu puedo esperar despus del procedimiento? Despus del procedimiento, es comn [employer] siguientes sntomas:  Presentar hematomas en la mama.  Adormecimiento, hormigueo o dolor cerca del lugar de la biopsia. Siga estas indicaciones en su casa: Medicamentos  Tome los medicamentos de venta libre y los recetados solamente como se lo haya indicado el mdico.  No conduzca durante 24horas si le administraron un sedante durante el procedimiento.  No beba alcohol si toma analgsicos.  No conduzca ni use maquinaria pesada mientras toma analgsicos recetados. Cuidado del Environmental consultant de la biopsia  Siga las indicaciones del mdico acerca del cuidado de la incisin o del lugar de la puncin. Asegrese de hacer lo siguiente: ? Lvese las manos con agua y jabn antes de Quarry manager las vendas (vendaje). Use desinfectante para manos si no dispone de [place]. ? Cambie el vendaje como se lo haya indicado el mdico. ? No retire los puntos (suturas), la goma para cerrar la piel o las tiras  [place]. Es posible que estos cierres cutneos Animal nutritionist en la piel durante 2semanas o ms tiempo. Si los bordes de las tiras adhesivas empiezan a despegarse y Therapist, sports, puede recortar los que estn sueltos. No retire las tiras [facility] por completo a menos que el mdico se lo indique.  Si tiene suturas, mantngalas secas al baarse.  Controle todos los das el rea de la incisin o de la puncin para Hydrographic surveyor signos de infeccin. Est atento a los siguientes signos: ? Enrojecimiento, hinchazn o dolor. ? Lquido o sangre. ? Calor. ? Pus o mal olor.  Proteja la zona de la biopsia. No deje que la zona se inflame.      Actividad  Si le hicieron una incisin durante el procedimiento, evite las actividades que podran tironear ese lugar y abrirlo. Paediatric nurse, estirar los brazos por encima de la cabeza, Field seismologist ejercicios, practicar deportes o levantar objetos que pesen ms de 3libras (1,4kg).  Retome sus actividades normales como se lo haya indicado el mdico. Pregntele al mdico qu actividades son seguras para usted. Control del dolor  Si se lo indican, coloque hielo en el lugar de la biopsia para Best boy a la palpacin: ? Field seismologist hielo en una bolsa plstica. ? Coloque una [employer] piel y Therapist, nutritional. ? Coloque el hielo durante 23minutos, 2 a 3veces por da. Indicaciones generales  Siga con su dieta habitual.  Use un buen sostn de soporte durante todo el tiempo que se lo haya indicado el mdico.  Somtase a exmenes para comprobar si hay lquido adicional alrededor [employer] linfticos (linfedema) con la frecuencia que le indique el mdico.  Concurra a todas las visitas de control como se lo haya indicado el mdico. Esto es importante. Comunquese con un mdico si:  El enrojecimiento, la hinchazn o Biochemist, clinical de la biopsia [place].  Observa ms lquido o sangre que salen del lugar de la biopsia.  El lugar de la biopsia se siente  caliente al tacto.  Tiene pus o percibe mal olor que proviene del lugar de la biopsia.  El lugar de la biopsia se abre despus de que le han retirado las suturas o las tiras [place].  Tiene una erupcin cutnea.  Tiene fiebre. Solicite ayuda inmediatamente si tiene:  Mayor sangrado (ms que una pequea mancha) en el lugar de la biopsia.  Dificultad para  respirar.  Rayas rojas alrededor del lugar de la biopsia. Resumen  Despus del procedimiento, es frecuente tener moretones, entumecimiento, hormigueo o dolor cerca del lugar de la biopsia.  No conduzca ni use maquinaria pesada mientras toma analgsicos recetados.  Use un buen sostn de soporte durante todo el tiempo que se lo haya indicado el mdico.  Si le hicieron una incisin durante el procedimiento, evite las actividades que podran tironear ese lugar y abrirlo. Pregntele al mdico qu actividades son seguras para usted. Esta informacin no tiene Marine scientist el consejo del mdico. Asegrese de hacerle al mdico cualquier pregunta que tenga. Document Revised: 10/23/2017 Document Reviewed: 10/23/2017 Elsevier Patient Education  2021 [place] general en adultos, cuidados posteriores General Anesthesia, Adult, Care After Esta hoja le brinda informacin sobre cmo cuidarse despus del procedimiento. El mdico tambin podr darle instrucciones ms especficas. Comunquese con el mdico si tiene problemas o preguntas. Qu puedo esperar despus del procedimiento? Luego del procedimiento, son comunes los siguiente efectos secundarios:  Dolor o Scientist, research (life sciences) en el lugar de la va intravenosa (i.v.).  Nuseas.  Vmitos.  El dolor de Investment banker, operational.  Dificultad para concentrarte.  Sentir fro o [employer].  Sensacin de debilidad o cansancio.  Somnolencia y Programmer, applications.  Malestar y Hydrologist. Estos efectos secundarios pueden afectar partes del cuerpo que no estuvieron involucradas en la  ciruga. Siga estas instrucciones en su casa: Durante el perodo de [employer] le haya indicado el mdico:  Descanse.  No participe en actividades que impliquen posibles cadas o lesiones.  No conduzca ni opere maquinaria.  No beba alcohol.  No tome somnferos ni medicamentos que causen somnolencia.  No tome decisiones trascendentes ni firme documentos importantes.  No cuide a nios por su cuenta.   Comida y bebida  Siga las indicaciones del mdico respecto de las restricciones de comidas o bebidas.  Cuando [employer], comience a comer cantidades pequeas de alimentos que sean blandos y fciles de Publishing copy (livianos), como una tostada. Retome su dieta habitual de forma gradual.  Beber suficiente lquido como para mantener la orina de color amarillo plido.  Si vomita, rehidrtese tomando agua, jugo o caldo transparente. Instrucciones generales  Si tiene apnea del sueo, la [place] y ciertos medicamentos pueden elevar su riesgo de tener problemas respiratorios. Siga las instrucciones del mdico respecto al uso del dispositivo para dormir: ? Siempre que duerma, incluso durante las siestas que tome en el da. ? Mientras tome analgsicos recetados, medicamentos para dormir o medicamentos que producen somnolencia.  Pida a un adulto responsable que se quede con usted durante el tiempo que se le indique. Es importante tener a alguien que lo ayude a cuidarse hasta que est despierto y Press photographer.  Retome sus actividades normales segn lo indicado por el mdico. Pregntele al mdico qu actividades son seguras para usted.  Use los medicamentos de venta libre y los recetados solamente como se lo haya indicado el mdico.  Si fuma, no lo haga sin supervisin.  Concurra a todas las visitas de seguimiento como se lo haya indicado el mdico. Esto es importante. Comunquese con un mdico si:  Tiene nuseas o vmitos que no mejoran con medicamentos.  No puede comer ni beber sin  vomitar.  Su dolor no se alivia con medicamentos.  No puede orinar.  Tiene una erupcin cutnea.  Tiene fiebre.  Presenta enrojecimiento alrededor del lugar de la va intravenosa (i.v.) que empeora. Solicite ayuda de inmediato si:  Tienes dificultad para respirar.  Sientes dolor  en el pecho.  Observa sangre en la orina o heces, o vomita sangre. Resumen  Despus del procedimiento, es comn tener dolor de garganta y nuseas. Tambin es comn sentirse cansado.  Pida a un adulto responsable que se quede con usted durante el tiempo que se le indique. Es importante tener a alguien que lo ayude a cuidarse hasta que est despierto y Press photographer.  Cuando [employer], comience a comer cantidades pequeas de alimentos que sean blandos y fciles de Publishing copy (livianos), como una tostada. Retome su dieta habitual de forma gradual.  Beber suficiente lquido como para mantener la orina de color amarillo plido.  Retome sus actividades normales segn lo indicado por el mdico. Pregntele al mdico qu actividades son seguras para usted. Esta informacin no tiene Marine scientist el consejo del mdico. Asegrese de hacerle al mdico cualquier pregunta que tenga. Document Revised: 09/05/2019 Document Reviewed: 09/05/2019 Elsevier Patient Education  2021 [place].        Breast Biopsy, Care After These instructions give you information about caring for yourself after your procedure. Your doctor may also give you more specific instructions. Call your doctor if you have any problems or questions after your procedure. What can I expect after the procedure? After your procedure, it is common to have:  Bruising on your breast.  Numbness, tingling, or pain near your biopsy site. Follow these instructions at home: Medicines  Take over-the-counter and prescription medicines only as told by your doctor.  Do not drive for 24 hours if you were given a medicine to help you relax (sedative)  during your procedure.  Do not drink alcohol while taking pain medicine.  Do not drive or use heavy machinery while taking prescription pain medicine. Biopsy site care  Follow instructions from your doctor about how to take care of your cut from surgery (incision) or your puncture area. Make sure you: ? Wash your hands with soap and water before you change your bandage (dressing). If you cannot use soap and water, use hand sanitizer. ? Change your bandage as told by your doctor. ? Leave stitches (sutures), skin glue, or skin tape (adhesive strips) in place. They may need to stay in place for 2 weeks or longer. If tape strips get loose and curl up, you may trim the loose edges. Do not remove tape strips completely unless your doctor says it is okay.  If you have stitches, keep them dry when you take a bath or a shower.  Check your cut or puncture area every day for signs of infection. Check for: ? Redness, swelling, or pain. ? Fluid or blood. ? Warmth. ? Pus or a bad smell.  Protect the biopsy area. Do not let the area get bumped.      Activity  If you had a cut during your procedure, avoid activities that could pull your cut open. These include: ? Stretching. ? Reaching over your head. ? Exercise. ? Sports. ? Lifting anything that weighs more than 3 lb (1.4 kg).  Return to your normal activities as told by your doctor. Ask your doctor what activities are safe for you. Managing pain, stiffness, and swelling If told, put ice on the biopsy site to relieve swelling:  Put ice in a plastic bag.  Place a towel between your skin and the bag.  Leave the ice on for 20 minutes, 2-3 times a day. General instructions  Continue your normal diet.  Wear a good support bra for as long as told by  your doctor.  Get checked for extra fluid around your lymph nodes (lymphedema) as often as told by your doctor.  Keep all follow-up visits as told by your doctor. This is important. Contact a  doctor if:  You notice any of the following at the biopsy site: ? More redness, swelling, or pain. ? More fluid or blood coming from the site. ? The site feels warm to the touch. ? Pus or a bad smell coming from the site. ? The site breaks open after the stitches or skin tape strips have been removed.  You have a rash.  You have a fever. Get help right away if:  You have more bleeding from the biopsy site. Get help right away if bleeding is more than a small spot.  You have trouble breathing.  You have red streaks around the biopsy site. Summary  After your procedure, it is common to have bruising, numbness, tingling, or pain near the biopsy site.  Do not drive or use heavy machinery while taking prescription pain medicine.  Wear a good support bra for as long as told by your doctor.  If you had a cut during your procedure, avoid activities that may pull the cut open. Ask your doctor what activities are safe for you. This information is not intended to replace advice given to you by your health care provider. Make sure you discuss any questions you have with your health care provider. Document Revised: 11/25/2019 Document Reviewed: 08/31/2017 Elsevier Patient Education  2021 [place] Anesthesia, Adult, Care After This sheet gives you information about how to care for yourself after your procedure. Your health care provider may also give you more specific instructions. If you have problems or questions, contact your health care provider. What can I expect after the procedure? After the procedure, the following side effects are common:  Pain or discomfort at the IV site.  Nausea.  Vomiting.  Sore throat.  Trouble concentrating.  Feeling cold or chills.  Feeling weak or tired.  Sleepiness and fatigue.  Soreness and body aches. These side effects can affect parts of the body that were not involved in surgery. Follow these instructions at home: For the  time period you were told by your health care provider:  Rest.  Do not participate in activities where you could fall or become injured.  Do not drive or use machinery.  Do not drink alcohol.  Do not take sleeping pills or medicines that cause drowsiness.  Do not make important decisions or sign legal documents.  Do not take care of children on your own.   Eating and drinking  Follow any instructions from your health care provider about eating or drinking restrictions.  When you feel hungry, start by eating small amounts of foods that are soft and easy to digest (bland), such as toast. Gradually return to your regular diet.  Drink enough fluid to keep your urine pale yellow.  If you vomit, rehydrate by drinking water, juice, or clear broth. General instructions  If you have sleep apnea, surgery and certain medicines can increase your risk for breathing problems. Follow instructions from your health care provider about wearing your sleep device: ? Anytime you are sleeping, including during daytime naps. ? While taking prescription pain medicines, sleeping medicines, or medicines that make you drowsy.  Have a responsible adult stay with you for the time you are told. It is important to have someone help care for you until you are awake and alert.  Return to your normal activities as told by your health care provider. Ask your health care provider what activities are safe for you.  Take over-the-counter and prescription medicines only as told by your health care provider.  If you smoke, do not smoke without supervision.  Keep all follow-up visits as told by your health care provider. This is important. Contact a health care provider if:  You have nausea or vomiting that does not get better with medicine.  You cannot eat or drink without vomiting.  You have pain that does not get better with medicine.  You are unable to pass urine.  You develop a skin rash.  You have a  fever.  You have redness around your IV site that gets worse. Get help right away if:  You have difficulty breathing.  You have chest pain.  You have blood in your urine or stool, or you vomit blood. Summary  After the procedure, it is common to have a sore throat or nausea. It is also common to feel tired.  Have a responsible adult stay with you for the time you are told. It is important to have someone help care for you until you are awake and alert.  When you feel hungry, start by eating small amounts of foods that are soft and easy to digest (bland), such as toast. Gradually return to your regular diet.  Drink enough fluid to keep your urine pale yellow.  Return to your normal activities as told by your health care provider. Ask your health care provider what activities are safe for you. This information is not intended to replace advice given to you by your health care provider. Make sure you discuss any questions you have with your health care provider. Document Revised: 11/28/2019 Document Reviewed: 06/27/2019 Elsevier Patient Education  2021 [employer].

## 2020-07-03 ENCOUNTER — Other Ambulatory Visit: Payer: Self-pay

## 2020-07-03 ENCOUNTER — Other Ambulatory Visit (HOSPITAL_COMMUNITY)
Admission: RE | Admit: 2020-07-03 | Discharge: 2020-07-03 | Disposition: A | Discharge status: Home / Self Care | Payer: Self-pay | Source: Ambulatory Visit | Attending: General Surgery | Admitting: General Surgery

## 2020-07-03 ENCOUNTER — Encounter (HOSPITAL_COMMUNITY)
Admission: RE | Admit: 2020-07-03 | Discharge: 2020-07-03 | Disposition: A | Discharge status: Home / Self Care | Payer: Self-pay | Source: Ambulatory Visit | Attending: General Surgery | Admitting: General Surgery

## 2020-07-03 ENCOUNTER — Encounter (HOSPITAL_COMMUNITY): Payer: Self-pay

## 2020-07-03 DIAGNOSIS — Z20822 Contact with and (suspected) exposure to covid-19: Secondary | ICD-10-CM | POA: Insufficient documentation

## 2020-07-03 DIAGNOSIS — Z01812 Encounter for preprocedural laboratory examination: Secondary | ICD-10-CM | POA: Insufficient documentation

## 2020-07-03 LAB — CBC
HCT: 36.5 % (ref 36.0–46.0)
Hemoglobin: 11.5 g/dL — ABNORMAL LOW (ref 12.0–15.0)
MCH: 25 pg — ABNORMAL LOW (ref 26.0–34.0)
MCHC: 31.5 g/dL (ref 30.0–36.0)
MCV: 79.3 fL — ABNORMAL LOW (ref 80.0–100.0)
Platelets: 265 10*3/uL (ref 150–400)
RBC: 4.6 MIL/uL (ref 3.87–5.11)
RDW: 17.5 % — ABNORMAL HIGH (ref 11.5–15.5)
WBC: 5.9 10*3/uL (ref 4.0–10.5)
nRBC: 0 % (ref 0.0–0.2)

## 2020-07-03 LAB — SARS CORONAVIRUS 2 (TAT 6-24 HRS): SARS Coronavirus 2: NEGATIVE

## 2020-07-03 LAB — HCG, SERUM, QUALITATIVE: Preg, Serum: NEGATIVE

## 2020-07-03 NOTE — Progress Notes (Addendum)
Sonia Heidrich,  Spanish interpreter/[place] with patient for pre admission testing

## 2020-07-06 ENCOUNTER — Ambulatory Visit (HOSPITAL_COMMUNITY): Payer: Self-pay | Admitting: Anesthesiology

## 2020-07-06 ENCOUNTER — Ambulatory Visit (HOSPITAL_COMMUNITY)
Admission: RE | Admit: 2020-07-06 | Discharge: 2020-07-06 | Disposition: A | Discharge status: Home / Self Care | Payer: Self-pay | Attending: General Surgery | Admitting: General Surgery

## 2020-07-06 ENCOUNTER — Encounter (HOSPITAL_COMMUNITY)
Admission: RE | Disposition: A | Discharge status: Home / Self Care | Payer: Self-pay | Source: Home / Self Care | Attending: General Surgery

## 2020-07-06 ENCOUNTER — Ambulatory Visit (HOSPITAL_COMMUNITY): Payer: Self-pay

## 2020-07-06 DIAGNOSIS — Z803 Family history of malignant neoplasm of breast: Secondary | ICD-10-CM | POA: Insufficient documentation

## 2020-07-06 DIAGNOSIS — Z888 Allergy status to other drugs, medicaments and biological substances status: Secondary | ICD-10-CM | POA: Insufficient documentation

## 2020-07-06 DIAGNOSIS — N6459 Other signs and symptoms in breast: Secondary | ICD-10-CM | POA: Insufficient documentation

## 2020-07-06 DIAGNOSIS — N6012 Diffuse cystic mastopathy of left breast: Secondary | ICD-10-CM | POA: Insufficient documentation

## 2020-07-06 DIAGNOSIS — Z881 Allergy status to other antibiotic agents status: Secondary | ICD-10-CM | POA: Insufficient documentation

## 2020-07-06 DIAGNOSIS — N6082 Other benign mammary dysplasias of left breast: Secondary | ICD-10-CM | POA: Insufficient documentation

## 2020-07-06 DIAGNOSIS — D369 Benign neoplasm, unspecified site: Secondary | ICD-10-CM

## 2020-07-06 DIAGNOSIS — N644 Mastodynia: Secondary | ICD-10-CM | POA: Insufficient documentation

## 2020-07-06 DIAGNOSIS — N6452 Nipple discharge: Secondary | ICD-10-CM | POA: Insufficient documentation

## 2020-07-06 DIAGNOSIS — Z79899 Other long term (current) drug therapy: Secondary | ICD-10-CM | POA: Insufficient documentation

## 2020-07-06 DIAGNOSIS — L719 Rosacea, unspecified: Secondary | ICD-10-CM

## 2020-07-06 HISTORY — PX: BREAST LUMPECTOMY WITH RADIOFREQUENCY TAG IDENTIFICATION: SHX6884

## 2020-07-06 SURGERY — BREAST LUMPECTOMY WITH RADIOFREQUENCY TAG IDENTIFICATION
Anesthesia: General | Site: Breast | Laterality: Left

## 2020-07-06 MED ORDER — OXYCODONE HCL 5 MG PO TABS: 5.0000 mg | ORAL_TABLET | ORAL | 0 refills | Status: DC | PRN

## 2020-07-06 MED ORDER — MIDAZOLAM HCL 2 MG/2ML IJ SOLN
INTRAMUSCULAR | Status: AC
  Filled 2020-07-06: qty 2

## 2020-07-06 MED ORDER — FENTANYL CITRATE (PF) 100 MCG/2ML IJ SOLN
INTRAMUSCULAR | Status: AC
  Filled 2020-07-06: qty 2

## 2020-07-06 MED ORDER — PROPOFOL 10 MG/ML IV BOLUS
INTRAVENOUS | Status: DC | PRN
  Administered 2020-07-06: 160 mg via INTRAVENOUS

## 2020-07-06 MED ORDER — BUPIVACAINE HCL (PF) 0.5 % IJ SOLN
INTRAMUSCULAR | Status: AC
  Filled 2020-07-06: qty 30

## 2020-07-06 MED ORDER — KETOROLAC TROMETHAMINE 30 MG/ML IJ SOLN
INTRAMUSCULAR | Status: AC
  Filled 2020-07-06: qty 1

## 2020-07-06 MED ORDER — MEPERIDINE HCL 50 MG/ML IJ SOLN: 6.2500 mg | INTRAMUSCULAR | Status: DC | PRN

## 2020-07-06 MED ORDER — SODIUM CHLORIDE 0.9 % IR SOLN
Status: DC | PRN
  Administered 2020-07-06: 1000 mL

## 2020-07-06 MED ORDER — PROPOFOL 10 MG/ML IV BOLUS
INTRAVENOUS | Status: AC
  Filled 2020-07-06: qty 40

## 2020-07-06 MED ORDER — CEFAZOLIN SODIUM-DEXTROSE 2-4 GM/100ML-% IV SOLN
INTRAVENOUS | Status: AC
  Filled 2020-07-06: qty 100

## 2020-07-06 MED ORDER — SCOPOLAMINE 1 MG/3DAYS TD PT72
MEDICATED_PATCH | TRANSDERMAL | Status: AC
  Administered 2020-07-06: 1.5 mg via TRANSDERMAL
  Filled 2020-07-06: qty 1

## 2020-07-06 MED ORDER — FENTANYL CITRATE (PF) 100 MCG/2ML IJ SOLN
INTRAMUSCULAR | Status: DC | PRN
  Administered 2020-07-06 (×5): 25 ug via INTRAVENOUS

## 2020-07-06 MED ORDER — CHLORHEXIDINE GLUCONATE CLOTH 2 % EX PADS: 6.0000 | MEDICATED_PAD | Freq: Once | CUTANEOUS | Status: DC

## 2020-07-06 MED ORDER — DEXAMETHASONE SODIUM PHOSPHATE 10 MG/ML IJ SOLN
INTRAMUSCULAR | Status: DC | PRN
  Administered 2020-07-06 (×4): 2.5 mg via INTRAVENOUS

## 2020-07-06 MED ORDER — ONDANSETRON HCL 4 MG/2ML IJ SOLN: 4.0000 mg | Freq: Once | INTRAMUSCULAR | Status: DC | PRN

## 2020-07-06 MED ORDER — KETOROLAC TROMETHAMINE 30 MG/ML IJ SOLN
30.0000 mg | Freq: Once | INTRAMUSCULAR | Status: AC
  Administered 2020-07-06: 30 mg via INTRAVENOUS

## 2020-07-06 MED ORDER — MIDAZOLAM HCL 5 MG/5ML IJ SOLN
INTRAMUSCULAR | Status: DC | PRN
  Administered 2020-07-06: 2 mg via INTRAVENOUS

## 2020-07-06 MED ORDER — HYDROMORPHONE HCL 1 MG/ML IJ SOLN
0.2500 mg | INTRAMUSCULAR | Status: DC | PRN
  Administered 2020-07-06 (×3): 0.5 mg via INTRAVENOUS
  Filled 2020-07-06 (×3): qty 0.5

## 2020-07-06 MED ORDER — CHLORHEXIDINE GLUCONATE 0.12 % MT SOLN
OROMUCOSAL | Status: AC
  Administered 2020-07-06: 15 mL via OROMUCOSAL
  Filled 2020-07-06: qty 15

## 2020-07-06 MED ORDER — CHLORHEXIDINE GLUCONATE 0.12 % MT SOLN: 15.0000 mL | Freq: Once | OROMUCOSAL | Status: AC

## 2020-07-06 MED ORDER — BUPIVACAINE HCL (PF) 0.5 % IJ SOLN
INTRAMUSCULAR | Status: DC | PRN
  Administered 2020-07-06: 10 mL

## 2020-07-06 MED ORDER — ORAL CARE MOUTH RINSE: 15.0000 mL | Freq: Once | OROMUCOSAL | Status: AC

## 2020-07-06 MED ORDER — LIDOCAINE HCL (CARDIAC) PF 100 MG/5ML IV SOSY
PREFILLED_SYRINGE | INTRAVENOUS | Status: DC | PRN
  Administered 2020-07-06: 80 mg via INTRATRACHEAL

## 2020-07-06 MED ORDER — CEFAZOLIN SODIUM-DEXTROSE 2-4 GM/100ML-% IV SOLN
2.0000 g | INTRAVENOUS | Status: AC
  Administered 2020-07-06: 2 g via INTRAVENOUS

## 2020-07-06 MED ORDER — ONDANSETRON HCL 4 MG/2ML IJ SOLN
INTRAMUSCULAR | Status: DC | PRN
  Administered 2020-07-06: 4 mg via INTRAVENOUS

## 2020-07-06 MED ORDER — LACTATED RINGERS IV SOLN: INTRAVENOUS | Status: DC

## 2020-07-06 MED ORDER — EPHEDRINE SULFATE-NACL 50-0.9 MG/10ML-% IV SOSY
PREFILLED_SYRINGE | INTRAVENOUS | Status: DC | PRN
  Administered 2020-07-06 (×3): 5 mg via INTRAVENOUS

## 2020-07-06 MED ORDER — ONDANSETRON HCL 4 MG PO TABS: 4.0000 mg | ORAL_TABLET | Freq: Three times a day (TID) | ORAL | 1 refills | Status: DC | PRN

## 2020-07-06 MED ORDER — SCOPOLAMINE 1 MG/3DAYS TD PT72: 1.0000 | MEDICATED_PATCH | Freq: Once | TRANSDERMAL | Status: DC

## 2020-07-06 SURGICAL SUPPLY — 30 items
ADH SKN CLS APL DERMABOND .7 (GAUZE/BANDAGES/DRESSINGS) ×1
APL PRP STRL LF DISP 70% ISPRP (MISCELLANEOUS) ×1
BLADE SURG 15 STRL LF DISP TIS (BLADE) ×1 IMPLANT
BLADE SURG 15 STRL SS (BLADE) ×2
CHLORAPREP W/TINT 26 (MISCELLANEOUS) ×2 IMPLANT
CLOTH BEACON ORANGE TIMEOUT ST (SAFETY) ×2 IMPLANT
COVER LIGHT HANDLE STERIS (MISCELLANEOUS) ×4 IMPLANT
COVER WAND RF STERILE (DRAPES) ×2 IMPLANT
DECANTER SPIKE VIAL GLASS SM (MISCELLANEOUS) ×2 IMPLANT
DERMABOND ADVANCED (GAUZE/BANDAGES/DRESSINGS) ×1
DERMABOND ADVANCED .7 DNX12 (GAUZE/BANDAGES/DRESSINGS) ×1 IMPLANT
ELECT REM PT RETURN 9FT ADLT (ELECTROSURGICAL) ×2
ELECTRODE REM PT RTRN 9FT ADLT (ELECTROSURGICAL) ×1 IMPLANT
GLOVE SURG ENC MOIS LTX SZ6.5 (GLOVE) ×2 IMPLANT
GLOVE SURG UNDER POLY LF SZ6.5 (GLOVE) ×2 IMPLANT
GLOVE SURG UNDER POLY LF SZ7 (GLOVE) ×4 IMPLANT
GOWN STRL REUS W/TWL LRG LVL3 (GOWN DISPOSABLE) ×4 IMPLANT
KIT MARKER MARGIN INK (KITS) ×2 IMPLANT
KIT TURNOVER KIT A (KITS) ×2 IMPLANT
MANIFOLD NEPTUNE II (INSTRUMENTS) ×2 IMPLANT
NEEDLE HYPO 25X1 1.5 SAFETY (NEEDLE) ×2 IMPLANT
NS IRRIG 1000ML POUR BTL (IV SOLUTION) ×2 IMPLANT
PACK MINOR (CUSTOM PROCEDURE TRAY) ×2 IMPLANT
PAD ARMBOARD 7.5X6 YLW CONV (MISCELLANEOUS) ×2 IMPLANT
SET BASIN LINEN APH (SET/KITS/TRAYS/PACK) ×2 IMPLANT
SET LOCALIZER 20 PROBE US (MISCELLANEOUS) ×2 IMPLANT
SUT MNCRL AB 4-0 PS2 18 (SUTURE) ×2 IMPLANT
SUT VIC AB 3-0 SH 27 (SUTURE) ×2
SUT VIC AB 3-0 SH 27X BRD (SUTURE) ×1 IMPLANT
SYR CONTROL 10ML LL (SYRINGE) ×2 IMPLANT

## 2020-07-06 NOTE — Transfer of Care (Signed)
Immediate Anesthesia Transfer of Care Note  Patient: Joyce Roberts  Procedure(s) Performed: EXCISIONAL BREAST BIOPSY WITH RADIOFREQUENCY TAG IDENTIFICATION (Left Breast)  Patient Location: PACU  Anesthesia Type:General  Level of Consciousness: awake  Airway & Oxygen Therapy: Patient Spontanous Breathing  Post-op Assessment: Report given to RN, Post -op Vital signs reviewed and stable and Patient moving all extremities  Post vital signs: Reviewed and stable  Last Vitals:  Vitals Value Taken Time  BP 127/89 07/06/20 0933  Temp    Pulse 86 07/06/20 0936  Resp 13 07/06/20 0936  SpO2 95 % 07/06/20 0936  Vitals shown include unvalidated device data.  Last Pain:  Vitals:   07/06/20 0650  TempSrc: Oral         Complications: No complications documented.

## 2020-07-06 NOTE — Progress Notes (Signed)
[facility]  Notified daughter surgery completed and issues with biopsy clip not being in the specimen and likely that it was suctioned up. I will plan to see the patient back with an interpretor 4/21 so we can discuss everything thoroughly and go over pathology and discuss the plan for mammogram in 6 weeks.  Curlene Labrum, MD [facility] [address] [place], [place] [id number] [phone] (office)

## 2020-07-06 NOTE — Op Note (Signed)
[facility] Operative Note  07/06/20  Preoperative Diagnosis:  Intraductal papilloma left breast    Postoperative Diagnosis: Same   Procedure(s) Performed:  Excisional left breast biopsy after radiofrequency tag placement    Surgeon: Lanell Matar. Constance Haw, MD   Assistants: No qualified resident was available    Anesthesia: General endotracheal   Anesthesiologist: Dr. Charna Elizabeth    Specimens:  Left breast mass marked with ink (anterior green, posterior black, inferior blue (with additional red inadverently placed); superior red only; lateral orange, medial yellow); additional inferior margin (red ink proximal and blue ink distal to original specimen); additional lateral margin (red ink proximal and blue ink distal to original specimen)    Estimated Blood Loss: 50cc   Blood Replacement: None    Complications: Biopsy clip not found in the specimen or additional margins, likely suctioned up   Wound Class: Clean    Operative Indications:  Ms. Dlugosz is a 50 yo with nipple discharge and findings of a intraductal papilloma on [place]. She had a biopsy with clip placement and she came to me for discuss of excisional biopsy given rare association with cancer and her continued discharge. She reports no recent discharge and she has undergone radiofrequency tag placement.  We discussed risk of bleeding, infection, finding something like cancer and needing more surgery.   Findings: Radiofrequency tag dislodged from the inferior edge of specimen, No biopsy clip on mammograms of specimen    Procedure: The mammograms from the radiofrequency tag placement were reviewed. The patient was taken to the operating room and placed supine. General anesthesia was induced. Intravenous antibiotics were administered per protocol.  The radiofrequency locator was used to identify the area around the inferior lateral areola to be the closest point for incision.  The left breast was prepared and draped in  the usual sterile fashion. The nipple did not express any drainage.  A circumareolar incision was made and carried down through to the breast tissue and flaps were created. Using the handheld radiofrequency probe was used and initially the readings for the tag were 10 mm.  I continued to excision the specimen with Mayo scissors, and suddenly the radiofrequency tag reader showed 1 mm distance to the tag. The area was bloody and suction was used, we did not see tag and continued to proceed as the read changed to 80mm from the tag.  I continued around the specimen excising with scissors, and suddenly the tag was visualized as it likely fell out of the tissue.  From here I excised the specimen and painted it with ink as described above, inadvertently getting blue and red on the inferior margin which was documented for pathology and additional blue was placed on the inferior margin.   This was sent to radiology along with the tag which was not within the specimen as it had become dislodged at some point.  I spoke with radiology and no biopsy clip was noted in the original specimen. I took an additional inferior margin which was inked per above and lateral margin which was inked per above. Neither of these additional margins had the biopsy clip on mammogram imaging.    At this point I have removed a fair amount of retroareola tissue in the lower outer portion of her breast and no clips were noted in the cavity.  The suction tubing and filter were inspected but no clip was identified. The filter was sent to radiology and mammogram was completed but no clip was seen.  I felt  that given the location of the tag and the amount of tissue that I removed the intraductal papilloma and additional sampling would not benefit the patient at this time. I spoke with radiology and made a plan to get a post operative mammogram in 6 weeks to ensure no biopsy clip was seen which would require further excision.   The cavity was  irrigated. Hemostasis was confirmed. The cavity was left open to allow for seroma formation for cosmesis.  The dermal layer was closed with interrupted 3-0 Vicryl suture and the skin was closed with a running 4-0 Monocryl and dermabond.   Final inspection revealed acceptable hemostasis. All counts were correct at the end of the case. The patient was awakened from anesthesia and extubated without complication.  The patient went to the PACU in stable condition.   Curlene Labrum, MD [facility] [address] [place], [place] [address] [phone] (office)

## 2020-07-06 NOTE — Anesthesia Postprocedure Evaluation (Signed)
Anesthesia Post Note  Patient: Joyce Roberts  Procedure(s) Performed: EXCISIONAL BREAST BIOPSY WITH RADIOFREQUENCY TAG IDENTIFICATION (Left Breast)  Patient location during evaluation: PACU Anesthesia Type: General Level of consciousness: awake Pain management: pain level controlled Vital Signs Assessment: post-procedure vital signs reviewed and stable Respiratory status: spontaneous breathing Cardiovascular status: stable Postop Assessment: no apparent nausea or vomiting Anesthetic complications: no   No complications documented.   Last Vitals:  Vitals:   07/06/20 0650  BP: 134/90  Pulse: 76  Resp: 18  Temp: 37 C  SpO2: 100%    Last Pain:  Vitals:   07/06/20 0650  TempSrc: Oral                 Adryan Shin Hristova

## 2020-07-06 NOTE — Anesthesia Procedure Notes (Signed)
Procedure Name: LMA Insertion Date/Time: 07/06/2020 7:40 AM Performed by: Hewitt Blade, CRNA Pre-anesthesia Checklist: Patient identified, Emergency Drugs available, Suction available and Patient being monitored Patient Re-evaluated:Patient Re-evaluated prior to induction Oxygen Delivery Method: Circle system utilized Preoxygenation: Pre-oxygenation with 100% oxygen Induction Type: IV induction Ventilation: Mask ventilation without difficulty LMA: LMA inserted LMA Size: 4.0 Number of attempts: 1 Airway Equipment and Method: Oral airway Placement Confirmation: positive ETCO2 and breath sounds checked- equal and bilateral Tube secured with: Tape Dental Injury: Teeth and Oropharynx as per pre-operative assessment

## 2020-07-06 NOTE — Discharge Instructions (Signed)
I will see you 07/16/2020 in the clinic to check on your wound and discuss the pathology. Your biopsy clip (placed during the biopsy) was not in the specimen and will need to repeat a mammogram in 6 weeks to ensure the area of concern was fully removed.   Te ver el 21/06/2020 en la clnica para revisar tu herida y Clinical research associate. Su clip de biopsia (colocado durante la biopsia) no estaba en la [place] y deber repetir Lavinia Sharps en 6 semanas para asegurarse de que el rea de inters se elimin por completo.   Discharge instructions after breast surgery:   Common Complaints: Pain and bruising at the incision sites.  Swelling at the incision sites. Bruising at the incision site.   Diet/ Activity: Diet as tolerated.  You may shower but do not take hot showers as this can disrupt the glue. Rest and listen to your body, but do not remain in bed all day.  Walk everyday for at least 15-20 minutes. Deep cough and move around every 1-2 hours in the first few days after surgery.  Do not lift > 10 lbs for the first 2 weeks after surgery. Do not do anything that makes you feel like you are putting unnecessary pull or stretch on the incision sites.  Do move your arm and shoulder (see exercises options below). If you do not move then you can get stiff and hurt more.  Do not pick at the dermabond glue on your incision sites.  This glue film will remain in place for 1-2 weeks and will start to peel off.  Do not place lotions or balms on your incision unless instructed to specifically by Dr. Constance Haw.   Pain Expectations and Narcotics: -After surgery you will have pain associated with your incisions and this is normal. The pain is muscular and nerve pain, and will get better with time. -You are encouraged and expected to take non narcotic medications like tylenol and ibuprofen (when able) to treat pain as multiple modalities can aid with pain treatment. -Narcotics are only used when pain is severe  or there is breakthrough pain. -You are not expected to have a pain score of 0 after surgery, as we cannot prevent pain. A pain score of 3-4 that allows you to be functional, move, walk, and tolerate some activity is the goal. The pain will continue to improve over the days after surgery and is dependent on your surgery. -Due to [place] law, we are only able to give a certain amount of pain medication to treat post operative pain, and we only give additional narcotics on a patient by patient basis.  -For most laparoscopic surgery, studies have shown that the majority of patients only need 10-15 narcotic pills, and for open surgeries most patients only need 15-20.   -Having appropriate expectations of pain and knowledge of pain management with non narcotics is important as we do not want anyone to become addicted to narcotic pain medication.  -Using ice packs in the first 48 hours and heating pads after 48 hours, wearing an abdominal binder (when recommended), and using over the counter medications are all ways to help with pain management.   -Simple acts like meditation and mindfulness practices after surgery can also help with pain control and research has proven the benefit of these practices.  Medication: Take tylenol and ibuprofen as needed for pain control, alternating every 4-6 hours.  Example:  Tylenol 1030m @ 6am, 12noon, 6pm, 166mnight (Do not exceed 400058m  of tylenol a day). Ibuprofen 810m @ 9am, 3pm, 9pm, 3am (Do not exceed 36089mof ibuprofen a day).  Take Roxicodone for breakthrough pain every 4 hours.  Take Colace for constipation related to narcotic pain medication. If you do not have a bowel movement in 2 days, take Miralax over the counter.  Drink plenty of water to also prevent constipation.   Contact Information: If you have questions or concerns, please call our office, [phone]- Thursday 8AM-5PM and Friday 8AM-12Noon.  If it is after hours or on the weekend, please  call [facility] Number, [phone] ask to speak to the surgeon on call [facility]   Instrucciones de alta despus de la ciruga mamaria: Quejas comunes: Dolor y hematomas en los sitios de incisin. Hinchazn en los sitios de incisin. Moretones en el sitio de la incisin.  Dieta/ Actividad: Dieta segn tolerancia. Puede ducharse, pero no tome duchas calientes, ya que esto puede alterar el pegamento. Descansa y escucha a tu cuerpo, pero no te quedes en la cama todo elGames developerCamine todos los das durante al menos 15-20 minutos. Tos profunda y moverse cada 1-2 horas en los primeros das despus de la ciLibyan Arab JamahiriyaNo levante ms de 10 libras durante las primeras 2 semanas despus de la ciLibyan Arab JamahiriyaNo haga nada que le haga sentir que est jalando o estirando innecesariamente los sitios de la incisin. Mueva el brazo y el hombro (consulte las opciones de ejercicios a continuacin). Si no se mueve, puede ponerse rgido y doler ms. No toque el pegamento dermabond en los sitios de incisin. Esta pelcula de pegamento permanecer en su lugar durante 1 a 2 semanas y comenzar a despegarse. No aplique lociones ni blsamos en la incisin a menos que el Dr. BrConstance Hawe lo indique especficamente.  Expectativas de doSocial research officer, government narcticos: -Despus de la ciruga tendr dolor asociado con sus incisiones y esto es normal. El dolor es muscular y neFreeburny mejorar con elPhysiological scientist-Se le recomienda y se espera que tome medicamentos no narcticos como Tylenol e ibuprofeno (cuando pueda) para tratar elConservation officer, historic buildingsya que mltiples modalidades pueden ayudar con el tratamiento del doSocial research officer, government-Los narcticos solo se usan cuando el dolor es intenso o hay un dolor irruptivo. -No se espera que tenga una puntuacin de dolor de 0 despus de la ciruga, ya que no podemos prevenir elConservation officer, historic buildingsEl objetivo es una puntuacin de doSocial research officer, governmente 3-4 que le permita ser funcional, moPort Charlottecaminar y toRetail bankerEl doSocial research officer, governmentontinuar mejorando duLimited Brandsosteriores a la ciLibyan Arab Jamahiriya depende de su ciLibyan Arab Jamahiriya-Debido a la ley de CaSmithvillesolo podemos dar una cierta cantidad de analgsicos para tratar elConservation officer, historic buildingsosoperatorio, y solo damos narcticos adicionales paciente por paciente. -Para la mayora de las cirugas laparoscpicas, los estudios han demostrado que la mayora de los pacientes solo necesitan de 10 a 15 pastillas narcticas, y para las cirugas abiertas, la mayora de los pacientes solo necesitan de 15 a 203-Es importante tener expectativas adecuadas sobre el dolor y conocimientos sobre el manejo del dolor con medicamentos no narcticos, ya que no queremos que nadie se vuelva adicto a los anAdvertising copywriter-Usar bolsas de hielo en las primeras 4829oras y almohadillas trmicas despus de 4862oras, usar una faja abdominal (cuando se recomienda) y usar medicamentos de venta libre son formas de ayudar a coFinancial controller-Actos simples como la meditacin y las prcticas de atencin plena despus  de la ciruga tambin pueden ayudar a Financial controller y la investigacin ha demostrado el beneficio de Astronomer.  Medicamento: Tome Tylenol e ibuprofeno segn sea necesario para controlar el dolor, alternando cada 4 a 6 horas. Ejemplo: Tylenol 1000 mg a las 6 am, 12 del medioda, 6 pm, 12 de la noche (no exceda los 4000 mg de tylenol por da). Ibuprofeno 800 mg a las 9 a. m., 3 p. m., 9 p. m., 3 a. m. (No exceda los 3600 mg de ibuprofeno por da). Tome Roxicodona para el dolor irruptivo cada 4 horas. Tome Colace para el estreimiento relacionado con analgsicos narcticos. Si no defeca en 2 das, tome Miralax sin receta. Karma Greaser agua para prevenir tambin el estreimiento.  Informacin del contacto: Si tiene preguntas o inquietudes, llame a Cleotis Nipper oficina al [phone], de lunes a jueves de 8 a. m. a 5 p. m. y viernes de 8 a. m. a 12 p. m. Si es fuera del horario  de atencin o durante el fin de [place], llame al Eduardo Osier [place], [phone], [phone], y pida hablar con el cirujano de guardia del Dr. Constance Haw en [place].      PLEASE REMOVE THE PATCH BEHIND YOUR EAR ON Thursday April 14. 2022.  [place] HANDS AFTER REMOVAL OF PATCH    Scopolamine skin patches Qu es este medicamento? La ESCOPOLAMINA se [place] para prevenir las nuseas y vmitos asociados con los mareos provocados por el movimiento, anestesia y [place]. Este medicamento puede ser utilizado para otros usos; si tiene alguna pregunta consulte con su proveedor de atencin mdica o con su farmacutico. Este medicamento puede ser utilizado para otros usos; si tiene alguna pregunta consulte con su proveedor de atencin mdica o con su farmacutico. MARCAS COMUNES: Transderm Scop Qu le debo informar a mi profesional de la salud antes de tomar este medicamento? Necesitan saber si usted presenta alguno de los siguientes problemas o situaciones:  tiene programada una prueba de secrecin gstrica  glaucoma  enfermedad cardiaca  enfermedad renal  enfermedad heptica  enfermedad pulmonar o respiratoria, como asma  trastorno mental  enfermedad de la prstata  convulsiones  problemas estomacales o intestinales  problemas al orinar  una reaccin alrgica o inusual a la escopolamina, a la atropina, a otros medicamentos, alimentos, colorantes o conservantes  si est embarazada o buscando quedar embarazada  si est amamantando a un beb Cmo debo [employer]? Este medicamento es solo para uso externo. Siga las instrucciones de la etiqueta del [place]. Use solamente 1 parche por vez. Elija un rea detrs de la oreja, que est limpia, seca, sin cabello y [place] de cortes o irritacin. Limpie el rea con un pauelo de papel seco y limpio. Retire la cubierta plstica del parche para la piel, tratando de no tocar el lado ConAgra Foods. No corte los  parches. Aplique el parche firmemente sobre el rea elegida, con el lado metlico hacia la piel y el lado bronceado [place]. Una vez que est firme en su lugar, lvese bien las manos con agua y [place]. Evite que el medicamento entre en contacto con los ojos. Despus de retirar el parche, lvese las manos y el rea detrs de la oreja completamente con agua y [place]. El parche seguir conteniendo algo de medicamento despus de su uso. Para evitar el contacto o la ingestin accidental por parte de nios o mascotas, doble el parche por la mitad con el lado adhesivo junto y arrjelo a la basura fuera  del alcance de nios y Copy. Si necesita usar un segundo parche despus de Environmental health practitioner, colquelo detrs de la otra [place]. Su farmacutico le dar una Gua del medicamento especial (MedGuide, nombre en ingls) con cada receta y en cada ocasin que la vuelva a surtir. Asegrese de leer esta informacin cada vez cuidadosamente. Hable con su pediatra para informarse acerca del uso de este medicamento en nios. Puede requerir atencin especial. Sobredosis: Pngase en contacto inmediatamente con un centro toxicolgico o una sala de urgencia si usted cree que haya tomado demasiado medicamento. ATENCIN: [employer] Foods es solo para usted. No comparta este medicamento con nadie. Sobredosis: Pngase en contacto inmediatamente con un centro toxicolgico o una sala de urgencia si usted cree que haya tomado demasiado medicamento. ATENCIN: [employer] Foods es solo para usted. No comparta este medicamento con nadie. Qu sucede si me olvido de una dosis? No se aplica en este caso. Este medicamento no es para uso regular. Qu puede interactuar con este medicamento?  alcohol  antihistamnicos para [place], tos y resfro  atropina  ciertos medicamentos para la ansiedad o para dormir  ciertos medicamentos para problemas de vejiga, tales como oxibutinina y tolterodina  ciertos medicamentos para la  depresin, tales como amitriptilina, fluoxetina, sertralina  ciertos medicamentos para Training and development officer, tales como diciclomina e hiosciamina  ciertos medicamentos para el mal de Parkinson, tales como benzatropina y trihexifenidilo  ciertos medicamentos para convulsiones, tales como fenobarbital, primidona  anestsicos generales, tales como halotano, isoflurano, metoxiflurano, propofol  ipratropio  anestsicos locales, tales como lidocana, pramoxina, tetracana  medicamentos para relajar los msculos antes de una ciruga  fenotiazinas, tales como clorpromazina, mesoridazina, Government social research officer, tioridazina  medicamentos narcticos para Conservation officer, historic buildings  otros alcaloides de belladona Puede ser que esta lista no menciona todas las posibles interacciones. Informe a su profesional de KB Home	[place] de [employer] productos a base de hierbas, medicamentos de [place] o suplementos nutritivos que est tomando. Si usted fuma, consume bebidas alcohlicas o si utiliza drogas ilegales, indqueselo tambin a su profesional de KB Home	[place]. Algunas sustancias pueden interactuar con su medicamento. Puede ser que esta lista no menciona todas las posibles interacciones. Informe a su profesional de KB Home	[place] de [employer] Corporation productos a base de hierbas, medicamentos de [place] o suplementos nutritivos que est tomando. Si usted fuma, consume bebidas alcohlicas o si utiliza drogas ilegales, indqueselo tambin a su profesional de KB Home	[place]. Algunas sustancias pueden interactuar con su medicamento. A qu debo estar atento al usar Coca-Cola? Limite el contacto con el agua mientras nada y se baa porque el parche puede desprenderse. Si se desprende el parche, trelo y colquese uno nuevo detrs de la [place]. Puede experimentar somnolencia o mareos. No conduzca, no utilice maquinaria ni haga nada que Associate Professor en estado de alerta hasta que sepa cmo le afecta este medicamento. No se siente ni se ponga de  pie con rapidez, especialmente si es un paciente de edad avanzada. Esto reduce el riesgo de mareos o [employer]. El alcohol puede interferir con el efecto de este medicamento. Evite consumir bebidas alcohlicas. Se le podra secar la boca. Masticar chicle sin azcar, chupar caramelos duros y tomar agua en abundancia le ayudar a mantener la boca hmeda. Si el problema no desaparece o es severo, consulte a su profesional de KB Home	[place]. Este medicamento puede resecarle los ojos y provocar visin borrosa. Si [place] lentes de contacto, puede sentir ciertas molestias. Las gotas lubricantes pueden ser tiles. Si el problema no desaparece  o es severo, consulte a su profesional de Technical sales engineer. Si va a someterse a una operacin, a una Health visitor, a una tomografa computarizada, o a otro procedimiento, informe a su profesional de la salud que est usando este medicamento. Es posible que deba quitarse el parche antes del procedimiento. Qu efectos secundarios puedo tener al [employer] este medicamento? Efectos secundarios que debe informar a su mdico o a Barrister's clerk de la salud tan pronto como sea posible:  Chief of Staff, como erupcin cutnea, comezn/picazn o urticaria; hinchazn de la cara, los labios o la lengua  visin borrosa  cambios en la visin  confusin  mareos  dolor ocular  ritmo cardiaco rpido, irregular  alucinaciones, prdida del contacto con la realidad  nuseas, vmito  dolor o problemas al orinar  inquietud  convulsiones  irritacin de la piel  dolor estomacal Efectos secundarios que generalmente no requieren atencin mdica (infrmelos a su mdico o a Barrister's clerk de la salud si persisten o si son molestos):  somnolencia  boca seca  dolor de Film/video editor de garganta Puede ser que esta lista no menciona todos los posibles efectos secundarios. Comunquese a su mdico por asesoramiento mdico Humana Inc. Usted puede informar los  efectos secundarios a la FDA por telfono al 1-800-FDA-1088. Puede ser que esta lista no menciona todos los posibles efectos secundarios. Comunquese a su mdico por asesoramiento mdico Humana Inc. Usted puede informar los efectos secundarios a la FDA por telfono al 1-800-FDA-1088. Dnde debo guardar mi medicina? Mantenga fuera del alcance de los nios. Guarde a [employer], entre 20 y 74 grados Celsius (25 y 23 grados Fahrenheit). Mantenga este medicamento en el envase de aluminio hasta que est listo para usarlo. Deseche todo el medicamento que no haya utilizado despus de la fecha de vencimiento. ATENCIN: Este folleto es un resumen. Puede ser que no cubra toda la posible informacin. Si usted tiene preguntas acerca de esta medicina, consulte con su mdico, su farmacutico o su profesional de Technical sales engineer. ATENCIN: Este folleto es un resumen. Puede ser que no cubra toda la posible informacin. Si usted tiene preguntas acerca de esta medicina, consulte con su mdico, su farmacutico o su profesional de Technical sales engineer.  2021 Elsevier/Gold Standard (2017-07-25 00:00:00)       Izola Price general en adultos, cuidados posteriores General Anesthesia, Adult, Care After Esta hoja le brinda informacin sobre cmo cuidarse despus del procedimiento. El mdico tambin podr darle instrucciones ms especficas. Comunquese con el mdico si tiene problemas o preguntas. Qu puedo esperar despus del procedimiento? Luego del procedimiento, son comunes los siguiente efectos secundarios:  Dolor o Scientist, research (life sciences) en el lugar de la va intravenosa (i.v.).  Nuseas.  Vmitos.  El dolor de Investment banker, operational.  Dificultad para concentrarte.  Sentir fro o Cel[employer] Corporation.  Sensacin de debilidad o cansancio.  Somnolencia y Programmer, applications.  Malestar y Hydrologist. Estos efectos secundarios pueden afectar partes del cuerpo que no estuvieron involucradas en la ciruga. Siga estas instrucciones en  su casa: Durante el perodo de [employer] Energy le haya indicado el mdico:  Descanse.  No participe en actividades que impliquen posibles cadas o lesiones.  No conduzca ni opere maquinaria.  No beba alcohol.  No tome somnferos ni medicamentos que causen somnolencia.  No tome decisiones trascendentes ni firme documentos importantes.  No cuide a nios por su cuenta.   Comida y bebida  Siga las indicaciones del mdico respecto de las restricciones de comidas o bebidas.  Cuando tenga [place],  comience a comer cantidades pequeas de alimentos que sean blandos y fciles de digerir (livianos), como una tostada. Retome su dieta habitual de forma gradual.  Beber suficiente lquido como para mantener la orina de color amarillo plido.  Si vomita, rehidrtese tomando agua, jugo o caldo transparente. Instrucciones generales  Si tiene apnea del sueo, la [place] y ciertos medicamentos pueden elevar su riesgo de tener problemas respiratorios. Siga las instrucciones del mdico respecto al uso del dispositivo para dormir: ? Siempre que duerma, incluso durante las siestas que tome en el da. ? Mientras tome analgsicos recetados, medicamentos para dormir o medicamentos que producen somnolencia.  Pida a un adulto responsable que se quede con usted durante el tiempo que se le indique. Es importante tener a alguien que lo ayude a cuidarse hasta que est despierto y Press photographer.  Retome sus actividades normales segn lo indicado por el mdico. Pregntele al mdico qu actividades son seguras para usted.  Use los medicamentos de venta libre y los recetados solamente como se lo haya indicado el mdico.  Si fuma, no lo haga sin supervisin.  Concurra a todas las visitas de seguimiento como se lo haya indicado el mdico. Esto es importante. Comunquese con un mdico si:  Tiene nuseas o vmitos que no mejoran con medicamentos.  No puede comer ni beber sin vomitar.  Su dolor no se alivia con  medicamentos.  No puede orinar.  Tiene una erupcin cutnea.  Tiene fiebre.  Presenta enrojecimiento alrededor del lugar de la va intravenosa (i.v.) que empeora. Solicite ayuda de inmediato si:  Tienes dificultad para respirar.  Sientes dolor en el pecho.  Observa sangre en la orina o heces, o vomita sangre. Resumen  Despus del procedimiento, es comn tener dolor de garganta y nuseas. Tambin es comn sentirse cansado.  Pida a un adulto responsable que se quede con usted durante el tiempo que se le indique. Es importante tener a alguien que lo ayude a cuidarse hasta que est despierto y Press photographer.  Cuando [employer], comience a comer cantidades pequeas de alimentos que sean blandos y fciles de Publishing copy (livianos), como una tostada. Retome su dieta habitual de forma gradual.  Beber suficiente lquido como para mantener la orina de color amarillo plido.  Retome sus actividades normales segn lo indicado por el mdico. Pregntele al mdico qu actividades son seguras para usted. Esta informacin no tiene Marine scientist el consejo del mdico. Asegrese de hacerle al mdico cualquier pregunta que tenga. Document Revised: 09/05/2019 Document Reviewed: 09/05/2019 Elsevier Patient Education  [place].   Oxycodone Capsules or Tablets Sander Nephew es [place] medicamento? La OXICODONA es un analgsico. Se [place] para tratar el dolor moderado a severo. Este medicamento puede ser utilizado para otros usos; si tiene alguna pregunta consulte con su proveedor de atencin mdica o con su farmacutico. MARCAS COMUNES: Dazidox, Endocodone, Oxaydo, [place], OxyIR, Percolone, Roxicodone, Roxybond [employer] debo informar a mi profesional de la salud antes de tomar este medicamento? Necesitan saber si usted presenta alguno de los siguientes problemas o situaciones: enfermedad de [place] tumor cerebral lesin de la cabeza enfermedad cardiaca antecedentes de abuso de alcohol o drogas si bebe  alcohol con frecuencia enfermedad renal enfermedad heptica enfermedad pulmonar o respiratoria, como asma trastorno mental enfermedad pancretica convulsiones enfermedad tiroidea una reaccin inusual o alrgica a la oxicodona, codena, hidrocodona, morfina, a otros medicamentos, alimentos, colorantes o conservantes si est embarazada o buscando quedar embarazada si est amamantando a un beb Cmo debo utilizar este medicamento? [employer]  por va oral con un vaso de agua. Siga las instrucciones de la etiqueta del [place]. Puede tomarlo con o sin alimentos. Si el Transport planner, tmelo con alimentos. Tome su medicamento a intervalos regulares. No lo tome con una frecuencia mayor a la indicada. No deje de tomarlo, excepto si as lo indica su mdico. Algunas marcas de este medicamento, como [place], tienen instrucciones especiales. Consulte a su mdico o farmacutico si estas instrucciones son para usted: no corte, triture ni Mudlogger. Trague solo una tableta a Radiographer, therapeutic. No humedezca, moje, ni chupe la tableta antes de tomarla. Su farmacutico le dar una Gua del medicamento especial (MedGuide, nombre en ingls) con cada receta y en cada ocasin que la vuelva a surtir. Asegrese de leer esta informacin cada vez cuidadosamente. Hable con su pediatra para informarse acerca del uso de este medicamento en nios. Puede requerir atencin especial. Sobredosis: Pngase en contacto inmediatamente con un centro toxicolgico o una sala de urgencia si usted cree que haya tomado demasiado medicamento. ATENCIN: [employer] Foods es solo para usted. No comparta este medicamento con nadie. Qu sucede si me olvido de una dosis? Si olvida una dosis, tmela lo antes posible. Si es casi la hora de la prxima dosis, tome slo esa dosis. No tome dosis adicionales o dobles. Qu puede interactuar con este medicamento? Esta medicina puede interactuar con los siguientes  medicamentos: alcohol antihistamnicos para Buyer, retail, tos y resfro medicamentos antivirales para el VIH o SIDA atropina ciertos antibiticos, tales [place], eritromicina, linezolida, rifampicina ciertos medicamentos para la ansiedad o para conciliar el sueo ciertos medicamentos para problemas de vejiga, tales como oxibutinina, tolterodina ciertos medicamentos para la depresin, como amitriptilina, fluoxetina, sertralina ciertos medicamentos para infecciones micticas, tales como quetoconazol, itraconazol, voriconazol ciertos medicamentos para la migraa, tales como almotriptn, eletriptn, frovatriptn, naratriptn, rizatriptn, sumatriptn, zolmitriptn ciertos medicamentos para las nuseas o los vmitos, tales como dolasetrn, [place], Contractor ciertos medicamentos para el mal de Parkinson, tales como benzatropina, trihexifenidilo ciertos medicamentos para convulsiones, tales como fenobarbital, fenitona, primidona ciertos medicamentos para problemas estomacales, tales como diciclomina, hiosciamina ciertos medicamentos para el mareo por movimiento, tal como escopolamina diurticos anestsicos generales, tales como halotano, isoflurano, metoxiflurano, propofol ipratropio anestsicos locales, tales como lidocana, pramoxina, tetracana IMAO, tales como Carbex, Eldepryl, Marplan, Nardil y Parnate medicamentos para relajar los msculos antes de una ciruga azul de metileno nilotinib otros medicamentos narcticos para Conservation officer, historic buildings o la tos fenotiazinas, tales como Chief of Staff, Musician, Government social research officer, tioridazina Puede ser que esta lista no menciona todas las posibles interacciones. Informe a su profesional de KB Home	[place] de [employer] productos a base de hierbas, medicamentos de [place] o suplementos nutritivos que est tomando. Si usted fuma, consume bebidas alcohlicas o si utiliza drogas ilegales, indqueselo tambin a su profesional de KB Home	[place]. Algunas sustancias pueden interactuar con  su medicamento. A qu debo estar atento al usar Coca-Cola? Informe a su proveedor de atencin mdica si el dolor no desaparece, si empeora, o si tiene un dolor nuevo o diferente. Es posible que desarrolle tolerancia a este frmaco. Tolerancia significa que necesitar una dosis ms alta del frmaco para Best boy. La tolerancia es normal y previsible si [place] este frmaco por [employer]. Existen distintos tipos de frmacos narcticos (opioides) para Conservation officer, historic buildings. Si [place] ms de un tipo a la vez, es posible que tenga ms efectos secundarios. Entrguele a su proveedor de atencin mdica una lista de todos los frmacos que [place]. l o ella le dir qu  cantidad debe usar del frmaco. No use ms frmaco de lo indicado. Busque ayuda de emergencia de inmediato si tiene problemas para respirar. No deje de usar su frmaco repentinamente porque puede desarrollar una reaccin grave. Su cuerpo se acostumbra al frmaco. Esto NO significa que usted es [place]. La adiccin es una conducta relacionada a la obtencin y [place] de una droga por razones que no son mdicas. Si usted tiene Social research officer, government, tiene una razn mdica para usar el frmaco. Su proveedor de atencin mdica le dir cunto Customer service manager. Si su proveedor de atencin mdica desea que deje de Tax inspector, la dosis se ir disminuyendo lentamente durante un tiempo para Midwife secundario. Hable con su proveedor de atencin [employer] naloxona y cmo [place]. La naloxona es un frmaco de emergencia usado para tratar una sobredosis de opioides. Puede producirse una sobredosis si usted [place] demasiada cantidad de un opioide. Tambin puede suceder si se [place] un opioide con algunos otros frmacos o sustancias, tales como alcohol. Conozca los sntomas de una sobredosis, tales como dificultad para respirar, cansancio o somnolencia inusual, o no poder responder o despertarse. Asegrese de decirles a los cuidadores y a los contactos cercanos dnde  est guardada. Asegrese de que sepan cmo usarla. Despus de que se administra la naloxona, debe recibir ayuda de emergencia de inmediato. La naloxona es un tratamiento temporal. Es posible que se necesiten varias dosis. Puede experimentar somnolencia o mareos. No conduzca, no utilice maquinaria ni haga nada que Associate Professor en estado de alerta hasta que sepa cmo le afecta este frmaco. No se siente ni se ponga de pie con rapidez, especialmente si es un paciente de edad avanzada. Esto reduce el riesgo de mareos o [employer]. El alcohol puede interferir con el efecto de este frmaco. Evite consumir bebidas alcohlicas. Este frmaco causar estreimiento. Si no evacua los intestinos durante 3 das, llame a su proveedor de Geophysical data processor. Se le podra secar la boca. Masticar chicle sin azcar o chupar caramelos duros y beber agua en abundancia le ayudar a mantener la boca hmeda. Si el problema no desaparece o es severo, consulte a su proveedor de Geophysical data processor. El recubrimiento de la tableta de algunas marcas de este frmaco no se disuelve. Esto es normal. Es posible que encuentre el recubrimiento intacto de las tabletas en las heces. Esto no debe ser motivo de preocupacin. Qu efectos secundarios puedo tener al [employer] este medicamento? Efectos secundarios que debe informar a su mdico o a Barrister's clerk de la salud tan pronto como sea posible: Chief of Staff, como erupcin cutnea, comezn/picazn o urticarias, e hinchazn de la cara, los labios o la lengua problemas respiratorios confusin signos y sntomas de presin sangunea [place], tales como [place], sensacin de [place] o aturdimiento, cadas, cansancio o debilidad inusual dificultad para orinar o cambios en el volumen de orina problemas para tragar Efectos secundarios que generalmente no requieren atencin mdica (infrmelos a su mdico o a su profesional de la salud si persisten o si son molestos): estreimiento boca seca nuseas,  vmito cansancio Puede ser que esta lista no menciona todos los posibles efectos secundarios. Comunquese a su mdico por asesoramiento mdico [employer]. Usted puede informar los efectos secundarios a la FDA por telfono al 1-800-FDA-1088. Dnde debo guardar mi medicina? Mantenga fuera del alcance de los nios. Existe la posibilidad de abusar de Coca-Cola. Mantenga su medicamento en un lugar seguro para protegerlo contra robos. No comparta este medicamento con nadie. Es peligroso  vender o Associate Professor, y est prohibido por la ley. Guarde a [employer], entre 15 y 56 grados Celsius (30 y 31 grados Fahrenheit). Proteja de Naval architect. [place]. Este medicamento puede causar daos y la muerte si lo toman otros adultos, nios o Copy. Regrese el medicamento que no haya utilizado a Engineer, civil (consulting) oficial para desecharlo. Contacte a la DEA al [phone] o al gobierno de su ciudad/condado para Therapist, art. Si no puede devolver el medicamento, arrjelo por el sanitario. No utilice este medicamento despus de la fecha de vencimiento. ATENCIN: Este folleto es un resumen. Puede ser que no cubra toda la posible informacin. Si usted tiene preguntas acerca de esta medicina, consulte con su mdico, su farmacutico o su profesional de Technical sales engineer.  2021 Elsevier/Gold Standard (2019-01-16 00:00:00)    Ondansetron oral dissolving tablet Qu es este medicamento? El ONDANSETRN se [place] para tratar las nuseas y el vmito causados por la quimioterapia. Tambin se [place] para prevenir o tratar las nuseas y el vmito despus de una operacin. Este medicamento puede ser utilizado para otros usos; si tiene alguna pregunta consulte con su proveedor de atencin mdica o con su farmacutico. MARCAS COMUNES: Zofran ODT Qu le debo informar a mi profesional de la salud antes de tomar este medicamento? Necesita saber si usted presenta alguno de  los siguientes problemas o situaciones:  enfermedad cardiaca  antecedentes de pulso cardiaco irregular  enfermedad heptica  niveles bajos de magnesio o potasio en la sangre  una reaccin alrgica o inusual al ondansetrn, granisetrn, a otros medicamentos, alimentos, colorantes o conservantes  si est embarazada o buscando quedar embarazada  si est amamantando a un beb Cmo debo [employer]? Estas tabletas estn hechas para disolverse en la boca. No empuje la tableta a travs del envase de aluminio. Con las manos secas remueva la tableta con cuidado de su envase. Coloque la tableta en la boca y Mining engineer. Cuando se disuelva, ingirala. Estas tabletas se pueden tomar con agua, pero no es necesario que lo haga. Hable con su pediatra para informarse acerca del uso de este medicamento en nios. Puede requerir atencin especial. Sobredosis: Pngase en contacto inmediatamente con un centro toxicolgico o una sala de urgencia si usted cree que haya tomado demasiado medicamento. ATENCIN: [employer] es solo para usted. No comparta este medicamento con nadie. Qu sucede si me olvido de una dosis? Si olvida una dosis, tmela lo antes posible. Si es casi la hora de la prxima dosis, tome slo esa dosis. No tome dosis adicionales o dobles. Qu puede interactuar con este medicamento? No use este medicamento con ninguno de los siguientes frmacos: apomorfina ciertos medicamentos para infecciones micticas, tales como fluconazol, itraconazol, ketoconazol, posaconazol y voriconazol cisaprida dronedarona pimozida tioridazina Este medicamento tambin puede interactuar con los siguientes frmacos: carbamazepina ciertos medicamentos para la depresin, ansiedad o trastornos psicticos fentanilo linezolida IMAO, tales como [place], [place], [place], Nardil y Parnate azul de metileno (inyectado en una vena) otros medicamentos que prolongan el intervalo QT (causan un ritmo cardiaco  anormal) tales como dofetilida, ziprasidona fenitona rifampicina tramadol Puede ser que esta lista no menciona todas las posibles interacciones. Informe a su profesional de KB Home	[place] de [employer] productos a base de hierbas, medicamentos de [place] o suplementos nutritivos que est tomando. Si usted fuma, consume bebidas alcohlicas o si utiliza drogas ilegales, indqueselo tambin a su profesional de KB Home	[place]. Algunas sustancias pueden interactuar con su medicamento. A qu debo estar atento  al usar Coca-Cola? Si experimenta algn signo de reaccin alrgica, consulte a su mdico o a su profesional de KB Home	[place] lo antes posible. Qu efectos secundarios puedo tener al [employer] este medicamento? Efectos secundarios que debe informar a su mdico o a Barrister's clerk de la salud tan pronto como sea posible:  Chief of Staff como erupcin cutnea, picazn o urticarias, hinchazn de la cara, labios o lengua  problemas respiratorios  confusin  mareos  pulso cardiaco rpido o irregular  sensacin de desmayos o aturdimiento, cadas  fiebre y escalofros  prdida del equilibrio o coordinacin  convulsiones  sudoracin  hinchazn de las manos o pies  opresin en el pecho  temblores  cansancio o debilidad inusual Efectos secundarios que, por lo general, no requieren atencin mdica (debe informarlos a su mdico o a Barrister's clerk de la salud si persisten o si son molestos):  estreimiento o diarrea  dolor de cabeza Puede ser que esta lista no menciona todos los posibles efectos secundarios. Comunquese a su mdico por asesoramiento mdico [employer]. Usted puede informar los efectos secundarios a la FDA por telfono al 1-800-FDA-1088. Dnde debo guardar mi medicina? Mantngala fuera del alcance de los nios. [place] a una temperatura de [place] 2 y 36 grados C (24 y 79 grados F). Deseche todo el medicamento que no haya utilizado, despus de la fecha de  vencimiento. ATENCIN: Este folleto es un resumen. Puede ser que no cubra toda la posible informacin. Si usted tiene preguntas acerca de esta medicina, consulte con su mdico, su farmacutico o su profesional de Technical sales engineer.  2021 Elsevier/Gold Standard (2018-08-13 00:00:00)

## 2020-07-06 NOTE — Anesthesia Preprocedure Evaluation (Signed)
Anesthesia Evaluation  Patient identified by MRN, date of birth, ID band Patient awake    Reviewed: Allergy & Precautions, NPO status , Patient's Chart, lab work & pertinent test results  History of Anesthesia Complications (+) PONV and history of anesthetic complications  Airway Mallampati: II  TM Distance: >3 FB Neck ROM: Full    Dental  (+) Dental Advisory Given, Chipped   Pulmonary shortness of breath (chronic bronchitis, sinusitis ),    Pulmonary exam normal breath sounds clear to auscultation       Cardiovascular Exercise Tolerance: Good Normal cardiovascular exam Rhythm:Regular Rate:Normal     Neuro/Psych negative neurological ROS  negative psych ROS   GI/Hepatic Neg liver ROS, GERD (stopped taking medication 2 weeks ago)  Medicated and Controlled,  Endo/Other  negative endocrine ROS  Renal/GU negative Renal ROS     Musculoskeletal  (+) Arthritis  (back pain),   Abdominal   Peds  Hematology  (+) anemia ,   Anesthesia Other Findings   Reproductive/Obstetrics negative OB ROS                            Anesthesia Physical Anesthesia Plan  ASA: II  Anesthesia Plan: General   Post-op Pain Management:    Induction: Intravenous  PONV Risk Score and Plan: 4 or greater and Ondansetron, Dexamethasone, Midazolam and Scopolamine patch - Pre-op  Airway Management Planned: LMA  Additional Equipment:   Intra-op Plan:   Post-operative Plan: Extubation in OR  Informed Consent: I have reviewed the patients History and Physical, chart, labs and discussed the procedure including the risks, benefits and alternatives for the proposed anesthesia with the patient or authorized representative who has indicated his/her understanding and acceptance.     Dental advisory given  Plan Discussed with: CRNA and Surgeon  Anesthesia Plan Comments:         Anesthesia Quick Evaluation

## 2020-07-06 NOTE — Interval H&P Note (Signed)
History and Physical Interval Note:  07/06/2020 7:21 AM  Joyce Roberts  has presented today for surgery, with the diagnosis of intraductal papilloma.  The various methods of treatment have been discussed with the patient and family. After consideration of risks, benefits and other options for treatment, the patient has consented to  Procedure(s): EXCISIONAL BREAST BIOPSY WITH RADIOFREQUENCY TAG IDENTIFICATION (Left) as a surgical intervention.  The patient's history has been reviewed, patient examined, no change in status, stable for surgery.  I have reviewed the patient's chart and labs.  Questions were answered to the patient's satisfaction.    No recent discharge. Tag placed.  Virl Cagey

## 2020-07-07 ENCOUNTER — Encounter (HOSPITAL_COMMUNITY): Payer: Self-pay | Admitting: General Surgery

## 2020-07-07 ENCOUNTER — Other Ambulatory Visit: Payer: Self-pay

## 2020-07-07 DIAGNOSIS — K581 Irritable bowel syndrome with constipation: Secondary | ICD-10-CM

## 2020-07-07 DIAGNOSIS — K589 Irritable bowel syndrome without diarrhea: Secondary | ICD-10-CM

## 2020-07-07 NOTE — Telephone Encounter (Signed)
Call to speak with the pt's daughter Joyce Roberts and she was busy doing online classes and will call me back

## 2020-07-08 LAB — SURGICAL PATHOLOGY

## 2020-07-08 NOTE — Telephone Encounter (Signed)
Spoke with the daughter [name]) left lab forms up front for them to pick up today.

## 2020-07-16 ENCOUNTER — Ambulatory Visit (INDEPENDENT_AMBULATORY_CARE_PROVIDER_SITE_OTHER): Payer: Self-pay | Admitting: General Surgery

## 2020-07-16 ENCOUNTER — Other Ambulatory Visit: Payer: Self-pay

## 2020-07-16 ENCOUNTER — Encounter: Payer: Self-pay | Admitting: General Surgery

## 2020-07-16 VITALS — BP 127/73 | HR 88 | Temp 97.3°F | Resp 16 | Ht 60.0 in | Wt 150.0 lb

## 2020-07-16 DIAGNOSIS — D369 Benign neoplasm, unspecified site: Secondary | ICD-10-CM

## 2020-07-16 MED ORDER — GABAPENTIN 100 MG PO CAPS: 100.0000 mg | ORAL_CAPSULE | Freq: Three times a day (TID) | ORAL | 0 refills | Status: DC

## 2020-07-16 NOTE — Patient Instructions (Signed)
Instrucciones de gabapentina  Tomar 1 cpsula tres veces al da durante 2 semanas. Tome 1 cpsula en el almuerzo y en la tarde durante 1 semana. Tome 1 cpsula por la tarde durante 1 semana.   Mamografia en unas 6 semanas.  Alguien llamar con la cita para la mamografa y seguimiento con el Dr. Constance Roberts.    Gabapentin Instructions  Take 1 capsule three times a day for 2 weeks. Take 1 capsule at lunch and in the evening for 1 week. Take 1 capsule in the evening for 1 week.   Mammogram in about 6 weeks.  Someone will call with the appointment for the mammogram and follow-up with Dr. Constance Roberts.

## 2020-07-16 NOTE — Progress Notes (Signed)
[facility] Note   HPI:  50 y.o. Female presents to clinic for post-op follow-up evaluation after left breast excisional biopsy. The radiofrequency tag was dislodged from the specimen and the biopsy clip was not seen and was likely suctioned up. Additional margins were taken and the pathology has all been benign with only ductal hyperplasia.    Given the lack of the biopsy clip she will need a mammogram in a few weeks.  Interpretor used.   Review of Systems:  No erythema or drainage Tenderness  Burning pain/ tingling All other review of systems: otherwise negative   Vital Signs:  BP 127/73   Pulse 88   Temp (!) 97.3 F (36.3 C) (Other (Comment))   Resp 16   Ht 5' (1.524 m)   Wt 150 lb (68 kg)   LMP 07/03/2020   SpO2 98%   BMI 29.29 kg/m    Physical Exam:  Physical Exam Vitals reviewed.  Cardiovascular:     Rate and Rhythm: Normal rate.  Pulmonary:     Effort: Pulmonary effort is normal.  Chest:     Comments: Left breast incision c/d/i with no erythema or drainage, dermabond intact Neurological:     Mental Status: She is alert.     Assessment:  50 y.o. 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 Biopsy clip was not in the specimen and she will need a repeat Mammogram.   Plan:  Mammogram ordered for 6 weeks out Gabapentin 100 TID ordered for the neuropathic pain Take 1 capsule three times a day for 2 weeks. Take 1 capsule at lunch and in the evening for 1 week. Take 1 capsule in the evening for 1 week. Someone will call with the appointment for the mammogram and follow-up with Dr. Constance Haw.   Future Appointments  Date Time Provider [place]  07/28/2020  9:00 AM AP-CT 1 AP-CT [place] [facility]  08/05/2020 11:00 AM Mahala Menghini, PA-C [facility]  08/07/2020  2:15 PM McKenzie, Candee Furbish, MD [facility] None  08/10/2020 11:30 AM Soyla Dryer, PA-C [facility] None     Curlene Labrum,  MD [facility] [address] [place], [place] [address] [phone] (office)

## 2020-07-28 ENCOUNTER — Ambulatory Visit (HOSPITAL_COMMUNITY)
Admission: RE | Admit: 2020-07-28 | Discharge: 2020-07-28 | Disposition: A | Discharge status: Home / Self Care | Payer: Self-pay | Source: Ambulatory Visit | Attending: Urology | Admitting: Urology

## 2020-07-28 DIAGNOSIS — R310 Gross hematuria: Secondary | ICD-10-CM | POA: Insufficient documentation

## 2020-07-28 LAB — POCT I-STAT CREATININE: Creatinine, Ser: 0.7 mg/dL (ref 0.44–1.00)

## 2020-07-28 MED ORDER — IOHEXOL 300 MG/ML  SOLN
125.0000 mL | Freq: Once | INTRAMUSCULAR | Status: AC | PRN
  Administered 2020-07-28: 125 mL via INTRAVENOUS

## 2020-08-05 ENCOUNTER — Other Ambulatory Visit: Payer: Self-pay

## 2020-08-05 ENCOUNTER — Encounter: Payer: Self-pay | Admitting: Gastroenterology

## 2020-08-05 ENCOUNTER — Ambulatory Visit: Payer: PRIVATE HEALTH INSURANCE | Admitting: Internal Medicine

## 2020-08-05 ENCOUNTER — Other Ambulatory Visit (HOSPITAL_COMMUNITY)
Admission: RE | Admit: 2020-08-05 | Discharge: 2020-08-05 | Disposition: A | Discharge status: Home / Self Care | Payer: Self-pay | Source: Ambulatory Visit | Attending: Gastroenterology | Admitting: Gastroenterology

## 2020-08-05 ENCOUNTER — Ambulatory Visit (INDEPENDENT_AMBULATORY_CARE_PROVIDER_SITE_OTHER): Payer: Self-pay | Admitting: Gastroenterology

## 2020-08-05 VITALS — BP 128/84 | HR 94 | Temp 97.4°F | Ht 60.0 in | Wt 147.4 lb

## 2020-08-05 DIAGNOSIS — K581 Irritable bowel syndrome with constipation: Secondary | ICD-10-CM

## 2020-08-05 DIAGNOSIS — K920 Hematemesis: Secondary | ICD-10-CM | POA: Insufficient documentation

## 2020-08-05 DIAGNOSIS — K2970 Gastritis, unspecified, without bleeding: Secondary | ICD-10-CM

## 2020-08-05 DIAGNOSIS — K625 Hemorrhage of anus and rectum: Secondary | ICD-10-CM | POA: Insufficient documentation

## 2020-08-05 DIAGNOSIS — B9681 Helicobacter pylori [H. pylori] as the cause of diseases classified elsewhere: Secondary | ICD-10-CM | POA: Insufficient documentation

## 2020-08-05 DIAGNOSIS — R112 Nausea with vomiting, unspecified: Secondary | ICD-10-CM | POA: Insufficient documentation

## 2020-08-05 DIAGNOSIS — G8929 Other chronic pain: Secondary | ICD-10-CM

## 2020-08-05 DIAGNOSIS — R6881 Early satiety: Secondary | ICD-10-CM

## 2020-08-05 DIAGNOSIS — R1013 Epigastric pain: Secondary | ICD-10-CM | POA: Insufficient documentation

## 2020-08-05 LAB — HEPATIC FUNCTION PANEL
ALT: 30 U/L (ref 0–44)
AST: 26 U/L (ref 15–41)
Albumin: 4.5 g/dL (ref 3.5–5.0)
Alkaline Phosphatase: 73 U/L (ref 38–126)
Bilirubin, Direct: 0.1 mg/dL (ref 0.0–0.2)
Indirect Bilirubin: 0.5 mg/dL (ref 0.3–0.9)
Total Bilirubin: 0.6 mg/dL (ref 0.3–1.2)
Total Protein: 7.8 g/dL (ref 6.5–8.1)

## 2020-08-05 LAB — CBC
HCT: 36 % (ref 36.0–46.0)
Hemoglobin: 11.2 g/dL — ABNORMAL LOW (ref 12.0–15.0)
MCH: 24.6 pg — ABNORMAL LOW (ref 26.0–34.0)
MCHC: 31.1 g/dL (ref 30.0–36.0)
MCV: 79.1 fL — ABNORMAL LOW (ref 80.0–100.0)
Platelets: 278 10*3/uL (ref 150–400)
RBC: 4.55 MIL/uL (ref 3.87–5.11)
RDW: 15.7 % — ABNORMAL HIGH (ref 11.5–15.5)
WBC: 8.5 10*3/uL (ref 4.0–10.5)
nRBC: 0 % (ref 0.0–0.2)
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
## 2020-08-05 NOTE — Patient Instructions (Signed)
1. Please have labs done and collect stool for H.pylori testing. Consider completing at [facility]. 2. After collecting stool sample, start back on omeprazole once daily before breakfast.  3. Start miralax one capful once or twice daily as needed to keep stool regular and soft. 4. We will call and get your scheduled for an upper endoscopy and colonoscopy after you complete labs/stool.   Hgase anlisis y Designer, television/film set heces para la prueba de H. pylori. Considere completar en [employer]. Despus de Field seismologist la muestra de heces, comience nuevamente con omeprazol una vez al da antes del desayuno. Comience con una tapa de miralax una o dos veces al da, segn sea necesario, para mantener las heces regulares y blandas. Lo llamaremos y le programaremos una endoscopia digestiva alta y Ardelia Mems colonoscopia despus de que complete los anlisis de laboratorio/heces.

## 2020-08-07 ENCOUNTER — Ambulatory Visit (INDEPENDENT_AMBULATORY_CARE_PROVIDER_SITE_OTHER): Payer: PRIVATE HEALTH INSURANCE | Admitting: Urology

## 2020-08-07 ENCOUNTER — Encounter: Payer: Self-pay | Admitting: Urology

## 2020-08-07 ENCOUNTER — Other Ambulatory Visit: Payer: Self-pay

## 2020-08-07 VITALS — BP 136/86 | HR 73 | Temp 98.3°F | Ht 60.0 in | Wt 148.0 lb

## 2020-08-07 DIAGNOSIS — R310 Gross hematuria: Secondary | ICD-10-CM

## 2020-08-07 LAB — URINALYSIS, MICROSCOPIC ONLY: Renal Epithel, UA: NONE SEEN /hpf

## 2020-08-07 LAB — H. PYLORI ANTIGEN, STOOL: H. Pylori Stool Ag, Eia: POSITIVE — AB

## 2020-08-07 MED ORDER — CIPROFLOXACIN HCL 500 MG PO TABS
500.0000 mg | ORAL_TABLET | Freq: Once | ORAL | Status: AC
Start: 2020-08-07 — End: 2020-08-07
  Administered 2020-08-07: 500 mg via ORAL

## 2020-08-07 NOTE — Progress Notes (Signed)
Urological Symptom Review  Patient is experiencing the following symptoms: Frequent urination Get up at night to urinate Leakage of urine   Review of Systems  Gastrointestinal (upper)  : Negative for upper GI symptoms  Gastrointestinal (lower) : Negative for lower GI symptoms  Constitutional : Negative for symptoms  Skin: Negative for skin symptoms  Eyes: Negative for eye symptoms  Ear/Nose/Throat : Sinus problems  Hematologic/Lymphatic: Negative for Hematologic/Lymphatic symptoms  Cardiovascular : Leg swelling  Respiratory : Negative for respiratory symptoms  Endocrine: Negative for endocrine symptoms  Musculoskeletal: Back pain  Neurological: Headaches  Psychologic: Negative for psychiatric symptoms

## 2020-08-09 ENCOUNTER — Encounter: Payer: Self-pay | Admitting: Gastroenterology

## 2020-08-10 ENCOUNTER — Encounter: Payer: Self-pay | Admitting: Physician Assistant

## 2020-08-10 ENCOUNTER — Ambulatory Visit: Payer: Self-pay | Admitting: Physician Assistant

## 2020-08-10 ENCOUNTER — Other Ambulatory Visit: Payer: Self-pay

## 2020-08-10 VITALS — BP 143/89 | HR 85 | Temp 97.9°F | Wt 147.0 lb

## 2020-08-10 DIAGNOSIS — G4700 Insomnia, unspecified: Secondary | ICD-10-CM

## 2020-08-10 DIAGNOSIS — I10 Essential (primary) hypertension: Secondary | ICD-10-CM

## 2020-08-10 DIAGNOSIS — Z789 Other specified health status: Secondary | ICD-10-CM

## 2020-08-10 MED ORDER — LOSARTAN POTASSIUM 50 MG PO TABS: 50.0000 mg | ORAL_TABLET | Freq: Every day | ORAL | 0 refills | Status: DC

## 2020-08-10 NOTE — Patient Instructions (Signed)
Cmo sobrellevar una prdida en los adultos Managing Loss, Adult Las personas vivencian las prdidas de [place] a lo largo de sus vidas. Acontecimientos como una [place], un cambio de [place] y la prdida de amigos puede provocar una sensacin de prdida. La prdida puede ser tan grave como por ejemplo un cambio importante en la salud, un divorcio, la muerte de una mascota o la muerte de un ser querido. Es muy probable que todos estos tipos de prdida causen una reaccin fsica y emocional conocida como duelo. El duelo es el resultado de un cambio grande o la ausencia de algo o alguien que era muy importante para usted. El duelo es una reaccin normal ante la prdida. Diferentes factores pueden afectar su experiencia de duelo, por ejemplo:  La naturaleza de la prdida.  La relacin que tena con la persona que perdi o con lo que perdi.  Su comprensin del duelo y cmo sobrellevarlo.  Su red de contencin. Cmo manejar los cambios en el estilo de vida Mantenga su rutina habitual tanto como sea posible.  Si tiene dificultad para concentrarse o [place] habituales, est bien que se tome un tiempo fuera de [place] rutina.  Pase tiempo con amigos y seres queridos.  Siga una dieta saludable, duerma mucho y descanse cuando se sienta cansado.   Cmo reconocer los cambios  La manera en que maneje el duelo afectar su capacidad de funcionar como lo hace normalmente. Cuando [employer], puede experimentar estos cambios:  Statistician, choque, tristeza, ansiedad, enojo, negacin y [place].  Pensamientos sobre la [place].  Llanto repentino.  Una sensacin fsica de vaco en el estmago.  Problemas para dormir y Scientist, research (physical sciences).  Cansancio (fatiga).  Prdida de inters en actividades normales.  Soar o Advertising copywriter a la persona que muri.  Una necesidad de recordar a la persona que perdi o lo que perdi.  Dificultad para pensar en otra cosa que no sea la prdida durante  un tiempo.  Alivio. Si ha estado esperando la prdida durante un [place], es probable que sienta alivio cuando suceda. Siga estas instrucciones en su casa: Actividad Exprese sus sentimientos de [employer], como por ejemplo:  Hable con otras personas sobre su prdida. Puede ser beneficioso encontrar otras personas que hayan tenido [place] prdida similar, como un grupo de apoyo.  Anote sus sentimientos en un diario.  Realice actividad fsica para liberar el estrs y la energa emocional.  Sherilyn Cooter actividades creativas como pintura, escultura o tocar un instrumento o Conservation officer, nature.  Practique la resiliencia. Es la capacidad de reponerse y adaptarse despus de superar un obstculo. La lectura de algunas fuentes que incentiven a la resiliencia puede ser de ayuda para aprender las maneras de Paramedic.   Instrucciones generales  Sea paciente con usted mismo y con los dems. Deje que el proceso de duelo transcurra y recuerde que [place]. ? Algunos tipos de duelo pueden hacer que sienta que no se termina nunca. Puede encontrar Kallie Edward de seguir adelante sin olvidar ni dejar de tener sentimientos de aprecio por aquello que perdi. ? Aceptar la prdida es un [place]. Adaptarse puede llevar meses o ms tiempo.  Concurra a todas las visitas de seguimiento como se lo haya indicado el mdico. Esto es importante. Dnde encontrar apoyo Para obtener apoyo para sobrellevar la prdida:  Pida al mdico ayuda y recomendaciones, como asesoramiento o terapia para el duelo.  Piense en la posibilidad de unirse a un grupo de apoyo para personas que estn  sobrellevando una prdida. Dnde buscar ms informacin Puede encontrar ms informacin sobre cmo sobrellevar una prdida en:  Radio producer (Sociedad Estadounidense de Oncologa Clnica): www.cancer.net  American Psychological Association (Asociacin Estadounidense de Psicologa): TVStereos.ch Comunquese con  un mdico si:  El duelo es intenso y [place].  El dolor es constante y no mejora.  Es duelo comienza a Photographer su organismo, por ejemplo, se enferma.  Se siente deprimido, ansioso o solo. Solicite ayuda inmediatamente si:  Tiene pensamientos acerca de lastimarse a usted mismo o a Producer, television/film/video. Si alguna vez siente que puede lastimarse a usted mismo o a Producer, television/film/video, o tiene pensamientos de poner fin a su vida, busque ayuda de inmediato. Puede dirigirse al servicio de emergencias ms cercano o comunicarse con:  Servicio de emergencias de su localidad (911 en EE.UU.).  Una lnea de asistencia al suicida y Freight forwarder en crisis, como National Suicide Prevention Lifeline ([place]), al 670-243-4110. Est disponible las 24 horas del da. Resumen  El duelo es el resultado de un cambio grande o la ausencia de algo o alguien que era muy importante para usted. El duelo es una reaccin normal ante la prdida.  Lo profundo del duelo y el perodo de recuperacin dependen del tipo de prdida as como de la capacidad para adaptarse al cambio y Scientist, forensic los sentimientos.  Procesar el duelo requiere ser paciente y estar dispuesto a aceptar sus sentimientos, y Furniture conservator/restorer la prdida con personas que brindan contencin.  Es Patent examiner los recursos adecuados para usted y Pharmacologist que cada persona vive el duelo de [place]. No existe un mismo proceso de duelo que suceda de la misma [place] para todas [employer].  Tenga en cuenta que cuando el duelo se torna intenso, puede ocasionar problemas ms graves Road Runner, depresin, ansiedad o pensamientos suicidas. Hable con el mdico si tiene algunos de [employer]. Esta informacin no tiene Marine scientist el consejo del mdico. Asegrese de hacerle al mdico cualquier pregunta que tenga. Document Revised: 09/10/2019 Document Reviewed: 09/10/2019 Elsevier Patient Education  2021 [employer].

## 2020-08-10 NOTE — Progress Notes (Signed)
Cc'ed to pcp °

## 2020-08-10 NOTE — Progress Notes (Signed)
BP (!) 143/89   Pulse 85   Temp 97.9 F (36.6 C)   Wt 147 lb (66.7 kg)   SpO2 97%   BMI 28.71 kg/m    Subjective:    Patient ID: Joyce Roberts, female    DOB: 02-02-1971, 50 y.o.   MRN: [id number]  HPI: Joyce Roberts is a 50 y.o. female presenting on 08/10/2020 for No chief complaint on file.   HPI   Pt had a negative covid 19 screening questionnaire.    Pt is 52yoF who presents for recheck HTN.  Pt reports not sleeping well for about 2 weeks ago due to recent death in the family.  She denies depression, just sad.    Pt did not bring her meds with her and doesn't know what she is taking.   Pt is seeing GI for abdominal pain and constipation.    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    Pt is seeing urology for urinary frequency and hematuria.     Relevant past medical, surgical, family and social history reviewed and updated as indicated. Interim medical history since our last visit reviewed. Allergies and medications reviewed and updated.   Current Outpatient Medications:  .  acetaminophen (TYLENOL) 500 MG tablet, Take 1,000 mg by mouth every 6 (six) hours as needed for moderate pain., Disp: , Rfl:  .  cetirizine (ZYRTEC) 10 MG tablet, Take 1 tablet (10 mg total) by mouth daily as needed for allergies. Tome una tableta por boca diaria cuando sea necesario para las alergias, Disp: 90 tablet, Rfl: 0 .  fluticasone (FLONASE) 50 MCG/ACT nasal spray, Place 2 sprays into both nostrils daily. pongase 2 sprays en los dos oyos de la [place], Disp: 16 g, Rfl: 0 .  gabapentin (NEURONTIN) 100 MG capsule, Take 1 capsule (100 mg total) by mouth 3 (three) times daily. Take 1 capsule three times a day for 2 weeks. Take 1 capsule at lunch and PM for 1 week. Take 1 capsule in the PM for 1 week. [Tomar 1 c psula tres veces al d a durante 2 semanas. Tome 1 c psula en el almuerzo y en la tarde durante 1 semana. Tome 1 c psula por  la tarde durante 1 semana.], Disp: 90 capsule, Rfl: 0 .  ibuprofen (ADVIL) 200 MG tablet, Take 200 mg by mouth every 6 (six) hours as needed for moderate pain., Disp: , Rfl:  .  losartan (COZAAR) 25 MG tablet, Take 1 tablet (25 mg total) by mouth daily. Tome una tableta por boca diaria, Disp: 90 tablet, Rfl: 0 .  mirabegron ER (MYRBETRIQ) 25 MG TB24 tablet, Take 1 tablet (25 mg total) by mouth daily., Disp: 30 tablet, Rfl: 0 .  omeprazole (PRILOSEC) 20 MG capsule, 1 PO BID. 1 POR BOCA DOS VECES AL DIA (Patient not taking: No sig reported), Disp: 60 capsule, Rfl: 11    Review of Systems  Per HPI unless specifically indicated above     Objective:    BP (!) 143/89   Pulse 85   Temp 97.9 F (36.6 C)   Wt 147 lb (66.7 kg)   SpO2 97%   BMI 28.71 kg/m   Wt Readings from Last 3 Encounters:  08/10/20 147 lb (66.7 kg)  08/07/20 148 lb (67.1 kg)  08/05/20 147 lb 6.4 oz (66.9 kg)    Physical Exam Vitals reviewed.  Constitutional:      General: She is not in  acute distress.    Appearance: She is well-developed. She is not toxic-appearing.  HENT:     Head: Normocephalic and atraumatic.  Cardiovascular:     Rate and Rhythm: Normal rate and regular rhythm.  Pulmonary:     Effort: Pulmonary effort is normal.     Breath sounds: Normal breath sounds.  Abdominal:     General: Bowel sounds are normal.     Palpations: Abdomen is soft. There is no mass.     Tenderness: There is no abdominal tenderness.  Musculoskeletal:     Cervical back: Neck supple.     Right lower leg: No edema.     Left lower leg: No edema.  Lymphadenopathy:     Cervical: No cervical adenopathy.  Skin:    General: Skin is warm and dry.  Neurological:     Mental Status: She is alert and oriented to person, place, and time.  Psychiatric:        Behavior: Behavior normal.               Assessment & Plan:    Encounter Diagnoses  Name Primary?  . Primary hypertension Yes  . Insomnia, unspecified type    . Non-English speaking patient       -Increase losartan to 50mg  -Pt asked again to bring her meds to her appointment -Pt counseled on grief management and was given handout.  She can try some OTC melatonin if needed for insomnia. -F/u 1 mo to recheck bp

## 2020-08-25 ENCOUNTER — Encounter: Payer: Self-pay | Admitting: Urology

## 2020-08-25 NOTE — Progress Notes (Signed)
    CC: gross hematuria  HPI: Joyce Roberts is a 50yo here for followup for gross hematuria. CT hematuria workup showed no GU abnormalities Blood pressure 136/86, pulse 73, temperature 98.3 F (36.8 C), temperature source Oral, height 5' (1.524 m), weight 148 lb (67.1 kg). NED. A&Ox3.   No respiratory distress   Abd soft, NT, ND Normal external genitalia with patent urethral meatus  Cystoscopy Procedure Note  Patient identification was confirmed, informed consent was obtained, and patient was prepped using Betadine solution.  Lidocaine jelly was administered per urethral meatus.    Procedure: - Flexible cystoscope introduced, without any difficulty.   - Thorough search of the bladder revealed:    normal urethral meatus    normal urothelium    no stones    no ulcers     no tumors    no urethral polyps    no trabeculation  - Ureteral orifices were normal in position and appearance.  Post-Procedure: - Patient tolerated the procedure well  Assessment/ Plan:    Return in about 6 months (around 02/07/2021) for UA.  Nicolette Bang, MD\

## 2020-08-25 NOTE — Patient Instructions (Signed)
Hematuria en adultos Hematuria, Adult La hematuria es la presencia de sangre en la orina. La sangre puede ser visible en la orina o se puede identificar con un anlisis. La causa de esta afeccin puede ser infecciones de la vejiga, la uretra, el rin o la prstata. Otras causas posibles incluyen lo siguiente:  Clculos renales.  Cncer de las vas urinarias.  Exceso de calcio en la orina.  Enfermedades que se transmiten de padres a hijos (enfermedades hereditarias).  Ejercicios que Dealer. Normalmente, las infecciones pueden tratarse con medicamentos, y los clculos renales, por lo general, se eliminarn a travs de la orina. Si ninguna de estas es la causa de la hematuria, quizs sea Chartered loss adjuster ms estudios para identificar la causa de los sntomas que presenta. Es muy importante que le informe a su mdico si ve sangre en la orina, incluso si no tiene dolor o si el sangrado desaparece sin tratamiento. La sangre en la orina, que desaparece y vuelve a Arts administrator, puede ser un sntoma de una enfermedad muy grave, incluido Science writer. No se manifiesta dolor en las etapas iniciales de muchos tipos de cncer urinarios. Siga estas instrucciones en su casa: Medicamentos  Use los medicamentos de venta libre y los recetados solamente como se lo haya indicado el mdico.  Si le recetaron un antibitico, tmelo como se lo haya indicado el mdico. No deje de tomar el antibitico aunque comience a sentirse mejor. Comida y bebida  Beba suficiente lquido como para Theatre manager la orina de color amarillo plido. Se recomienda que beba de 3a4cuartos de galn (de 2.8a3.8l) por da. Si le han diagnosticado una infeccin, se recomienda beber jugo de Ono, adems de grandes cantidades de agua.  Evite la cafena, el t y las bebidas gaseosas. Estas sustancias tienden a Medical illustrator.  Evite el alcohol, ya que Advertising account planner prstata (en los hombres). Indicaciones  generales  Si le han diagnosticado clculos renales, siga las instrucciones de su mdico con respecto a filtrar la orina para rescatar el clculo.  Vace la vejiga con frecuencia. Evite retener la orina durante largos perodos.  Si es mujer: ? Despus de una deposicin, higiencese desde adelante hacia atrs y use cada trozo de papel higinico por nica vez. ? Vace la vejiga antes y despus de Clinical biochemist.  Est atento a cualquier cambio en los sntomas. Informe al mdico sobre cualquier cambio o sntoma nuevo.  Recoger los [employer] de las pruebas es su responsabilidad. Consulte al mdico o pregunte en el departamento donde se realiza la prueba cundo estarn [employer].  Cumpla con todas las visitas de seguimiento. Esto es importante. Comunquese con un mdico si:  Siente dolor en la espalda.  Tiene fiebre o escalofros.  Tiene nuseas o vmitos.  Los sntomas no mejoran despus de 3das.  Los sntomas empeoran. Solicite ayuda de inmediato si:  Presenta muchos vmitos y no puede tomar los medicamentos sin vomitar.  Siente dolor intenso en la espalda o el abdomen, a pesar de [employer].  Tiene una gran cantidad de sangre en la orina.  Despide cogulos de [employer].  Se siente muy dbil o como si fuera a desmayarse.  Se desmaya. Resumen  La hematuria es la presencia de sangre en la orina. Puede tener muchas causas posibles.  Es muy importante que le informe a su mdico si ve sangre en la orina, incluso si no tiene dolor o si el sangrado desaparece sin tratamiento.  Use  los medicamentos de venta libre y los recetados solamente como se lo haya indicado el mdico.  Beba suficiente lquido como para Theatre manager la orina de color amarillo plido. Esta informacin no tiene Marine scientist el consejo del mdico. Asegrese de hacerle al mdico cualquier pregunta que tenga. Document Revised: 01/13/2020 Document  Reviewed: 12/02/2019 Elsevier Patient Education  2021 [employer].

## 2020-09-01 ENCOUNTER — Ambulatory Visit (HOSPITAL_COMMUNITY): Admission: RE | Admit: 2020-09-01 | Payer: PRIVATE HEALTH INSURANCE | Source: Ambulatory Visit

## 2020-09-03 ENCOUNTER — Telehealth: Payer: Self-pay | Admitting: Internal Medicine

## 2020-09-03 NOTE — Telephone Encounter (Signed)
Pt's daughter, [name], called for patient asking about scheduling patient's colonoscopy/EGD. Please advise [phone] Pt had OV last month.

## 2020-09-04 NOTE — Telephone Encounter (Signed)
Per last OV note: We will call and get your scheduled for an upper endoscopy and colonoscopy after you complete labs/stool.  Magda Paganini, please advise if procedure can be scheduled. She has lab/stool results.

## 2020-09-08 ENCOUNTER — Ambulatory Visit: Payer: Self-pay | Admitting: General Surgery

## 2020-09-09 NOTE — Telephone Encounter (Signed)
Noted  

## 2020-09-09 NOTE — Telephone Encounter (Signed)
  Recent H.pylori stool antigen positive again.   She has a history of refractory H. pylori gastritis, has completed triple drug therapy three times. First regimen included amoxicillin, biaxin, and omeprazole. Second regimen included amoxicillin, levaquin, and dexilant. She has h/o itching and hives to doxycycline and tetracycline do unable to take bismuth quadruple treatment.  Third regimen included Rifabutin, amoxicillin, and prilosec.  Only other salvage regimen of available is high-dose dual therapy.  PPI (high dose) Twice daily 14 days  Amoxicillin (1 gram three times daily or 750 mg four times daily) Three to four times daily     I need to see if H.pylori culture and susceptibility are available, Dr. Oneida Alar had planned to do this several years ago but the lab we used no longer perform the test.  Previously referred patient to [facility] for the same but she never went likely due to being uninsured.  I will discuss further with Dr. Abbey Chatters.  Once this is determined, we will proceed with EGD and colonoscopy as appropriate.

## 2020-09-16 ENCOUNTER — Encounter: Payer: Self-pay | Admitting: *Deleted

## 2020-09-16 NOTE — Progress Notes (Signed)
Letter mailed

## 2020-09-21 ENCOUNTER — Ambulatory Visit: Payer: Self-pay | Admitting: Physician Assistant

## 2020-09-21 ENCOUNTER — Encounter: Payer: Self-pay | Admitting: Physician Assistant

## 2020-09-21 VITALS — BP 132/91 | HR 81 | Temp 98.2°F | Wt 149.0 lb

## 2020-09-21 DIAGNOSIS — Z789 Other specified health status: Secondary | ICD-10-CM

## 2020-09-21 DIAGNOSIS — I10 Essential (primary) hypertension: Secondary | ICD-10-CM

## 2020-09-21 MED ORDER — CETIRIZINE HCL 10 MG PO TABS: 10.0000 mg | ORAL_TABLET | Freq: Every day | ORAL | 0 refills | Status: DC | PRN

## 2020-09-21 MED ORDER — OMEPRAZOLE 20 MG PO CPDR: DELAYED_RELEASE_CAPSULE | ORAL | 0 refills | Status: DC

## 2020-09-21 MED ORDER — FLUTICASONE PROPIONATE 50 MCG/ACT NA SUSP: 2.0000 | Freq: Every day | NASAL | 0 refills | Status: DC

## 2020-09-21 MED ORDER — LOSARTAN POTASSIUM 50 MG PO TABS: 50.0000 mg | ORAL_TABLET | Freq: Every day | ORAL | 0 refills | Status: DC

## 2020-09-21 NOTE — Progress Notes (Signed)
BP (!) 132/91   Pulse 81   Temp 98.2 F (36.8 C)   Wt 149 lb (67.6 kg)   SpO2 98%   BMI 29.10 kg/m    Subjective:    Patient ID: Joyce Roberts, female    DOB: [birth year], 50 y.o.   MRN: [id number]  HPI: Joyce Roberts is a 50 y.o. female presenting on 09/21/2020 for No chief complaint on file.   HPI     Pt had a negative covid 19 screening questionnaire.      Pt is 6yoF who presents for routine follow up of HTN.  Pt seen by urology rencently for hematuria with f/u in 6 months.    She is followed by GI for abdominal pain.  Recent recurrent h pylori.  Pt was seen by surgery for breast problem/had excisional biopsy in April          Relevant past medical, surgical, family and social history reviewed and updated as indicated. Interim medical history since our last visit reviewed. Allergies and medications reviewed and updated.   Current Outpatient Medications:    acetaminophen (TYLENOL) 500 MG tablet, Take 1,000 mg by mouth every 6 (six) hours as needed for moderate pain., Disp: , Rfl:    cetirizine (ZYRTEC) 10 MG tablet, Take 1 tablet (10 mg total) by mouth daily as needed for allergies. Tome una tableta por boca diaria cuando sea necesario para las alergias, Disp: 90 tablet, Rfl: 0   fluticasone (FLONASE) 50 MCG/ACT nasal spray, Place 2 sprays into both nostrils daily. pongase 2 sprays en los dos oyos de la [place], Disp: 16 g, Rfl: 0   gabapentin (NEURONTIN) 100 MG capsule, Take 1 capsule (100 mg total) by mouth 3 (three) times daily. Take 1 capsule three times a day for 2 weeks. Take 1 capsule at lunch and PM for 1 week. Take 1 capsule in the PM for 1 week. [Tomar 1 c psula tres veces al d a durante 2 semanas. Tome 1 c psula en el almuerzo y en la tarde durante 1 semana. Tome 1 c psula por la tarde durante 1 semana.], Disp: 90 capsule, Rfl: 0   ibuprofen (ADVIL) 200 MG tablet, Take 200 mg by mouth every 6 (six) hours as needed for moderate pain., Disp: , Rfl:     losartan (COZAAR) 50 MG tablet, Take 1 tablet (50 mg total) by mouth daily. Tome una tableta por boca diaria, Disp: 90 tablet, Rfl: 0   mirabegron ER (MYRBETRIQ) 25 MG TB24 tablet, Take 1 tablet (25 mg total) by mouth daily., Disp: 30 tablet, Rfl: 0   omeprazole (PRILOSEC) 20 MG capsule, 1 PO BID. 1 POR BOCA DOS VECES AL DIA, Disp: 60 capsule, Rfl: 11   Review of Systems  Per HPI unless specifically indicated above     Objective:    BP (!) 132/91   Pulse 81   Temp 98.2 F (36.8 C)   Wt 149 lb (67.6 kg)   SpO2 98%   BMI 29.10 kg/m   Wt Readings from Last 3 Encounters:  09/21/20 149 lb (67.6 kg)  08/10/20 147 lb (66.7 kg)  08/07/20 148 lb (67.1 kg)    Physical Exam Vitals reviewed.  Constitutional:      General: She is not in acute distress.    Appearance: She is well-developed. She is not toxic-appearing.  HENT:     Head: Normocephalic and atraumatic.  Cardiovascular:     Rate and Rhythm: Normal rate and regular rhythm.  Pulmonary:     Effort: Pulmonary effort is normal.     Breath sounds: Normal breath sounds.  Abdominal:     General: Bowel sounds are normal.     Palpations: Abdomen is soft. There is no mass.     Tenderness: There is no abdominal tenderness.  Musculoskeletal:     Cervical back: Neck supple.     Right lower leg: No edema.     Left lower leg: No edema.  Lymphadenopathy:     Cervical: No cervical adenopathy.  Skin:    General: Skin is warm and dry.  Neurological:     Mental Status: She is alert and oriented to person, place, and time.  Psychiatric:        Behavior: Behavior is cooperative.           Assessment & Plan:    Encounter Diagnoses  Name Primary?   Primary hypertension Yes   Non-English speaking patient       -BP improved -Pt to continue current medications -Pt to continue with all her specialists per their recommendations -Pt to follow up here 3 months  she is to contact office sooner prn

## 2020-09-21 NOTE — Telephone Encounter (Signed)
Discussed with Dr. Abbey Chatters last week, he stated he would check with pathology about H.pylori culture and sensitivity test capabilities. Not sure if he did.   I called [facility]ology today. Testing is not offered in house but could possibly send out if we could find capable lab. [facility] apparently does testing.    Please follow up with [facility] pathology and make sure they will coordinate sending specimen to [facility] clinic lab for H.pylori culture and sensitivity testing (Test ID HELIS H.pylori culture with antimicrobial susceptibility testing). Patient has failed h.pylori treatment numerous times.  we will need to know how to collect specimen/what kind of transport container etc once information obtained, please arrange for colonoscopy and egd with [facility]. ASA II. Dx: refractory h.pylori, microcytic anemia, abdominal pain, early satiety. Please put in comments "n/v with general anesthesia but does well with scopolamine patch". Please also notate somewhere obvious that she is to get sensitivity testing for h.pylori.

## 2020-10-01 NOTE — Telephone Encounter (Signed)
[facility] clinical, please contact [facility] Pathology as outlined below regarding number 1 and 2. Thanks.

## 2020-10-01 NOTE — Telephone Encounter (Signed)
Niece called office, informed of Leslie's recommendation and needing to find out about H. Pylori testing.  Magda Paganini, can procedure be scheduled?

## 2020-10-06 ENCOUNTER — Ambulatory Visit (HOSPITAL_COMMUNITY)
Admission: RE | Admit: 2020-10-06 | Discharge: 2020-10-06 | Disposition: A | Discharge status: Home / Self Care | Payer: PRIVATE HEALTH INSURANCE | Source: Ambulatory Visit | Attending: General Surgery | Admitting: General Surgery

## 2020-10-06 ENCOUNTER — Other Ambulatory Visit: Payer: Self-pay

## 2020-10-06 DIAGNOSIS — D369 Benign neoplasm, unspecified site: Secondary | ICD-10-CM | POA: Diagnosis not present

## 2020-10-07 NOTE — Telephone Encounter (Signed)
Message was sent to [facility] at endo. She has contacted [facility] to assist with specimen collection advice.

## 2020-10-09 NOTE — Telephone Encounter (Signed)
Received message from [place], ok to set-up procedure. Endo received info for specimen collection. Pt will need to be scheduled for early procedure.  Tried to call pt's niece to schedule procedure, no answer, unable to leave message d/t no voicemail set-up.

## 2020-10-13 ENCOUNTER — Ambulatory Visit (INDEPENDENT_AMBULATORY_CARE_PROVIDER_SITE_OTHER): Payer: Self-pay | Admitting: General Surgery

## 2020-10-13 ENCOUNTER — Encounter: Payer: Self-pay | Admitting: General Surgery

## 2020-10-13 ENCOUNTER — Other Ambulatory Visit: Payer: Self-pay

## 2020-10-13 VITALS — BP 130/85 | HR 73 | Temp 98.5°F | Resp 12 | Ht 60.0 in | Wt 149.0 lb

## 2020-10-13 DIAGNOSIS — D369 Benign neoplasm, unspecified site: Secondary | ICD-10-CM

## 2020-10-13 NOTE — Telephone Encounter (Signed)
Tried to call pt's niece to schedule procedure, no answer, unable to leave message d/t no voicemail set-up.

## 2020-10-13 NOTE — Patient Instructions (Signed)
Continue to get annual mammograms as scheduled. Next is due in December.  Call with questions or concerns. Pathology on the excision was all benign. Mammogram in July looks good without concerning areas.  Clip will remain in place from prior biopsy.   Contine hacindose las mamografas anuales segn lo programado. El prximo vence en diciembre. Llame con preguntas o inquietudes. La patologa de la escisin fue toda benigna. La mamografa en julio se ve bien sin reas preocupantes. 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

## 2020-10-13 NOTE — Telephone Encounter (Signed)
Letter mailed to pt.  Joyce Roberts at endo will need to be notified when procedure is scheduled.

## 2020-10-15 NOTE — Progress Notes (Signed)
[facility] Note   HPI:  50 y.o. Female presents to clinic for post-op follow-up evaluation after her left breast excisional biopsy. She had her post op mammogram given the biopsy clip not being in the mammogram.    Discussed the mammogram and biopsy clip with Dr. Mena Pauls, she agrees all is benign and no indication for further excisional biopsy.   An interpretor was present.   Review of Systems:  No drainage No fevers Burning  All other review of systems: otherwise negative   Vital Signs:  BP 130/85   Pulse 73   Temp 98.5 F (36.9 C) (Other (Comment))   Resp 12   Ht 5' (1.524 m)   Wt 149 lb (67.6 kg)   SpO2 98%   BMI 29.10 kg/m    Physical Exam:  Physical Exam Vitals reviewed.  Cardiovascular:     Rate and Rhythm: Normal rate.  Neurological:     Mental Status: She is alert.    CLINICAL DATA:  50 year old female with recent left breast excision for intraductal papilloma at site of wing shaped biopsy marking clip. At time of patient's surgical excision 07/06/2020 the RF tag fell out of the biopsy cavity/was inadvertently suctioned. Patient presents today to evaluate for any residual clips in the left breast.   EXAM: DIGITAL DIAGNOSTIC UNILATERAL LEFT MAMMOGRAM WITH TOMOSYNTHESIS AND CAD   TECHNIQUE: Left digital diagnostic mammography and breast tomosynthesis was performed. The images were evaluated with computer-aided detection.   COMPARISON:  Previous exams.   ACR Breast Density Category c: The breast tissue is heterogeneously dense, which may obscure small masses.   FINDINGS: No suspicious masses or calcifications seen in the left breast. New postsurgical changes are present in the retroareolar left breast related to interval benign excision. The wing shaped biopsy marking clip at site of previously biopsied papilloma as well as the ribbon shaped biopsy marking clip at site of prior benign cyst biopsy are present.   IMPRESSION: 1.   Wing shaped biopsy marking clip remains in the left breast.   2.  No mammographic evidence of malignancy.   RECOMMENDATION: Recommend annual routine screening mammography due December 2022.   I have discussed the findings and recommendations with the patient. If applicable, a reminder letter will be sent to the patient regarding the next appointment.   BI-RADS CATEGORY  2: Benign.     Electronically Signed   By: Everlean Alstrom M.D.   On: 10/06/2020 16:14 Assessment:  50 y.o. yo Female with benign breast disease after excision. Doing well overall.  Plan:  Continue to get annual mammograms as scheduled. Next is due in December.  Call with questions or concerns. Pathology on the excision was all benign. Mammogram in July looks good without concerning areas.  Clip will remain in place from prior biopsy.   PRN follow up  Curlene Labrum, MD [facility] [address] [place], [place] [address] [phone] (office)

## 2020-10-19 ENCOUNTER — Other Ambulatory Visit: Payer: Self-pay

## 2020-10-19 DIAGNOSIS — R109 Unspecified abdominal pain: Secondary | ICD-10-CM

## 2020-10-19 DIAGNOSIS — K2970 Gastritis, unspecified, without bleeding: Secondary | ICD-10-CM

## 2020-10-19 DIAGNOSIS — R6881 Early satiety: Secondary | ICD-10-CM

## 2020-10-19 DIAGNOSIS — B9681 Helicobacter pylori [H. pylori] as the cause of diseases classified elsewhere: Secondary | ICD-10-CM

## 2020-10-19 DIAGNOSIS — D509 Iron deficiency anemia, unspecified: Secondary | ICD-10-CM

## 2020-10-19 MED ORDER — PEG 3350-KCL-NA BICARB-NACL 420 G PO SOLR: 4000.0000 mL | ORAL | 0 refills | Status: DC

## 2020-10-19 MED ORDER — PEG 3350-KCL-NA BICARB-NACL 420 G PO SOLR
4000.0000 mL | ORAL | 0 refills | Status: DC
Start: 2020-10-19 — End: 2020-11-09

## 2020-10-19 NOTE — Telephone Encounter (Signed)
Pt's daughter called office, TCS/EGD scheduled for 11/10/20 at 8:00am. She will need to have pregnancy test 11/06/20. Orders entered. Lab letter and procedure instructions mailed.   Informed Melanie at endo of procedure being scheduled.

## 2020-10-20 ENCOUNTER — Other Ambulatory Visit: Payer: Self-pay

## 2020-10-20 MED ORDER — PEG 3350-KCL-NA BICARB-NACL 420 G PO SOLR: 4000.0000 mL | ORAL | 0 refills | Status: DC

## 2020-10-20 NOTE — Telephone Encounter (Signed)
Rx for prep faxed to pharmacy, unable to e-prescribe.

## 2020-11-06 ENCOUNTER — Other Ambulatory Visit (HOSPITAL_COMMUNITY)
Admission: RE | Admit: 2020-11-06 | Discharge: 2020-11-06 | Disposition: A | Discharge status: Home / Self Care | Payer: PRIVATE HEALTH INSURANCE | Source: Ambulatory Visit | Attending: Internal Medicine | Admitting: Internal Medicine

## 2020-11-06 DIAGNOSIS — R6881 Early satiety: Secondary | ICD-10-CM | POA: Insufficient documentation

## 2020-11-06 DIAGNOSIS — D509 Iron deficiency anemia, unspecified: Secondary | ICD-10-CM | POA: Insufficient documentation

## 2020-11-06 DIAGNOSIS — B9681 Helicobacter pylori [H. pylori] as the cause of diseases classified elsewhere: Secondary | ICD-10-CM | POA: Insufficient documentation

## 2020-11-06 DIAGNOSIS — R109 Unspecified abdominal pain: Secondary | ICD-10-CM | POA: Insufficient documentation

## 2020-11-06 DIAGNOSIS — K2970 Gastritis, unspecified, without bleeding: Secondary | ICD-10-CM | POA: Insufficient documentation

## 2020-11-06 LAB — PREGNANCY, URINE: Preg Test, Ur: NEGATIVE

## 2020-11-09 ENCOUNTER — Telehealth: Payer: Self-pay | Admitting: Internal Medicine

## 2020-11-09 MED ORDER — PEG 3350-KCL-NA BICARB-NACL 420 G PO SOLR: ORAL | 0 refills | Status: DC

## 2020-11-09 NOTE — Addendum Note (Signed)
Addended by: Cheron Every on: 11/09/2020 10:45 AM   Modules accepted: Orders

## 2020-11-09 NOTE — Telephone Encounter (Signed)
Please make sure patient prep is sent to pharmacy

## 2020-11-09 NOTE — Telephone Encounter (Signed)
Rx was sent  

## 2020-11-10 ENCOUNTER — Other Ambulatory Visit: Payer: Self-pay

## 2020-11-10 ENCOUNTER — Encounter (HOSPITAL_COMMUNITY)
Admission: RE | Disposition: A | Discharge status: Home / Self Care | Payer: Self-pay | Source: Home / Self Care | Attending: Internal Medicine

## 2020-11-10 ENCOUNTER — Ambulatory Visit (HOSPITAL_COMMUNITY): Payer: Self-pay | Admitting: Certified Registered"

## 2020-11-10 ENCOUNTER — Encounter (HOSPITAL_COMMUNITY): Payer: Self-pay

## 2020-11-10 ENCOUNTER — Ambulatory Visit (HOSPITAL_COMMUNITY)
Admission: RE | Admit: 2020-11-10 | Discharge: 2020-11-10 | Disposition: A | Discharge status: Home / Self Care | Payer: Self-pay | Attending: Internal Medicine | Admitting: Internal Medicine

## 2020-11-10 DIAGNOSIS — K648 Other hemorrhoids: Secondary | ICD-10-CM | POA: Insufficient documentation

## 2020-11-10 DIAGNOSIS — Z8719 Personal history of other diseases of the digestive system: Secondary | ICD-10-CM | POA: Insufficient documentation

## 2020-11-10 DIAGNOSIS — Z888 Allergy status to other drugs, medicaments and biological substances status: Secondary | ICD-10-CM | POA: Insufficient documentation

## 2020-11-10 DIAGNOSIS — B9681 Helicobacter pylori [H. pylori] as the cause of diseases classified elsewhere: Secondary | ICD-10-CM

## 2020-11-10 DIAGNOSIS — Z881 Allergy status to other antibiotic agents status: Secondary | ICD-10-CM | POA: Insufficient documentation

## 2020-11-10 DIAGNOSIS — K2970 Gastritis, unspecified, without bleeding: Secondary | ICD-10-CM | POA: Insufficient documentation

## 2020-11-10 DIAGNOSIS — D509 Iron deficiency anemia, unspecified: Secondary | ICD-10-CM | POA: Insufficient documentation

## 2020-11-10 DIAGNOSIS — Z79899 Other long term (current) drug therapy: Secondary | ICD-10-CM | POA: Insufficient documentation

## 2020-11-10 HISTORY — PX: ESOPHAGOGASTRODUODENOSCOPY (EGD) WITH PROPOFOL: SHX5813

## 2020-11-10 HISTORY — PX: BIOPSY: SHX5522

## 2020-11-10 HISTORY — PX: COLONOSCOPY WITH PROPOFOL: SHX5780

## 2020-11-10 HISTORY — DX: Essential (primary) hypertension: I10

## 2020-11-10 SURGERY — COLONOSCOPY WITH PROPOFOL
Anesthesia: General

## 2020-11-10 MED ORDER — ONDANSETRON HCL 4 MG/2ML IJ SOLN
INTRAMUSCULAR | Status: DC | PRN
  Administered 2020-11-10: 4 mg via INTRAVENOUS

## 2020-11-10 MED ORDER — LIDOCAINE HCL (CARDIAC) PF 100 MG/5ML IV SOSY
PREFILLED_SYRINGE | INTRAVENOUS | Status: DC | PRN
  Administered 2020-11-10: 50 mg via INTRAVENOUS

## 2020-11-10 MED ORDER — PROPOFOL 10 MG/ML IV BOLUS
INTRAVENOUS | Status: DC | PRN
  Administered 2020-11-10: 40 mg via INTRAVENOUS
  Administered 2020-11-10: 100 mg via INTRAVENOUS
  Administered 2020-11-10: 40 mg via INTRAVENOUS

## 2020-11-10 MED ORDER — ONDANSETRON HCL 4 MG/2ML IJ SOLN
INTRAMUSCULAR | Status: AC
  Filled 2020-11-10: qty 2

## 2020-11-10 MED ORDER — LACTATED RINGERS IV SOLN: INTRAVENOUS | Status: DC

## 2020-11-10 MED ORDER — PROPOFOL 500 MG/50ML IV EMUL
INTRAVENOUS | Status: DC | PRN
  Administered 2020-11-10: 200 ug/kg/min via INTRAVENOUS

## 2020-11-10 NOTE — Op Note (Signed)
[facility] Patient Name: Nadege Carriger Procedure Date: 11/10/2020 7:24 AM MRN: [id number] Date of Birth: 09-17-1970 Attending MD: Elon Alas. Edgar Frisk CSN: [id number] Age: 50 Admit Type: Outpatient Procedure:                Upper GI endoscopy Indications:              Epigastric abdominal pain, Iron deficiency anemia,                            Follow-up of Helicobacter pylori Providers:                Elon Alas. Abbey Chatters, DO, Gwenlyn Fudge, RN, Randa Spike, Technician Referring MD:              Medicines:                See the Anesthesia note for documentation of the                            administered medications Complications:            No immediate complications. Estimated Blood Loss:     Estimated blood loss was minimal. Procedure:                Pre-Anesthesia Assessment:                           - The anesthesia plan was to use monitored                            anesthesia care (MAC).                           After obtaining informed consent, the endoscope was                            passed under direct vision. Throughout the                            procedure, the patient's blood pressure, pulse, and                            oxygen saturations were monitored continuously. The                            [id number]-H190 ([id number]) scope was introduced through the                            mouth, and advanced to the second part of duodenum.                            The upper GI endoscopy was accomplished without                            difficulty. The patient tolerated  the procedure                            well. Scope In: 8:00:15 AM Scope Out: 8:05:46 AM Total Procedure Duration: 0 hours 5 minutes 31 seconds  Findings:      There is no endoscopic evidence of bleeding, areas of erosion,       esophagitis or hiatal hernia in the entire esophagus.      Patchy mild inflammation characterized by erythema was found in the        entire examined stomach. Biopsies were taken with a cold forceps for       Helicobacter pylori testing.      The duodenal bulb, first portion of the duodenum and second portion of       the duodenum were normal. Impression:               - Gastritis. Biopsied.                           - Normal duodenal bulb, first portion of the                            duodenum and second portion of the duodenum. Moderate Sedation:      Per Anesthesia Care Recommendation:           - Patient has a contact number available for                            emergencies. The signs and symptoms of potential                            delayed complications were discussed with the                            patient. Return to normal activities tomorrow.                            Written discharge instructions were provided to the                            patient.                           - Resume previous diet.                           - Continue present medications.                           - Await pathology results.                           - Return to GI clinic in 3 months. Procedure Code(s):        --- Professional ---                           [phone], Esophagogastroduodenoscopy, flexible,  transoral; with biopsy, single or multiple Diagnosis Code(s):        --- Professional ---                           K29.70, Gastritis, unspecified, without bleeding                           R10.13, Epigastric pain                           D50.9, Iron deficiency anemia, unspecified                           D92.42, Helicobacter pylori [H. pylori] as the                            cause of diseases classified elsewhere CPT copyright 2019 [employer]. All rights reserved. The codes documented in this report are preliminary and upon coder review may  be revised to meet current compliance requirements. Elon Alas. Abbey Chatters, DO [place] Abbey Chatters, DO 11/10/2020 8:09:00  AM This report has been signed electronically. Number of Addenda: 0
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
## 2020-11-10 NOTE — Op Note (Signed)
[facility] Patient Name: Joyce Roberts Procedure Date: 11/10/2020 8:08 AM MRN: [id number] Date of Birth: 1971-01-07 Attending MD: Elon Alas. Abbey Chatters DO CSN: [id number] Age: 50 Admit Type: Outpatient Procedure:                Colonoscopy Indications:              Iron deficiency anemia Providers:                Elon Alas. Abbey Chatters, DO, Gwenlyn Fudge, RN, Randa Spike, Technician Referring MD:              Medicines:                See the Anesthesia note for documentation of the                            administered medications Complications:            No immediate complications. Estimated Blood Loss:     Estimated blood loss: none. Procedure:                Pre-Anesthesia Assessment:                           - The anesthesia plan was to use monitored                            anesthesia care (MAC).                           After obtaining informed consent, the colonoscope                            was passed under direct vision. Throughout the                            procedure, the patient's blood pressure, pulse, and                            oxygen saturations were monitored continuously. The                            PCF-HQ190L ([id number]) scope was introduced through                            the anus and advanced to the the cecum, identified                            by appendiceal orifice and ileocecal valve. The                            colonoscopy was performed without difficulty. The                            patient tolerated the procedure well. The quality  of the bowel preparation was evaluated using the                            BBPS [facility] Bowel Preparation Scale) with scores                            of: Right Colon = 3, Transverse Colon = 3 and Left                            Colon = 3 (entire mucosa seen well with no residual                            staining, small fragments of stool or  opaque                            liquid). The total BBPS score equals 9. Scope In: 8:09:49 AM Scope Out: 8:18:55 AM Scope Withdrawal Time: 0 hours 6 minutes 28 seconds  Total Procedure Duration: 0 hours 9 minutes 6 seconds  Findings:      Non-bleeding internal hemorrhoids were found during endoscopy.      Hemorrhoids were found on perianal exam.      The exam was otherwise without abnormality. Impression:               - Non-bleeding internal hemorrhoids.                           - Hemorrhoids found on perianal exam.                           - The examination was otherwise normal.                           - No specimens collected. Moderate Sedation:      Per Anesthesia Care Recommendation:           - Patient has a contact number available for                            emergencies. The signs and symptoms of potential                            delayed complications were discussed with the                            patient. Return to normal activities tomorrow.                            Written discharge instructions were provided to the                            patient.                           - Resume previous diet.                           -  Continue present medications.                           - Repeat colonoscopy in 10 years for screening                            purposes.                           - Return to GI clinic in 3 months. Procedure Code(s):        --- Professional ---                           [phone], Colonoscopy, flexible; diagnostic, including                            collection of specimen(s) by brushing or washing,                            when performed (separate procedure) Diagnosis Code(s):        --- Professional ---                           K64.8, Other hemorrhoids                           D50.9, Iron deficiency anemia, unspecified CPT copyright 2019 American Medical Association. All rights reserved. The codes documented in this report  are preliminary and upon coder review may  be revised to meet current compliance requirements. Elon Alas. Abbey Chatters, DO [place] Abbey Chatters, DO 11/10/2020 8:21:49 AM This report has been signed electronically. Number of Addenda: 0

## 2020-11-10 NOTE — Anesthesia Postprocedure Evaluation (Signed)
Anesthesia Post Note  Patient: [name]  Procedure(s) Performed: COLONOSCOPY WITH PROPOFOL ESOPHAGOGASTRODUODENOSCOPY (EGD) WITH PROPOFOL BIOPSY  Patient location during evaluation: Endoscopy Anesthesia Type: General Level of consciousness: awake and alert and oriented Pain management: pain level controlled Vital Signs Assessment: post-procedure vital signs reviewed and stable Respiratory status: spontaneous breathing and respiratory function stable Cardiovascular status: blood pressure returned to baseline and stable Postop Assessment: no apparent nausea or vomiting Anesthetic complications: no   No notable events documented.   Last Vitals:  Vitals:   11/10/20 0822 11/10/20 0826  BP: (!) 86/57 103/62  Resp: 17   Temp:    SpO2: 98%     Last Pain:  Vitals:   11/10/20 0826  TempSrc:   PainSc: 0-No pain                 Matthews Franks C Arafat Cocuzza

## 2020-11-10 NOTE — Anesthesia Preprocedure Evaluation (Addendum)
Anesthesia Evaluation  Patient identified by MRN, date of birth, ID band Patient awake, Patient confused and Patient unresponsive    Reviewed: Allergy & Precautions, NPO status , Patient's Chart, lab work & pertinent test results  History of Anesthesia Complications (+) PONV and history of anesthetic complications  Airway Mallampati: II  TM Distance: >3 FB Neck ROM: Full    Dental  (+) Dental Advisory Given, Caps Crowns :   Pulmonary shortness of breath and with exertion,    Pulmonary exam normal breath sounds clear to auscultation       Cardiovascular Exercise Tolerance: Good hypertension, Pt. on medications Normal cardiovascular exam Rhythm:Regular Rate:Normal     Neuro/Psych negative neurological ROS  negative psych ROS   GI/Hepatic Neg liver ROS, GERD  Medicated and Controlled,  Endo/Other  negative endocrine ROS  Renal/GU negative Renal ROS     Musculoskeletal negative musculoskeletal ROS (+)   Abdominal   Peds  Hematology  (+) anemia ,   Anesthesia Other Findings   Reproductive/Obstetrics negative OB ROS                            Anesthesia Physical Anesthesia Plan  ASA: 2  Anesthesia Plan: General   Post-op Pain Management:    Induction: Intravenous  PONV Risk Score and Plan: Propofol infusion  Airway Management Planned: Nasal Cannula and Natural Airway  Additional Equipment:   Intra-op Plan:   Post-operative Plan:   Informed Consent: I have reviewed the patients History and Physical, chart, labs and discussed the procedure including the risks, benefits and alternatives for the proposed anesthesia with the patient or authorized representative who has indicated his/her understanding and acceptance.     Dental advisory given  Plan Discussed with: Surgeon  Anesthesia Plan Comments:         Anesthesia Quick Evaluation

## 2020-11-10 NOTE — Transfer of Care (Signed)
Immediate Anesthesia Transfer of Care Note  Patient: Joyce Roberts  Procedure(s) Performed: COLONOSCOPY WITH PROPOFOL ESOPHAGOGASTRODUODENOSCOPY (EGD) WITH PROPOFOL BIOPSY  Patient Location: Endoscopy Unit  Anesthesia Type:General  Level of Consciousness: drowsy  Airway & Oxygen Therapy: Patient Spontanous Breathing  Post-op Assessment: Report given to RN and Post -op Vital signs reviewed and stable  Post vital signs: Reviewed and stable  Last Vitals:  Vitals Value Taken Time  BP    Temp    Pulse    Resp    SpO2      Last Pain:  Vitals:   11/10/20 0757  TempSrc:   PainSc: 6          Complications: No notable events documented.

## 2020-11-10 NOTE — Discharge Instructions (Addendum)
EGD Discharge instructions Please read the instructions outlined below and refer to this sheet in the next few weeks. These discharge instructions provide you with general information on caring for yourself after you leave the hospital. Your doctor may also give you specific instructions. While your treatment has been planned according to the most current medical practices available, unavoidable complications occasionally occur. If you have any problems or questions after discharge, please call your doctor. ACTIVITY You may resume your regular activity but move at a slower pace for the next 24 hours.  Take frequent rest periods for the next 24 hours.  Walking will help expel (get rid of) the air and reduce the bloated feeling in your abdomen.  No driving for 24 hours (because of the anesthesia (medicine) used during the test).  You may shower.  Do not sign any important legal documents or operate any machinery for 24 hours (because of the anesthesia used during the test).  NUTRITION Drink plenty of fluids.  You may resume your normal diet.  Begin with a light meal and progress to your normal diet.  Avoid alcoholic beverages for 24 hours or as instructed by your caregiver.  MEDICATIONS You may resume your normal medications unless your caregiver tells you otherwise.  WHAT YOU CAN EXPECT TODAY You may experience abdominal discomfort such as a feeling of fullness or "gas" pains.  FOLLOW-UP Your doctor will discuss the results of your test with you.  SEEK IMMEDIATE MEDICAL ATTENTION IF ANY OF THE FOLLOWING OCCUR: Excessive nausea (feeling sick to your stomach) and/or vomiting.  Severe abdominal pain and distention (swelling).  Trouble swallowing.  Temperature over 101 F (37.8 C).  Rectal bleeding or vomiting of blood.      Colonoscopy Discharge Instructions  Read the instructions outlined below and refer to this sheet in the next few weeks. These discharge instructions provide you  with general information on caring for yourself after you leave the hospital. Your doctor may also give you specific instructions. While your treatment has been planned according to the most current medical practices available, unavoidable complications occasionally occur.   ACTIVITY You may resume your regular activity, but move at a slower pace for the next 24 hours.  Take frequent rest periods for the next 24 hours.  Walking will help get rid of the air and reduce the bloated feeling in your belly (abdomen).  No driving for 24 hours (because of the medicine (anesthesia) used during the test).   Do not sign any important legal documents or operate any machinery for 24 hours (because of the anesthesia used during the test).  NUTRITION Drink plenty of fluids.  You may resume your normal diet as instructed by your doctor.  Begin with a light meal and progress to your normal diet. Heavy or fried foods are harder to digest and may make you feel sick to your stomach (nauseated).  Avoid alcoholic beverages for 24 hours or as instructed.  MEDICATIONS You may resume your normal medications unless your doctor tells you otherwise.  WHAT YOU CAN EXPECT TODAY Some feelings of bloating in the abdomen.  Passage of more gas than usual.  Spotting of blood in your stool or on the toilet paper.  IF YOU HAD POLYPS REMOVED DURING THE COLONOSCOPY: No aspirin products for 7 days or as instructed.  No alcohol for 7 days or as instructed.  Eat a soft diet for the next 24 hours.  FINDING OUT THE RESULTS OF YOUR TEST Not all test results  are available during your visit. If your test results are not back during the visit, make an appointment with your caregiver to find out the results. Do not assume everything is normal if you have not heard from your caregiver or the medical facility. It is important for you to follow up on all of your test results.  SEEK IMMEDIATE MEDICAL ATTENTION IF: You have more than a  spotting of blood in your stool.  Your belly is swollen (abdominal distention).  You are nauseated or vomiting.  You have a temperature over 101.  You have abdominal pain or discomfort that is severe or gets worse throughout the day.   Your EGD revealed mild amount inflammation in your stomach.  I took numerous biopsies given your history of H. pylori infection.  We will send this to a specialized lab.  Await results.  My office will contact you.  Your colonoscopy was relatively unremarkable.  I did not find any polyps or evidence of colon cancer.  I recommend repeating colonoscopy in 10 years for colon cancer screening purposes.    Follow-up with GI in 3 months.  I hope you have a great rest of your week!  Elon Alas. Abbey Chatters, D.O. Gastroenterology and Hepatology [facility]

## 2020-11-16 ENCOUNTER — Telehealth: Payer: Self-pay | Admitting: Internal Medicine

## 2020-11-16 NOTE — Telephone Encounter (Signed)
[phone] please call patient, her daughter called stating that after her procedure her mother had a sore throat and a fever

## 2020-11-16 NOTE — Telephone Encounter (Signed)
Dr. Abbey Chatters the pt's daughter called and stated that right after the procedure her mothers throat has been really sore and that she had a fever to start Friday afternoon of 102. Pt states that everytime she eats it burns her abdomen. No headache, fever,chills. But the pt is experiencing some nausea. (No vomiting).

## 2020-11-17 LAB — SURGICAL PATHOLOGY

## 2020-11-18 ENCOUNTER — Telehealth: Payer: Self-pay | Admitting: Physician Assistant

## 2020-11-18 NOTE — Telephone Encounter (Signed)
patient called and said she had a sore throat and a fever. The patient was advised to take a COVID test, take Tylenol , rest, and gargle with salt water.

## 2020-11-19 ENCOUNTER — Other Ambulatory Visit: Payer: Self-pay

## 2020-11-19 ENCOUNTER — Encounter (HOSPITAL_COMMUNITY): Payer: Self-pay | Admitting: *Deleted

## 2020-11-19 ENCOUNTER — Emergency Department (HOSPITAL_COMMUNITY)
Admission: EM | Admit: 2020-11-19 | Discharge: 2020-11-19 | Disposition: A | Discharge status: Home / Self Care | Payer: PRIVATE HEALTH INSURANCE | Attending: Emergency Medicine | Admitting: Emergency Medicine

## 2020-11-19 DIAGNOSIS — U071 COVID-19: Secondary | ICD-10-CM | POA: Insufficient documentation

## 2020-11-19 DIAGNOSIS — R109 Unspecified abdominal pain: Secondary | ICD-10-CM | POA: Insufficient documentation

## 2020-11-19 DIAGNOSIS — I10 Essential (primary) hypertension: Secondary | ICD-10-CM | POA: Insufficient documentation

## 2020-11-19 DIAGNOSIS — Z79899 Other long term (current) drug therapy: Secondary | ICD-10-CM | POA: Insufficient documentation

## 2020-11-19 LAB — URINALYSIS, ROUTINE W REFLEX MICROSCOPIC
Bilirubin Urine: NEGATIVE
Glucose, UA: NEGATIVE mg/dL
Ketones, ur: NEGATIVE mg/dL
Nitrite: NEGATIVE
Protein, ur: NEGATIVE mg/dL
Specific Gravity, Urine: 1.016 (ref 1.005–1.030)
pH: 6 (ref 5.0–8.0)

## 2020-11-19 LAB — PREGNANCY, URINE: Preg Test, Ur: NEGATIVE

## 2020-11-19 MED ORDER — CHLORASEPTIC 1.4 % MT LIQD: 1.0000 | OROMUCOSAL | 0 refills | Status: DC | PRN

## 2020-11-19 MED ORDER — ALBUTEROL SULFATE HFA 108 (90 BASE) MCG/ACT IN AERS
2.0000 | INHALATION_SPRAY | Freq: Once | RESPIRATORY_TRACT | Status: AC
  Administered 2020-11-19: 2 via RESPIRATORY_TRACT
  Filled 2020-11-19: qty 6.7

## 2020-11-19 MED ORDER — BENZONATATE 100 MG PO CAPS: 100.0000 mg | ORAL_CAPSULE | Freq: Three times a day (TID) | ORAL | 0 refills | Status: AC

## 2020-11-19 NOTE — Telephone Encounter (Signed)
I called and spoke with patient.  She was diagnosed with COVID yesterday.  This is likely the cause of her issues since EGD.

## 2020-11-19 NOTE — ED Triage Notes (Signed)
Pt with fever since Friday night, +chills and sore throat, productive cough that was green in color, dry at present per pt. Pt had recent endo/colonoscopy last week.  Positive at home covid test last night.

## 2020-11-19 NOTE — Discharge Instructions (Addendum)
Take tessalon and use chloraseptic as directed   take two puffs of the albuterol inhaler as needed for shortness of breath or cough  A culture was sent of your urine today to determine if there is any bacterial growth. If the results of the culture are positive and you require an antibiotic or a change of your prescribed antibiotic you will be contacted by the hospital. If the results are negative you will not be contacted.  Please follow up with your primary care provider within 5-7 days for re-evaluation of your symptoms. If you do not have a primary care provider, information for a healthcare clinic has been provided for you to make arrangements for follow up care. Please return to the emergency department for any new or worsening symptoms.  ----------------------------  Joyce Roberts y use chloraseptic segn las indicaciones.  tome dos bocanadas del inhalador de albuterol segn sea necesario para la dificultad para respirar o la tos  Se envi un cultivo de su orina hoy para determinar si hay algn crecimiento bacteriano. Si los resultados del cultivo son positivos y requiere un antibitico o un cambio de su antibitico recetado, el hospital se comunicar con usted. Si los resultados son negativos, no ser contactado.  Haga un seguimiento con su proveedor de atencin primaria dentro de los 5 a 7 das para una reevaluacin de sus sntomas. Si no tiene un proveedor de Midwife, se le ha proporcionado informacin sobre una clnica de atencin mdica para que pueda hacer arreglos para la atencin de seguimiento. Regrese al departamento de emergencias por cualquier sntoma nuevo o que empeore.
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
## 2020-11-21 LAB — URINE CULTURE

## 2020-12-07 ENCOUNTER — Encounter (HOSPITAL_COMMUNITY): Payer: Self-pay | Admitting: Internal Medicine

## 2020-12-09 ENCOUNTER — Encounter (HOSPITAL_COMMUNITY): Payer: Self-pay | Admitting: Internal Medicine

## 2020-12-25 ENCOUNTER — Telehealth: Payer: Self-pay | Admitting: Gastroenterology

## 2020-12-25 NOTE — Telephone Encounter (Signed)
Patient needs ov with Abbey Chatters only to discuss management of refractory h.pylori. please arrange.

## 2020-12-29 ENCOUNTER — Encounter: Payer: Self-pay | Admitting: Internal Medicine

## 2021-01-05 ENCOUNTER — Other Ambulatory Visit: Payer: Self-pay | Admitting: Physician Assistant

## 2021-01-05 DIAGNOSIS — I10 Essential (primary) hypertension: Secondary | ICD-10-CM

## 2021-01-20 ENCOUNTER — Other Ambulatory Visit (HOSPITAL_COMMUNITY)
Admission: RE | Admit: 2021-01-20 | Discharge: 2021-01-20 | Disposition: A | Discharge status: Home / Self Care | Payer: Self-pay | Source: Ambulatory Visit | Attending: Physician Assistant | Admitting: Physician Assistant

## 2021-01-20 DIAGNOSIS — I10 Essential (primary) hypertension: Secondary | ICD-10-CM | POA: Insufficient documentation

## 2021-01-20 LAB — BASIC METABOLIC PANEL
Anion gap: 6 (ref 5–15)
BUN: 14 mg/dL (ref 6–20)
CO2: 26 mmol/L (ref 22–32)
Calcium: 8.8 mg/dL — ABNORMAL LOW (ref 8.9–10.3)
Chloride: 106 mmol/L (ref 98–111)
Creatinine, Ser: 0.65 mg/dL (ref 0.44–1.00)
GFR, Estimated: >60 mL/min (ref >60)
Glucose, Bld: 90 mg/dL (ref 70–99)
Potassium: 4.1 mmol/L (ref 3.5–5.1)
Sodium: 138 mmol/L (ref 135–145)

## 2021-01-21 ENCOUNTER — Ambulatory Visit: Payer: Self-pay | Admitting: Physician Assistant

## 2021-01-25 ENCOUNTER — Ambulatory Visit: Payer: Self-pay | Admitting: Physician Assistant

## 2021-01-25 DIAGNOSIS — Z789 Other specified health status: Secondary | ICD-10-CM

## 2021-01-25 DIAGNOSIS — J069 Acute upper respiratory infection, unspecified: Secondary | ICD-10-CM

## 2021-01-25 DIAGNOSIS — I10 Essential (primary) hypertension: Secondary | ICD-10-CM

## 2021-01-25 MED ORDER — OMEPRAZOLE 20 MG PO CPDR: DELAYED_RELEASE_CAPSULE | ORAL | 0 refills | Status: DC

## 2021-01-25 MED ORDER — CETIRIZINE HCL 10 MG PO TABS: 10.0000 mg | ORAL_TABLET | Freq: Every day | ORAL | 0 refills | Status: DC | PRN

## 2021-01-25 MED ORDER — FLUTICASONE PROPIONATE 50 MCG/ACT NA SUSP: 2.0000 | Freq: Every day | NASAL | 0 refills | Status: DC

## 2021-01-25 MED ORDER — AMOXICILLIN 500 MG PO CAPS: 500.0000 mg | ORAL_CAPSULE | Freq: Three times a day (TID) | ORAL | 0 refills | Status: AC

## 2021-01-25 MED ORDER — LOSARTAN POTASSIUM 50 MG PO TABS: 50.0000 mg | ORAL_TABLET | Freq: Every day | ORAL | 0 refills | Status: DC

## 2021-01-25 NOTE — Progress Notes (Signed)
There were no vitals taken for this visit.   Subjective:    Patient ID: Joyce Roberts, female    DOB: [birth year], 50 y.o.   MRN: [id number]  HPI: [name] is a 50 y.o. female presenting on 01/25/2021 for No chief complaint on file.   HPI  This is a telemedicine appointment through [employer].  Pt was scheduled for in-person appointment today but was changed to virtual appointment due to she is sick.  I connected with  Joyce Roberts on 01/25/21 by a video enabled telemedicine application and verified that I am speaking with the correct person using two identifiers.   I discussed the limitations of evaluation and management by telemedicine. The patient expressed understanding and agreed to proceed.  Pt is in her parked car.  Provider and translater are at office.     Pt is 45yoF who presents for routine follow up of HTN.    She had + screening questionnaire today  She had + covid in august by home test.  She has not done a covid test since being sick this time.   She does not check her bp at home.  She has congestion and HA now.    That has been going for 2 wk.   She is using flonzase and zyrtec.   The allergy medication helps.    She has not done a voci d  test and it was neg 3 da ago.    Relevant past medical, surgical, family and social history reviewed and updated as indicated. Interim medical history since our last visit reviewed. Allergies and medications reviewed and updated.    Current Outpatient Medications:    cetirizine (ZYRTEC) 10 MG tablet, Take 1 tablet (10 mg total) by mouth daily as needed for allergies. Tome una tableta por boca diaria cuando sea necesario para las alergias, Disp: 90 tablet, Rfl: 0   fluticasone (FLONASE) 50 MCG/ACT nasal spray, Place 2 sprays into both nostrils daily. pongase 2 sprays en los dos oyos de la [place], Disp: 16 g, Rfl: 0   losartan (COZAAR) 50 MG tablet, Take 1 tablet (50 mg total) by mouth daily. Tome una tableta  por boca diaria, Disp: 90 tablet, Rfl: 0   omeprazole (PRILOSEC) 20 MG capsule, 1 PO BID. 1 POR BOCA DOS VECES AL DIA, Disp: 180 capsule, Rfl: 0   acetaminophen (TYLENOL) 500 MG tablet, Take 1,000 mg by mouth every 6 (six) hours as needed for moderate pain., Disp: , Rfl:    gabapentin (NEURONTIN) 100 MG capsule, Take 1 capsule (100 mg total) by mouth 3 (three) times daily. Take 1 capsule three times a day for 2 weeks. Take 1 capsule at lunch and PM for 1 week. Take 1 capsule in the PM for 1 week. [Tomar 1 c psula tres veces al d a durante 2 semanas. Tome 1 c psula en el almuerzo y en la tarde durante 1 semana. Tome 1 c psula por la tarde durante 1 semana.], Disp: 90 capsule, Rfl: 0   ibuprofen (ADVIL) 200 MG tablet, Take 200 mg by mouth every 6 (six) hours as needed for moderate pain., Disp: , Rfl:    mirabegron ER (MYRBETRIQ) 25 MG TB24 tablet, Take 1 tablet (25 mg total) by mouth daily., Disp: 30 tablet, Rfl: 0   phenol (CHLORASEPTIC) 1.4 % LIQD, Use as directed 1 spray in the mouth or throat as needed for throat irritation / pain., Disp: 177 mL, Rfl: 0   polyethylene glycol-electrolytes (  NULYTELY) 420 g solution, As directed, Disp: 4000 mL, Rfl: 0    Review of Systems  Per HPI unless specifically indicated above     Objective:    There were no vitals taken for this visit.  Wt Readings from Last 3 Encounters:  11/19/20 149 lb (67.6 kg)  11/10/20 150 lb (68 kg)  10/13/20 149 lb (67.6 kg)    Physical Exam Constitutional:      General: She is not in acute distress.    Appearance: She is not toxic-appearing.  HENT:     Head: Normocephalic and atraumatic.  Pulmonary:     Effort: No respiratory distress.     Comments: Pt is talking in complete sentences without dyspnea Neurological:     Mental Status: She is alert and oriented to person, place, and time.  Psychiatric:        Attention and Perception: Attention normal.        Speech: Speech normal.        Behavior: Behavior is  cooperative.    Results for orders placed or performed during the hospital encounter of 60/73/71  Basic metabolic panel  Result Value Ref Range   Sodium 138 135 - 145 mmol/L   Potassium 4.1 3.5 - 5.1 mmol/L   Chloride 106 98 - 111 mmol/L   CO2 26 22 - 32 mmol/L   Glucose, Bld 90 70 - 99 mg/dL   BUN 14 6 - 20 mg/dL   Creatinine, Ser 0.65 0.44 - 1.00 mg/dL   Calcium 8.8 (L) 8.9 - 10.3 mg/dL   GFR, Estimated >60 >60 mL/min   Anion gap 6 5 - 15        Assessment & Plan:   Encounter Diagnoses  Name Primary?   Primary hypertension Yes   Upper respiratory tract infection, unspecified type    Non-English speaking patient      -pt to continue current medications for the HTN -pt is encouraged to take a home covid test. -Rx amoxil for sinuses -Encouraged covid booster shot (when feeling  better) -pt to follow up 2 months for htn.  She is to contact office sooner prn

## 2021-01-28 ENCOUNTER — Encounter: Payer: Self-pay | Admitting: Physician Assistant

## 2021-02-03 ENCOUNTER — Other Ambulatory Visit: Payer: Self-pay

## 2021-02-03 ENCOUNTER — Ambulatory Visit (INDEPENDENT_AMBULATORY_CARE_PROVIDER_SITE_OTHER): Payer: Self-pay | Admitting: Internal Medicine

## 2021-02-03 ENCOUNTER — Encounter: Payer: Self-pay | Admitting: Internal Medicine

## 2021-02-03 VITALS — BP 138/89 | HR 83 | Temp 97.3°F | Ht 60.0 in | Wt 153.4 lb

## 2021-02-03 DIAGNOSIS — B9681 Helicobacter pylori [H. pylori] as the cause of diseases classified elsewhere: Secondary | ICD-10-CM

## 2021-02-03 DIAGNOSIS — R1013 Epigastric pain: Secondary | ICD-10-CM

## 2021-02-03 DIAGNOSIS — K581 Irritable bowel syndrome with constipation: Secondary | ICD-10-CM

## 2021-02-03 DIAGNOSIS — K2970 Gastritis, unspecified, without bleeding: Secondary | ICD-10-CM

## 2021-02-03 DIAGNOSIS — G8929 Other chronic pain: Secondary | ICD-10-CM

## 2021-02-03 NOTE — Patient Instructions (Signed)
Ordenar prueba de heces infeccin por H. pylori. Deber llevar la Jeri Lager al hospital. Deber retener su Omeprazole durante 2 semanas antes de recolectar la [place]. Te llamar con resultados.  Espero que tengas un gran resto de tu semana.  Dr. Abbey Chatters
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
## 2021-02-08 ENCOUNTER — Other Ambulatory Visit: Payer: Self-pay

## 2021-02-08 ENCOUNTER — Ambulatory Visit (INDEPENDENT_AMBULATORY_CARE_PROVIDER_SITE_OTHER): Payer: Self-pay | Admitting: Urology

## 2021-02-08 ENCOUNTER — Encounter: Payer: Self-pay | Admitting: Urology

## 2021-02-08 DIAGNOSIS — R350 Frequency of micturition: Secondary | ICD-10-CM

## 2021-02-08 DIAGNOSIS — K625 Hemorrhage of anus and rectum: Secondary | ICD-10-CM

## 2021-02-08 DIAGNOSIS — R310 Gross hematuria: Secondary | ICD-10-CM

## 2021-02-08 NOTE — Patient Instructions (Signed)
Hematuria en adultos Hematuria, Adult La hematuria es la presencia de sangre en la orina. La sangre puede ser visible en la orina o se puede identificar con un anlisis. La causa de esta afeccin puede ser infecciones de la vejiga, la uretra, el rin o la prstata. Otras causas posibles incluyen lo siguiente: Clculos renales. Cncer de las vas urinarias. Exceso de calcio en la orina. Enfermedades que se transmiten de padres a hijos (enfermedades hereditarias). Ejercicios que Dealer. Normalmente, las infecciones pueden tratarse con medicamentos, y los clculos renales, por lo general, se eliminarn a travs de la orina. Si ninguna de estas es la causa de la hematuria, quizs sea Chartered loss adjuster ms estudios para identificar la causa de los sntomas que presenta. Es muy importante que le informe a su mdico si ve sangre en la orina, incluso si no tiene dolor o si el sangrado desaparece sin tratamiento. La sangre en la orina, que desaparece y vuelve a Arts administrator, puede ser un sntoma de una enfermedad muy grave, incluido Science writer. No se manifiesta dolor en las etapas iniciales de muchos tipos de cncer urinarios. Siga estas instrucciones en su casa: Medicamentos Use los medicamentos de venta libre y los recetados solamente como se lo haya indicado el mdico. Si le recetaron un antibitico, tmelo como se lo haya indicado el mdico. No deje de tomar el antibitico aunque comience a sentirse mejor. Comida y bebida Beba suficiente lquido como para Theatre manager la orina de color amarillo plido. Se recomienda que beba de 3 a 4 cuartos de galn (de 2.8 a 3.8 l) por da. Si le han diagnosticado una infeccin, se recomienda beber jugo de [place], adems de grandes cantidades de agua. Evite la cafena, el t y las bebidas gaseosas. Estas sustancias tienden a Medical illustrator. Evite el alcohol, ya que Advertising account planner prstata (en los hombres). Indicaciones generales Si le han diagnosticado  clculos renales, siga las instrucciones de su mdico con respecto a filtrar la orina para rescatar el clculo. Vace la vejiga con frecuencia. Evite retener la orina durante largos perodos. Si es mujer: Despus de una deposicin, higiencese desde adelante hacia atrs y use cada trozo de papel higinico por nica vez. Vace la vejiga antes y despus de Clinical biochemist. Est atento a cualquier cambio en los sntomas. Informe al mdico sobre cualquier cambio o sntoma nuevo. Recoger los [employer] de las pruebas es su responsabilidad. Consulte al mdico o pregunte en el departamento donde se realiza la prueba cundo estarn [employer]. Cumpla con todas las visitas de seguimiento. Esto es importante. Comunquese con un mdico si: Siente dolor en la espalda. Tiene fiebre o escalofros. Tiene nuseas o vmitos. Los sntomas no mejoran despus de 3 das. Los sntomas empeoran. Solicite ayuda de inmediato si: Presenta muchos vmitos y no puede tomar los medicamentos sin vomitar. Siente dolor intenso en la espalda o el abdomen, a pesar de [employer]. Tiene una gran cantidad de sangre en la orina. Despide cogulos de [employer]. Se siente muy dbil o como si fuera a desmayarse. Se desmaya. Resumen La hematuria es la presencia de sangre en la orina. Puede tener muchas causas posibles. Es muy importante que le informe a su mdico si ve sangre en la orina, incluso si no tiene dolor o si el sangrado desaparece sin tratamiento. Use los medicamentos de venta libre y los recetados solamente como se lo haya indicado el mdico. Beba suficiente lquido como para Theatre manager la [place] de  color amarillo plido. Esta informacin no tiene Marine scientist el consejo del mdico. Asegrese de hacerle al mdico cualquier pregunta que tenga. Document Revised: 01/13/2020 Document Reviewed: 12/02/2019 Elsevier Patient Education  2022 [employer].
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
## 2021-02-08 NOTE — Progress Notes (Signed)
Urological Symptom Review  Patient is experiencing the following symptoms: Frequent urination   Review of Systems  Gastrointestinal (upper)  : Indigestion/heartburn  Gastrointestinal (lower) : Constipation  Constitutional : Night Sweats Fatigue  Skin: Negative for skin symptoms  Eyes: Negative for eye symptoms  Ear/Nose/Throat : Sinus problems  Hematologic/Lymphatic: Negative for Hematologic/Lymphatic symptoms  Cardiovascular : Leg swelling  Respiratory : Negative for respiratory symptoms  Endocrine: Negative for endocrine symptoms  Musculoskeletal: Back pain  Neurological: Headaches  Psychologic: Negative for psychiatric symptoms

## 2021-03-11 ENCOUNTER — Other Ambulatory Visit: Payer: Self-pay | Admitting: *Deleted

## 2021-03-11 DIAGNOSIS — K2970 Gastritis, unspecified, without bleeding: Secondary | ICD-10-CM

## 2021-03-12 ENCOUNTER — Ambulatory Visit: Payer: PRIVATE HEALTH INSURANCE | Admitting: Gastroenterology

## 2021-03-18 LAB — H. PYLORI ANTIGEN, STOOL: H pylori Ag, Stl: POSITIVE — AB

## 2021-03-31 ENCOUNTER — Encounter: Payer: Self-pay | Admitting: Physician Assistant

## 2021-03-31 ENCOUNTER — Ambulatory Visit: Payer: Self-pay | Admitting: Physician Assistant

## 2021-03-31 ENCOUNTER — Other Ambulatory Visit: Payer: Self-pay

## 2021-03-31 VITALS — BP 124/80 | HR 80 | Temp 97.2°F | Wt 150.0 lb

## 2021-03-31 DIAGNOSIS — Z1239 Encounter for other screening for malignant neoplasm of breast: Secondary | ICD-10-CM

## 2021-03-31 DIAGNOSIS — Z789 Other specified health status: Secondary | ICD-10-CM

## 2021-03-31 DIAGNOSIS — I10 Essential (primary) hypertension: Secondary | ICD-10-CM

## 2021-03-31 MED ORDER — LOSARTAN POTASSIUM 50 MG PO TABS: 50.0000 mg | ORAL_TABLET | Freq: Every day | ORAL | 0 refills | Status: DC

## 2021-03-31 MED ORDER — OMEPRAZOLE 20 MG PO CPDR: DELAYED_RELEASE_CAPSULE | ORAL | 0 refills | Status: DC

## 2021-03-31 MED ORDER — CETIRIZINE HCL 10 MG PO TABS: 10.0000 mg | ORAL_TABLET | Freq: Every day | ORAL | 0 refills | Status: DC | PRN

## 2021-03-31 NOTE — Progress Notes (Signed)
BP 124/80    Pulse 80    Temp (!) 97.2 F (36.2 C)    Wt 150 lb (68 kg)    SpO2 98%    BMI 29.29 kg/m    Subjective:    Patient ID: Joyce Roberts, female    DOB: 1970-09-10, 51 y.o.   MRN: [id number]  HPI: Joyce Roberts is a 51 y.o. female presenting on 03/31/2021 for Hypertension   HPI  Chief Complaint  Patient presents with   Hypertension    Pt is 50yoF with routine appointment.  She is seeing GI for h pylori and is supposed to get tested again in February.  Pt has no new complaints today.  Relevant past medical, surgical, family and social history reviewed and updated as indicated. Interim medical history since our last visit reviewed. Allergies and medications reviewed and updated.   Current Outpatient Medications:    cetirizine (ZYRTEC) 10 MG tablet, Take 1 tablet (10 mg total) by mouth daily as needed for allergies. Tome una tableta por boca diaria cuando sea necesario para las alergias, Disp: 90 tablet, Rfl: 0   fluticasone (FLONASE) 50 MCG/ACT nasal spray, Place 2 sprays into both nostrils daily. pongase 2 sprays en los dos oyos de la [place], Disp: 16 g, Rfl: 0   losartan (COZAAR) 50 MG tablet, Take 1 tablet (50 mg total) by mouth daily. Tome una tableta por boca diaria, Disp: 90 tablet, Rfl: 0   mirabegron ER (MYRBETRIQ) 25 MG TB24 tablet, Take 25 mg by mouth daily., Disp: , Rfl:    omeprazole (PRILOSEC) 20 MG capsule, 1 PO BID. 1 POR BOCA DOS VECES AL DIA, Disp: 180 capsule, Rfl: 0    Review of Systems  Per HPI unless specifically indicated above     Objective:    BP 124/80    Pulse 80    Temp (!) 97.2 F (36.2 C)    Wt 150 lb (68 kg)    SpO2 98%    BMI 29.29 kg/m   Wt Readings from Last 3 Encounters:  03/31/21 150 lb (68 kg)  02/03/21 153 lb 6.4 oz (69.6 kg)  11/19/20 149 lb (67.6 kg)    Physical Exam Vitals reviewed.  Constitutional:      General: She is not in acute distress.    Appearance: She is well-developed. She is not ill-appearing.   HENT:     Head: Normocephalic and atraumatic.  Cardiovascular:     Rate and Rhythm: Normal rate and regular rhythm.  Pulmonary:     Effort: Pulmonary effort is normal.     Breath sounds: Normal breath sounds.  Abdominal:     General: Bowel sounds are normal.     Palpations: Abdomen is soft. There is no mass.     Tenderness: There is no abdominal tenderness.  Musculoskeletal:     Cervical back: Neck supple.     Right lower leg: No edema.     Left lower leg: No edema.  Lymphadenopathy:     Cervical: No cervical adenopathy.  Skin:    General: Skin is warm and dry.  Neurological:     Mental Status: She is alert and oriented to person, place, and time.  Psychiatric:        Behavior: Behavior normal.          Assessment & Plan:    Encounter Diagnoses  Name Primary?   Primary hypertension Yes   Non-English speaking patient    Encounter for screening for  malignant neoplasm of breast, unspecified screening modality       -pt to Continue losartan for htn -will refer for routine screening mammogram -pt to continue with GI per their recommendations -pt to follow up here 4 months.  She is to contact pt sooner prn

## 2021-04-05 ENCOUNTER — Other Ambulatory Visit: Payer: Self-pay

## 2021-04-05 DIAGNOSIS — Z1231 Encounter for screening mammogram for malignant neoplasm of breast: Secondary | ICD-10-CM

## 2021-04-12 ENCOUNTER — Ambulatory Visit (HOSPITAL_COMMUNITY)
Admission: RE | Admit: 2021-04-12 | Discharge: 2021-04-12 | Disposition: A | Discharge status: Home / Self Care | Payer: Self-pay | Source: Ambulatory Visit | Attending: Physician Assistant | Admitting: Physician Assistant

## 2021-04-12 ENCOUNTER — Other Ambulatory Visit: Payer: Self-pay

## 2021-04-12 DIAGNOSIS — Z1231 Encounter for screening mammogram for malignant neoplasm of breast: Secondary | ICD-10-CM | POA: Insufficient documentation

## 2021-04-22 ENCOUNTER — Other Ambulatory Visit: Payer: Self-pay | Admitting: Physician Assistant

## 2021-05-10 ENCOUNTER — Ambulatory Visit: Payer: Self-pay | Admitting: Physician Assistant

## 2021-05-10 ENCOUNTER — Encounter: Payer: Self-pay | Admitting: Physician Assistant

## 2021-05-10 VITALS — BP 137/87 | HR 78 | Temp 96.6°F

## 2021-05-10 DIAGNOSIS — R300 Dysuria: Secondary | ICD-10-CM

## 2021-05-10 DIAGNOSIS — Z789 Other specified health status: Secondary | ICD-10-CM

## 2021-05-10 DIAGNOSIS — N3090 Cystitis, unspecified without hematuria: Secondary | ICD-10-CM

## 2021-05-10 LAB — POCT URINALYSIS DIPSTICK
Glucose, UA: NEGATIVE
Ketones, UA: NEGATIVE
Leukocytes, UA: NEGATIVE
Nitrite, UA: NEGATIVE
Protein, UA: POSITIVE — AB
Spec Grav, UA: >=1.03 — AB (ref 1.010–1.025)
Urobilinogen, UA: 0.2 E.U./dL
pH, UA: 5.5 (ref 5.0–8.0)

## 2021-05-10 MED ORDER — FLUCONAZOLE 150 MG PO TABS: 150.0000 mg | ORAL_TABLET | Freq: Once | ORAL | 0 refills | Status: AC

## 2021-05-10 MED ORDER — CEPHALEXIN 500 MG PO CAPS: 500.0000 mg | ORAL_CAPSULE | Freq: Four times a day (QID) | ORAL | 0 refills | Status: DC

## 2021-05-10 NOTE — Patient Instructions (Signed)
Infeccin urinaria en los adultos Urinary Tract Infection, Adult Una infeccin urinaria (IU) puede ocurrir en Clinical cytogeneticist de las vas [place]. Las vas urinarias incluyen a los riones, los urteres, la vejiga y Geologist, engineering. Estos rganos fabrican, Buyer, retail y eliminan la orina del organismo. La IU alta afecta los urteres y los riones. La IU baja afecta la vejiga y Geologist, engineering. Cules son las causas? La mayora de las infecciones de las vas urinarias es causada por la presencia de bacterias en la zona genital, alrededor de la uretra, por donde sale la orina del cuerpo. Estas bacterias proliferan y causan inflamacin en las vas urinarias. Qu incrementa el riesgo? Es ms probable que sufra esta afeccin si: Tiene colocado un catter [employer]. No puede controlar cundo Production assistant, radio (incontinencia). Es mujer y usted: [place] espermicida o diafragma como mtodo anticonceptivo. Tiene niveles bajos de estrgenos. Est embarazada. Tiene ciertos genes que [employer]. Es sexualmente activa. Toma antibiticos. Tiene una afeccin que causa que el flujo de orina sea [place], como: Prstata agrandada, si usted es hombre. Obstruccin de Geologist, engineering. Clculo renal. Una afeccin nerviosa que afecta el control de la vejiga (vejiga neurgena). No bebe lo suficiente o no orina con frecuencia. Tiene ciertas enfermedades crnicas, como: Diabetes. Un sistema que combate las enfermedades (sistemainmunitario) debilitado. Anemia drepanoctica. Gota. Lesin en la mdula espinal. Cules son los signos o sntomas? Los sntomas de esta afeccin incluyen: Necesidad inmediata (urgencia) de Garment/textile technologist. Miccin frecuente. Esto puede incluir pequeas cantidades de [place] cada vez que [place]. Ardor o dolor al [employer]. Presencia de [employer]. Orina con mal olor u [employer] atpico. Dificultad para Garment/textile technologist. Bennie Hind turbia. Secrecin vaginal, si es mujer. Dolor en el abdomen o en la parte  inferior de la espalda. Es posible que tambin tenga: Vmitos o disminucin del apetito. Confusin. Irritabilidad o cansancio. Fiebre o escalofros. Diarrea. El Psychologist, educational sntoma en los adultos mayores puede ser la confusin. En algunos casos, es posible que no tengan sntomas hasta que la infeccin empeore. Cmo se diagnostica? Esta afeccin se diagnostica en funcin de sus antecedentes mdicos y de un examen fsico. Tambin pueden hacerle otras pruebas, incluidas las siguientes: Anlisis de [place]. Anlisis de [place]. Pruebas de infecciones de transmisin sexual (ITS). Si ha tenido ms de una infeccin urinaria (IU), se pueden hacer estudios de diagnstico por imgenes o una cistoscopia para determinar la causa de las infecciones. Cmo se trata? El tratamiento de esta afeccin incluye lo siguiente: Antibiticos. Medicamentos de [place] molestias. Beber una cantidad suficiente agua para mantenerse hidratado. Si tiene infecciones con frecuencia o tiene otras afecciones, como un clculo renal, es posible que deba ver a un mdico especialista en las vas urinarias (urlogo). En casos poco frecuentes, las infecciones urinarias pueden provocar sepsis. La sepsis es una afeccin potencialmente mortal que se produce cuando el cuerpo responde a una infeccin. La sepsis se trata en el hospital con antibiticos, lquidos y otros medicamentos que se administran por va intravenosa. Siga estas instrucciones en su casa: Medicamentos Use los medicamentos de venta libre y los recetados solamente como se lo haya indicado el mdico. Si le recetaron un antibitico, tmelo como se lo haya indicado el mdico. No deje de usar el antibitico aunque comience a Sports administrator. Instrucciones generales Asegrese de hacer lo siguiente: Vaciar la vejiga con frecuencia y en su totalidad. No contener la orina durante largos perodos. Vaciar la vejiga despus de Adult nurse. Limpiarse de atrs hacia  adelante despus de  orinar o defecar, si es [place]. Usar cada trozo de papel higinico solo una vez cuando se limpie. Beber suficiente lquido como para Theatre manager la orina de color amarillo plido. Cumpla con todas las visitas de seguimiento. Esto es importante. Comunquese con un mdico si: Los sntomas no mejoran despus de 1 o 2 das de [place]. Los sntomas desaparecen y luego vuelven a Arts administrator. Solicite ayuda de inmediato si: Siente dolor intenso en la espalda o en la parte inferior del abdomen. Tiene fiebre o escalofros. Tiene nuseas o vmitos. Resumen Una infeccin urinaria (IU) es una infeccin en cualquier parte de las vas urinarias, que [employer] riones, los urteres, la vejiga y Geologist, engineering. [place] infecciones de las vas urinarias es causada por bacterias en la zona genital. El tratamiento de esta afeccin suele incluir antibiticos. Si le recetaron un antibitico, tmelo como se lo haya indicado el mdico. No deje de usar el antibitico aunque comience a Sports administrator. Cumpla con todas las visitas de seguimiento. Esto es importante. Esta informacin no tiene Marine scientist el consejo del mdico. Asegrese de hacerle al mdico cualquier pregunta que tenga. Document Revised: 01/06/2020 Document Reviewed: 01/06/2020 Elsevier Patient Education  2022 [employer].

## 2021-05-10 NOTE — Progress Notes (Signed)
° °  There were no vitals taken for this visit.   Subjective:    Patient ID: Joyce Roberts, female    DOB: [birth year], 51 y.o.   MRN: [id number]  HPI: [name] is a 51 y.o. female presenting on 05/10/2021 for No chief complaint on file.   HPI   Urinary symptoms started Thursday afternoon.  She is having burning.   She had chills on Friday.      Relevant past medical, surgical, family and social history reviewed and updated as indicated. Interim medical history since our last visit reviewed. Allergies and medications reviewed and updated.   Current Outpatient Medications:    cetirizine (ZYRTEC) 10 MG tablet, Take 1 tablet (10 mg total) by mouth daily as needed for allergies. Tome una tableta por boca diaria cuando sea necesario para las alergias, Disp: 90 tablet, Rfl: 0   fluticasone (FLONASE) 50 MCG/ACT nasal spray, USE 2 SPRAYS IN EACH NOSTRIL EVERY DAY, Disp: 9.9 mL, Rfl: 1   losartan (COZAAR) 50 MG tablet, Take 1 tablet (50 mg total) by mouth daily. Tome una tableta por boca diaria, Disp: 90 tablet, Rfl: 0   omeprazole (PRILOSEC) 20 MG capsule, 1 PO BID. 1 POR BOCA DOS VECES AL DIA, Disp: 180 capsule, Rfl: 0   mirabegron ER (MYRBETRIQ) 25 MG TB24 tablet, Take 25 mg by mouth daily. (Patient not taking: Reported on 05/10/2021), Disp: , Rfl:    Review of Systems  Per HPI unless specifically indicated above     Objective:     Vitals:   05/10/21 1311  BP: 137/87  Pulse: 78  Temp: (!) 96.6 F (35.9 C)  SpO2: 97%       There were no vitals taken for this visit.  Wt Readings from Last 3 Encounters:  03/31/21 150 lb (68 kg)  02/03/21 153 lb 6.4 oz (69.6 kg)  11/19/20 149 lb (67.6 kg)    Physical Exam Vitals reviewed.  Constitutional:      General: She is not in acute distress.    Appearance: She is well-developed. She is not toxic-appearing.  HENT:     Head: Normocephalic and atraumatic.  Cardiovascular:     Rate and Rhythm: Normal rate and regular  rhythm.  Pulmonary:     Effort: Pulmonary effort is normal.     Breath sounds: Normal breath sounds.  Abdominal:     General: Bowel sounds are normal.     Palpations: Abdomen is soft. There is no mass.     Tenderness: There is no abdominal tenderness. There is no right CVA tenderness, left CVA tenderness, guarding or rebound.  Musculoskeletal:     Cervical back: Neck supple.     Right lower leg: No edema.     Left lower leg: No edema.  Lymphadenopathy:     Cervical: No cervical adenopathy.  Skin:    General: Skin is warm and dry.  Neurological:     Mental Status: She is alert and oriented to person, place, and time.  Psychiatric:        Behavior: Behavior normal.          Assessment & Plan:   Encounter Diagnoses  Name Primary?   Cystitis Yes   Dysuria    Non-English speaking patient       UA performed. Rx keflex Reading information on UTI provided Pt requests diflucan which is also given Follow up as scheduled

## 2021-05-20 ENCOUNTER — Encounter: Payer: Self-pay | Admitting: Internal Medicine

## 2021-07-14 ENCOUNTER — Ambulatory Visit: Payer: Self-pay | Admitting: Physician Assistant

## 2021-07-14 ENCOUNTER — Encounter: Payer: Self-pay | Admitting: Physician Assistant

## 2021-07-14 DIAGNOSIS — J069 Acute upper respiratory infection, unspecified: Secondary | ICD-10-CM

## 2021-07-14 DIAGNOSIS — Z789 Other specified health status: Secondary | ICD-10-CM

## 2021-07-14 MED ORDER — PSEUDOEPHEDRINE HCL ER 120 MG PO TB12: 120.0000 mg | ORAL_TABLET | Freq: Two times a day (BID) | ORAL | 0 refills | Status: DC

## 2021-07-14 NOTE — Progress Notes (Signed)
? ?There were no vitals taken for this visit.  ? ?Subjective:  ? ? Patient ID: Joyce Roberts, female    DOB: Apr 02, 1970, 51 y.o.   MRN: [id number] ? ?HPI: ?Joyce Roberts is a 51 y.o. female presenting on 07/14/2021 for No chief complaint on file. ? ? ?HPI ? ? ?This is a telemedicine appointment via telephone.  Pt says she doesn't have good enough signal to have a video appointment. ? ?I connected with  Joyce Roberts on 07/14/21 by a video enabled telemedicine application and verified that I am speaking with the correct person using two identifiers. ?  ?I discussed the limitations of evaluation and management by telemedicine. The patient expressed understanding and agreed to proceed. ? ?Pt is at home.  Provider and translator are at office.   ? ? ?Pt is sick.  Pt says on Saturday started with fever.  She has HA and ST and chills.  She feels like she is getting worse.  The fever is getting better but the HA and ST is not.  Temp this morning 99.  She denies cough but says she has a lot of congestion and phlegm.    She is taking APAP and IBU and zyrtec.    When asked which part of her head she says all over but maybe more on her forehead and face.    She has a little bit of runny nose.  She is blowing her nose a lot.  She says sometimes her ears hurt as well.   She is not taking other meds for her URI.  She says she has been garlging with warm salt water.   ? ?She took a covid test yesterday and says it was negative.   ? ?She says she is allergic to guaifenesin- it makes her heart go fast.  She says she is not allergic to dextromethorphan.   ? ?Pt noted to have cough despite denying that as a symptom.   When asked about her cough, she admits that yes she has some cough as well.   ? ? ? ? ?Relevant past medical, surgical, family and social history reviewed and updated as indicated. Interim medical history since our last visit reviewed. ?Allergies and medications reviewed and updated. ? ? ?Current Outpatient  Medications:  ?  cetirizine (ZYRTEC) 10 MG tablet, Take 1 tablet (10 mg total) by mouth daily as needed for allergies. Tome una tableta por boca diaria cuando sea necesario para las [place], Disp: 90 tablet, Rfl: 0 ?  fluticasone (FLONASE) 50 MCG/ACT nasal spray, USE 2 SPRAYS IN EACH NOSTRIL EVERY DAY, Disp: 9.9 mL, Rfl: 1 ?  losartan (COZAAR) 50 MG tablet, Take 1 tablet (50 mg total) by mouth daily. Tome una tableta por boca diaria, Disp: 90 tablet, Rfl: 0 ?  omeprazole (PRILOSEC) 20 MG capsule, 1 PO BID. 1 POR BOCA DOS VECES AL DIA, Disp: 180 capsule, Rfl: 0 ?  mirabegron ER (MYRBETRIQ) 25 MG TB24 tablet, Take 25 mg by mouth daily. (Patient not taking: Reported on 05/10/2021), Disp: , Rfl:  ? ? ? ?Review of Systems ? ?Per HPI unless specifically indicated above ? ?   ?Objective:  ?  ?There were no vitals taken for this visit.  ?Wt Readings from Last 3 Encounters:  ?03/31/21 150 lb (68 kg)  ?02/03/21 153 lb 6.4 oz (69.6 kg)  ?11/19/20 149 lb (67.6 kg)  ?  ?Physical Exam ?HENT:  ?   Nose:  ?   Comments: Pt talks  with congested voice and sniffles frequently ?Pulmonary:  ?   Effort: No respiratory distress.  ?   Comments: Pt is talking in complete sentences without dyspnea.  She is heard to have a dry cough. ?Neurological:  ?   Mental Status: She is alert and oriented to person, place, and time.  ?Psychiatric:     ?   Attention and Perception: Attention normal.     ?   Speech: Speech normal.     ?   Behavior: Behavior is cooperative.  ? ? ? ? ? ?   ?Assessment & Plan:  ? ?Encounter Diagnoses  ?Name Primary?  ? Upper respiratory tract infection, unspecified type Yes  ? Non-English speaking patient   ? ? ? ? ?-pt is counseled to rest, drink plenty fluids, continue warm salt water gargles ?-she can continue to use zyrtec, APAP, IBU as needed ?-Rx sudafed to [facility] [place].   ?-this illness that started 3-4 days ago and is improving is viral and does not require antibiotics ?-Pt to notify office Monday if not  better/well. ?-pt is notified that her enrollment is expired and she will need to do this before her appointment 07/28/21.  She is asked to wait to contact [facility] until her illness has resolved.  ? ?

## 2021-07-19 ENCOUNTER — Telehealth: Payer: Self-pay | Admitting: Physician Assistant

## 2021-07-19 NOTE — Telephone Encounter (Signed)
Patient called and said she still having a sore throat. Patient was adviced  to use an extra pillow to help with congestion. Pt need to to do another COVID test. Pt can keep taking tylenol and iboprofen to help with fever and soretroat ?

## 2021-07-26 ENCOUNTER — Ambulatory Visit (HOSPITAL_COMMUNITY)
Admission: RE | Admit: 2021-07-26 | Discharge: 2021-07-26 | Disposition: A | Discharge status: Home / Self Care | Payer: Self-pay | Source: Ambulatory Visit | Attending: Urology | Admitting: Urology

## 2021-07-26 DIAGNOSIS — R310 Gross hematuria: Secondary | ICD-10-CM | POA: Insufficient documentation

## 2021-07-28 ENCOUNTER — Ambulatory Visit: Payer: Self-pay | Admitting: Physician Assistant

## 2021-08-11 ENCOUNTER — Ambulatory Visit (INDEPENDENT_AMBULATORY_CARE_PROVIDER_SITE_OTHER): Payer: Self-pay | Admitting: Urology

## 2021-08-11 ENCOUNTER — Encounter: Payer: Self-pay | Admitting: Urology

## 2021-08-11 VITALS — BP 145/90 | HR 76

## 2021-08-11 DIAGNOSIS — R310 Gross hematuria: Secondary | ICD-10-CM

## 2021-08-11 DIAGNOSIS — R350 Frequency of micturition: Secondary | ICD-10-CM

## 2021-08-11 LAB — URINALYSIS, ROUTINE W REFLEX MICROSCOPIC
Bilirubin, UA: NEGATIVE
Glucose, UA: NEGATIVE
Leukocytes,UA: NEGATIVE
Nitrite, UA: NEGATIVE
Protein,UA: NEGATIVE
Specific Gravity, UA: >1.03 — ABNORMAL HIGH (ref 1.005–1.030)
Urobilinogen, Ur: 0.2 mg/dL (ref 0.2–1.0)
pH, UA: 5.5 (ref 5.0–7.5)

## 2021-08-11 LAB — MICROSCOPIC EXAMINATION
Renal Epithel, UA: NONE SEEN /hpf
WBC, UA: NONE SEEN /hpf (ref 0–5)

## 2021-08-11 MED ORDER — LOSARTAN POTASSIUM 50 MG PO TABS
50.0000 mg | ORAL_TABLET | Freq: Every day | ORAL | 0 refills | Status: DC
Start: 2021-08-11 — End: 2022-09-13

## 2021-08-11 MED ORDER — MIRABEGRON ER 25 MG PO TB24
25.0000 mg | ORAL_TABLET | Freq: Every day | ORAL | 0 refills | Status: DC
Start: 2021-08-11 — End: 2021-12-21

## 2021-08-11 NOTE — Patient Instructions (Signed)
Vejiga hiperactiva en adultos Overactive Bladder, Adult  Vejiga hiperactiva es una afeccin en la que la persona tiene una necesidad sbita y frecuente de orinar. La persona tambin podra tener una prdida de orina si no puede llegar al bao con la rapidez suficiente (incontinencia urinaria). A veces, los sntomas pueden interferir en el trabajo o las actividades sociales. Cules son las causas? La vejiga hiperactiva est asociada con seales nerviosas deficientes entre la vejiga y el cerebro. La vejiga puede recibir la seal de vaciarse antes de que est llena. Usted tambin puede tener msculos muy sensibles que hacen que la vejiga se contraiga demasiado pronto. Esta afeccin tambin puede deberse a otros factores, como los siguientes: Afecciones mdicas: Infeccin de las vas urinarias. Infeccin de los tejidos cercanos. Agrandamiento de la prstata. Clculos en la vejiga, inflamacin o tumores. Diabetes. Debilidad de los msculos o nervios, especialmente a causa de estas afecciones: Lesin en la mdula espinal. Accidente cerebrovascular. Esclerosis mltiple. Enfermedad de Parkinson. Otras causas: Ciruga en el tero o la uretra. Consumir cafena o alcohol en exceso. Ciertos medicamentos, especialmente aquellos que eliminan el exceso de lquido del cuerpo (diurticos). Estreimiento. Qu incrementa el riesgo? Puede correr un mayor riesgo de desarrollar vejiga hiperactiva si usted: Es un adulto mayor. Fuma. Est atravesando la menopausia. Tiene problemas de prstata. Tiene una enfermedad neurolgica, como accidente cerebrovascular, demencia, enfermedad de Parkinson o esclerosis mltiple (EM). Come o toma alcohol, alimentos picantes, cafena y otras cosas que irritan la vejiga. Tiene sobrepeso u obesidad. Cules son los signos o sntomas? Los sntomas de esta afeccin incluyen una necesidad repentina y fuerte de orinar. Otros sntomas pueden incluir los siguientes: Prdida de  orina. Orina 8 o ms veces por da. Levantarse para orinar 2 o ms veces durante la noche. Cmo se diagnostica? Esta afeccin se puede diagnosticar en funcin de lo siguiente: Los sntomas y los antecedentes mdicos. Un examen fsico. Anlisis de sangre o de orina para detectar posibles causas, como una infeccin. Es posible que tambin tenga que consultar a un mdico especialista en problemas de las vas urinarias. Este mdico es un urlogo. Cmo se trata? El tratamiento para el trastorno de vejiga hiperactiva depende de la causa y la gravedad de su enfermedad. El tratamiento puede incluir: Entrenamiento de la vejiga, por ejemplo: Aprender a controlar la necesidad urgente de orinar siguiendo un programa para orinar en intervalos regulares. Hacer ejercicios de Kegel para fortalecer los msculos del piso plvico que sostienen la vejiga. Dispositivos especiales, por ejemplo: Biorretroalimentacin. Utiliza sensores para ayudarlo a estar atento a las seales del cuerpo. Estimulacin elctrica. Utiliza electrodos que se colocan dentro del cuerpo (implantados) o fuera del cuerpo. Estos electrodos envan pulsos elctricos suaves para fortalecer los nervios o los msculos que controlan la vejiga. Las mujeres pueden utilizar un dispositivo de plstico, llamado pesario, que calza en la vagina y sostiene la vejiga. Medicamentos, como por ejemplo: Antibiticos para tratar las infecciones en la vejiga. Antiespasmdicos para evitar que la vejiga elimine orina en el momento incorrecto. Antidepresivos tricclicos para relajar los msculos de la vejiga. Inyecciones de toxina botulnica tipo A directamente en el tejido de la vejiga para relajar los msculos de la vejiga. Ciruga, como: Puede implantarse un dispositivo para ayudar a controlar las seales nerviosas que controlan la miccin. Puede implantarse un electrodo para estimular las seales elctricas en la vejiga. Puede realizarse un procedimiento  para cambiar la forma de la vejiga. Esto solo se realiza en casos muy graves. Siga estas instrucciones en su casa: Comida y   bebida  Haga cambios en su estilo de vida o dieta segn las recomendaciones de su mdico. Estos pueden incluir: Beber lquidos a lo largo del da y no solo con las comidas. Reducir la ingesta de cafena o alcohol. Ingerir una dieta saludable y equilibrada para evitar el estreimiento. Esto puede incluir: Elegir alimentos ricos en fibra, como frijoles, cereales integrales y frutas y verduras frescas. Limitar el consumo de alimentos ricos en grasas y azcares procesados, como alimentos fritos o dulces. Estilo de vida  Baje de peso, si es necesario. No consuma ningn producto que contenga nicotina o tabaco. Estos incluyen cigarrillos, tabaco para mascar y aparatos de vapeo, como los cigarrillos electrnicos. Si necesita ayuda para dejar de consumir estos productos, consulte al mdico. Indicaciones generales Use los medicamentos de venta libre y los recetados solamente como se lo haya indicado el mdico. Si le recetaron un antibitico, tmelo como se lo haya indicado el mdico. No deje de tomar el antibitico aunque comience a sentirse mejor. Use los implantes o el pesario como se lo haya indicado el mdico. Si es necesario, use apsitos para absorber cualquier prdida de orina que pueda tener. Lleve un diario para registrar la cantidad de lquidos que ingiere y cundo lo hace, y cundo necesita orinar. Esto ayudar a su mdico a controlar su enfermedad. Cumpla con todas las visitas de seguimiento. Esto es importante. Comunquese con un mdico si: Tiene fiebre o escalofros. Los sntomas no mejoran con el tratamiento. El dolor y las molestias empeoran. Tiene necesidad urgente de orinar con mayor frecuencia. Solicite ayuda de inmediato si: No puede controlar la vejiga. Resumen Vejiga hiperactiva se refiere a una afeccin en la que la persona tiene una necesidad sbita y  frecuente de orinar. Varias afecciones pueden causar sntomas de vejiga hiperactiva. El tratamiento para la vejiga hiperactiva depende de la causa y la gravedad de la afeccin. Hacer cambios en el estilo de vida, hacer ejercicios de Kegel, llevar un diario y tomar medicamentos puede ayudar con esta afeccin. Esta informacin no tiene como fin reemplazar el consejo del mdico. Asegrese de hacerle al mdico cualquier pregunta que tenga. Document Revised: 01/17/2020 Document Reviewed: 12/24/2019 Elsevier Patient Education  2023 Elsevier Inc.  
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
## 2021-11-10 ENCOUNTER — Ambulatory Visit (INDEPENDENT_AMBULATORY_CARE_PROVIDER_SITE_OTHER): Payer: Self-pay | Admitting: Urology

## 2021-11-10 ENCOUNTER — Encounter: Payer: Self-pay | Admitting: Urology

## 2021-11-10 VITALS — BP 138/83 | HR 73

## 2021-11-10 DIAGNOSIS — R350 Frequency of micturition: Secondary | ICD-10-CM

## 2021-11-10 DIAGNOSIS — R310 Gross hematuria: Secondary | ICD-10-CM

## 2021-11-10 NOTE — Addendum Note (Signed)
Addended by: Audie Box on: 11/10/2021 11:47 AM   Modules accepted: Orders

## 2021-11-10 NOTE — Patient Instructions (Signed)
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
## 2021-11-11 LAB — URINALYSIS, ROUTINE W REFLEX MICROSCOPIC
Bilirubin, UA: NEGATIVE
Glucose, UA: NEGATIVE
Ketones, UA: NEGATIVE
Leukocytes,UA: NEGATIVE
Nitrite, UA: NEGATIVE
Specific Gravity, UA: 1.025 (ref 1.005–1.030)
Urobilinogen, Ur: 0.2 mg/dL (ref 0.2–1.0)
pH, UA: 5.5 (ref 5.0–7.5)

## 2021-11-11 LAB — MICROSCOPIC EXAMINATION: Bacteria, UA: NONE SEEN

## 2021-12-07 ENCOUNTER — Ambulatory Visit: Payer: Self-pay | Admitting: Physician Assistant

## 2021-12-21 ENCOUNTER — Ambulatory Visit (INDEPENDENT_AMBULATORY_CARE_PROVIDER_SITE_OTHER): Payer: Self-pay | Admitting: Physician Assistant

## 2021-12-21 VITALS — BP 137/84 | HR 73

## 2021-12-21 DIAGNOSIS — R109 Unspecified abdominal pain: Secondary | ICD-10-CM

## 2021-12-21 DIAGNOSIS — K5900 Constipation, unspecified: Secondary | ICD-10-CM

## 2021-12-21 DIAGNOSIS — N809 Endometriosis, unspecified: Secondary | ICD-10-CM

## 2021-12-21 DIAGNOSIS — R350 Frequency of micturition: Secondary | ICD-10-CM

## 2021-12-21 LAB — BLADDER SCAN AMB NON-IMAGING: Scan Result: 0

## 2021-12-21 MED ORDER — MIRABEGRON ER 50 MG PO TB24: 50.0000 mg | ORAL_TABLET | Freq: Every day | ORAL | 0 refills | Status: DC

## 2021-12-21 MED ORDER — MIRABEGRON ER 50 MG PO TB24: 50.0000 mg | ORAL_TABLET | Freq: Every day | ORAL | 2 refills | Status: DC
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
## 2021-12-21 NOTE — Patient Instructions (Addendum)
MiraLAX daily  Myrbetriq increase to '50mg'$ /day.

## 2021-12-21 NOTE — Progress Notes (Signed)
post void residual=0 ?

## 2021-12-22 LAB — URINALYSIS, ROUTINE W REFLEX MICROSCOPIC
Bilirubin, UA: NEGATIVE
Glucose, UA: NEGATIVE
Ketones, UA: NEGATIVE
Leukocytes,UA: NEGATIVE
Nitrite, UA: NEGATIVE
Protein,UA: NEGATIVE
Specific Gravity, UA: 1.025 (ref 1.005–1.030)
Urobilinogen, Ur: 0.2 mg/dL (ref 0.2–1.0)
pH, UA: 5 (ref 5.0–7.5)

## 2021-12-22 LAB — MICROSCOPIC EXAMINATION: Bacteria, UA: NONE SEEN

## 2022-02-03 ENCOUNTER — Encounter: Payer: Self-pay | Admitting: Obstetrics & Gynecology

## 2022-02-25 ENCOUNTER — Ambulatory Visit: Payer: Self-pay | Admitting: Physician Assistant

## 2022-03-15 ENCOUNTER — Ambulatory Visit: Payer: Self-pay | Admitting: Physician Assistant

## 2022-05-10 IMAGING — US US BREAST*L* LIMITED INC AXILLA
1 series · 11 of 11 positions shown · non-contrast
Comparison: Previous exam(s).

CLINICAL DATA: Patient with several week history of spontaneous
bloody left nipple discharge. She has a history of a previous
retroareolar left breast benign biopsy which revealed pathology
consistent with a ruptured cyst.

EXAM:
DIGITAL DIAGNOSTIC UNILATERAL LEFT MAMMOGRAM WITH TOMOSYNTHESIS AND
CAD; ULTRASOUND LEFT BREAST LIMITED
TECHNIQUE: Left digital diagnostic mammography and breast tomosynthesis was
performed. The images were evaluated with computer-aided detection.;
Targeted ultrasound examination of the left breast was performed

[Series 1: us breast*left* limited inc axilla · 0.07mm/px · 11 of 11 slices shown]
[im 1/11]
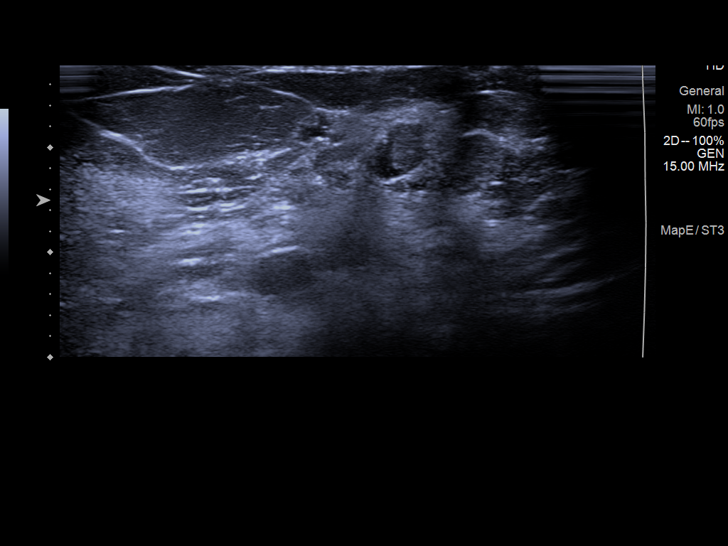
[im 2/11]
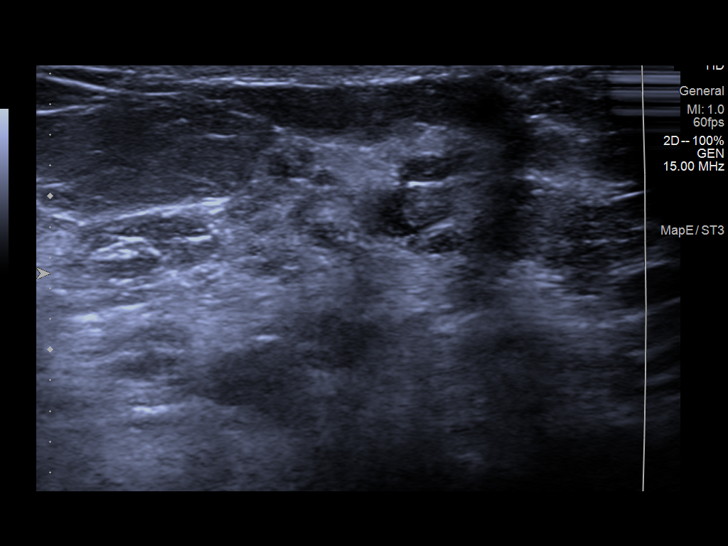
[im 3/11]
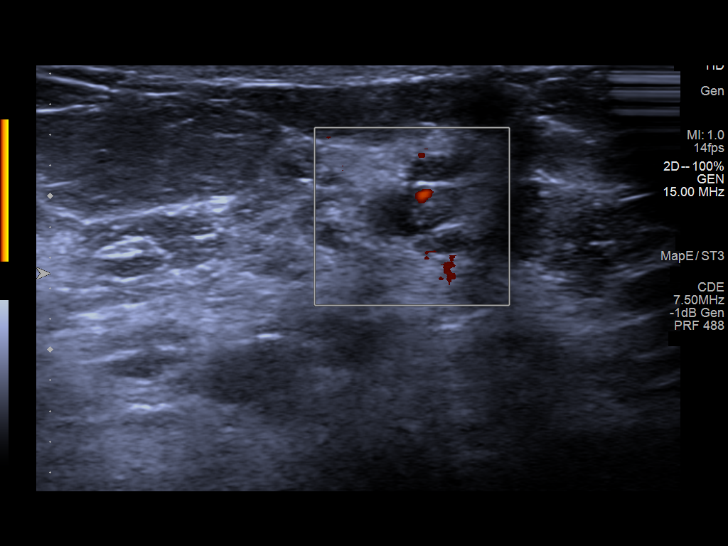
[im 4/11]
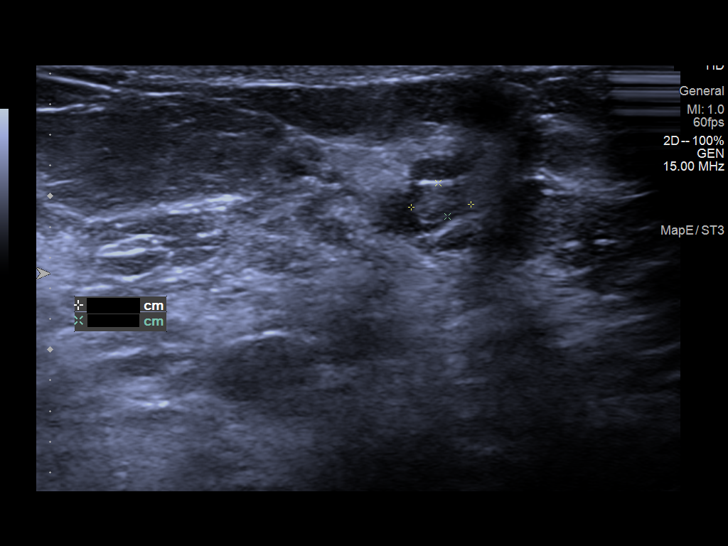
[im 5/11]
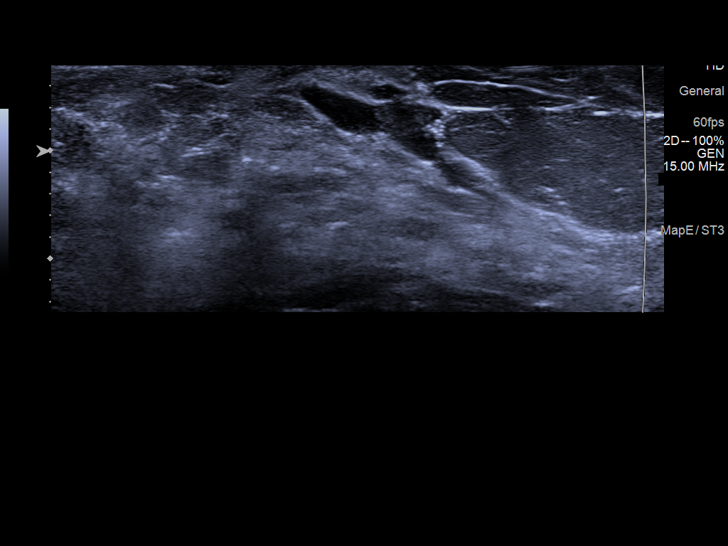
[im 6/11]
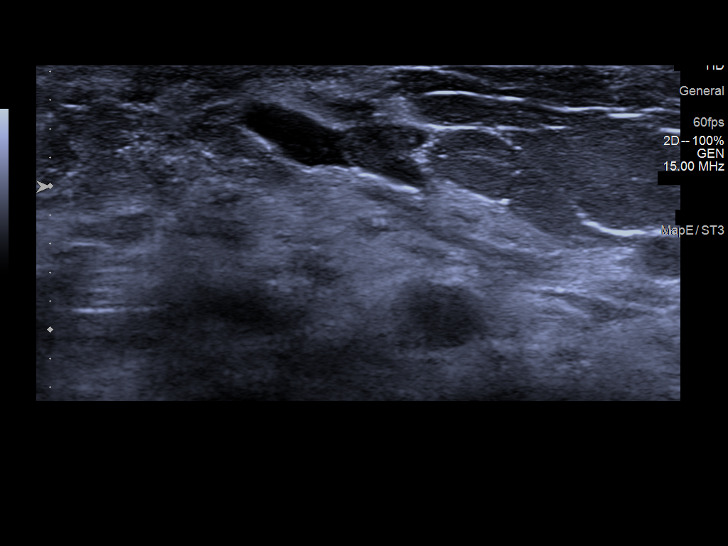
[im 7/11]
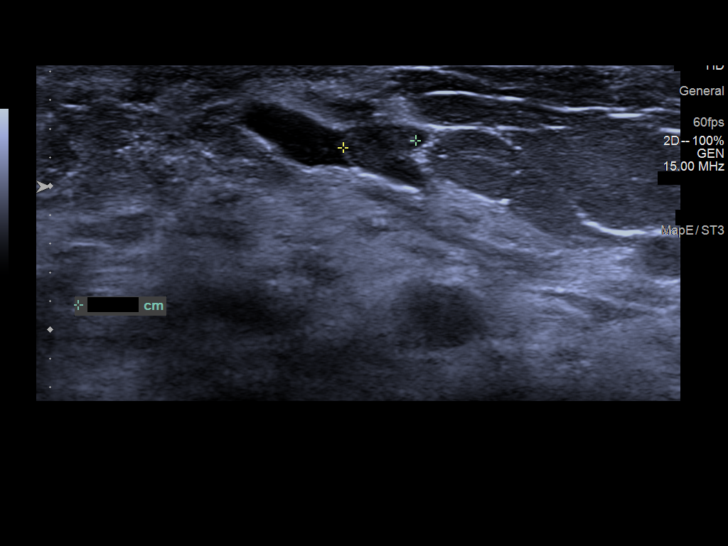
[im 8/11]
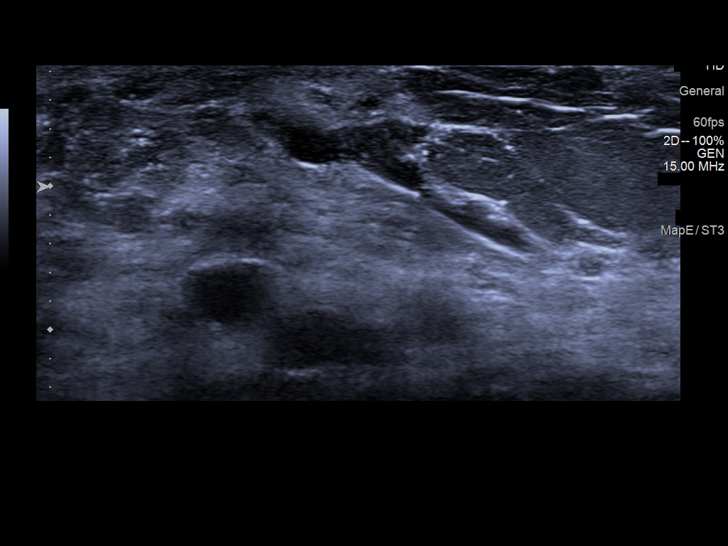
[im 9/11]
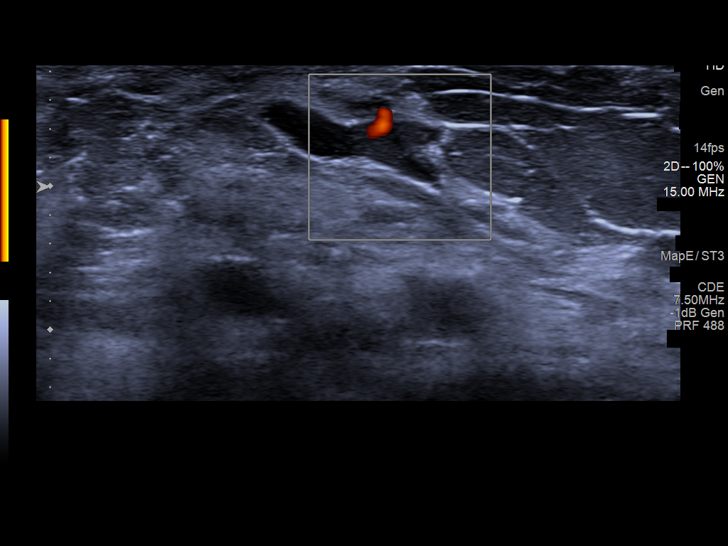
[im 10/11]
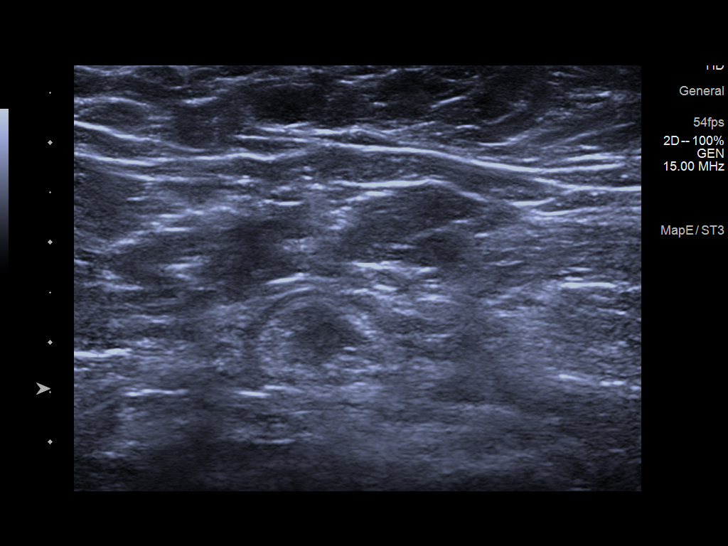
[im 11/11]
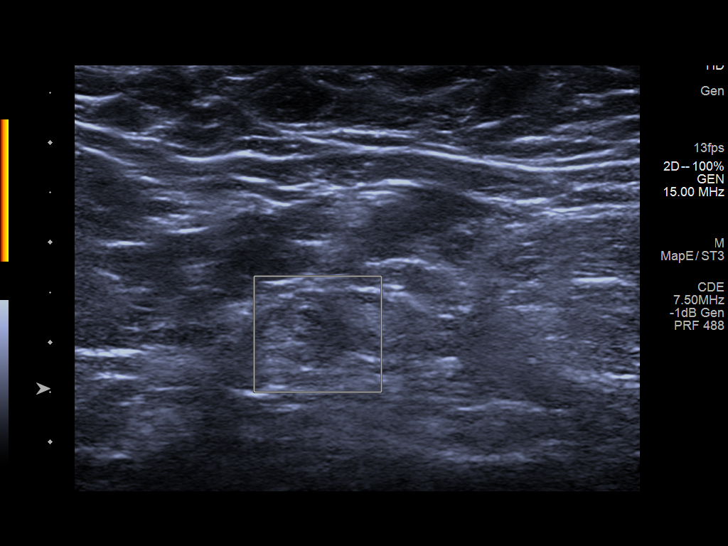

[11 of 11 positions shown; findings below may reference images not displayed]

ACR Breast Density Category c: The breast tissue is heterogeneously
dense, which may obscure small masses.
FINDINGS: There are several round mostly circumscribed masses consistent with
cysts, which have been documented sonography previously.

There are no suspicious masses, no areas of architectural distortion
and no suspicious calcifications. Stable ribbon shaped biopsy clip
lies in the 7 o'clock retroareolar location.

On physical exam, left nipple is partly inverted. Visible discharge
is seen on exam.

Targeted ultrasound is performed, showing a dilated duct containing
an intraductal mass in the left breast retroareolar, slightly
towards 4-5 o'clock, with the mass measuring 5 x 2 x 4 mm. Mass
shows internal blood flow on color Doppler analysis.

Sonographic evaluation of the left axilla shows no enlarged or
abnormal lymph nodes.
IMPRESSION: 1. 5 mm intraductal mass on the left in this patient with
spontaneous bloody left nipple discharge. Tissue sampling is
recommended.

RECOMMENDATION:
1. Ultrasound-guided core needle biopsy small left breast
retroareolar intraductal mass.

I have discussed the findings and recommendations with the patient.
If applicable, a reminder letter will be sent to the patient
regarding the next appointment.

BI-RADS CATEGORY  4: Suspicious.

## 2022-05-10 IMAGING — MG MM DIGITAL DIAGNOSTIC UNILAT*L* W/ TOMO W/ CAD
6 series · 6 of 18 positions shown · non-contrast
Comparison: Previous exam(s).

CLINICAL DATA: Patient with several week history of spontaneous
bloody left nipple discharge. She has a history of a previous
retroareolar left breast benign biopsy which revealed pathology
consistent with a ruptured cyst.

EXAM:
DIGITAL DIAGNOSTIC UNILATERAL LEFT MAMMOGRAM WITH TOMOSYNTHESIS AND
CAD; ULTRASOUND LEFT BREAST LIMITED
TECHNIQUE: Left digital diagnostic mammography and breast tomosynthesis was
performed. The images were evaluated with computer-aided detection.;
Targeted ultrasound examination of the left breast was performed

[L CC synth-2D (1 of 2)]
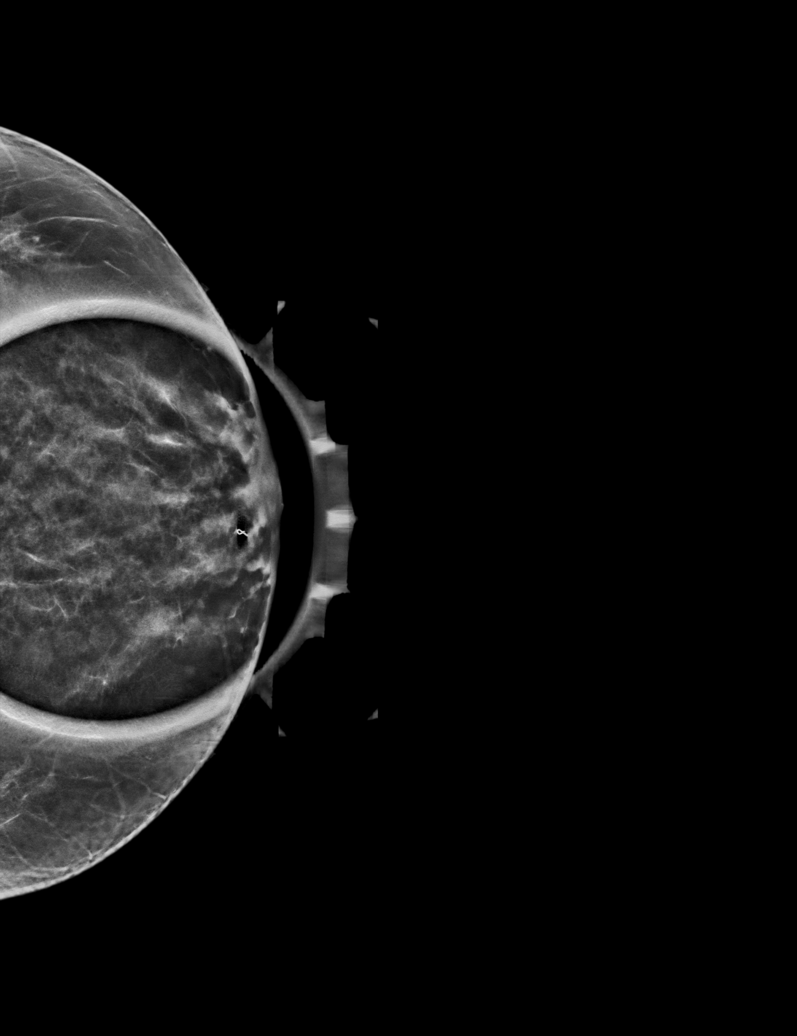

[L MLO synth-2D]
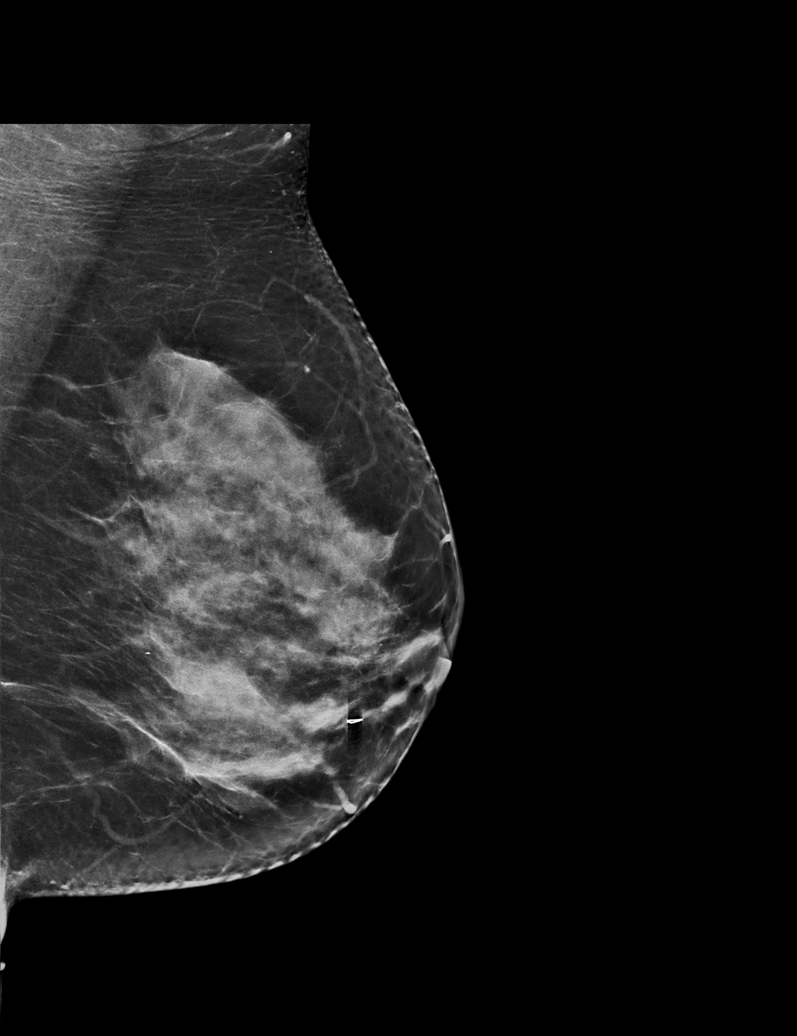

[L CC synth-2D (2 of 2)]
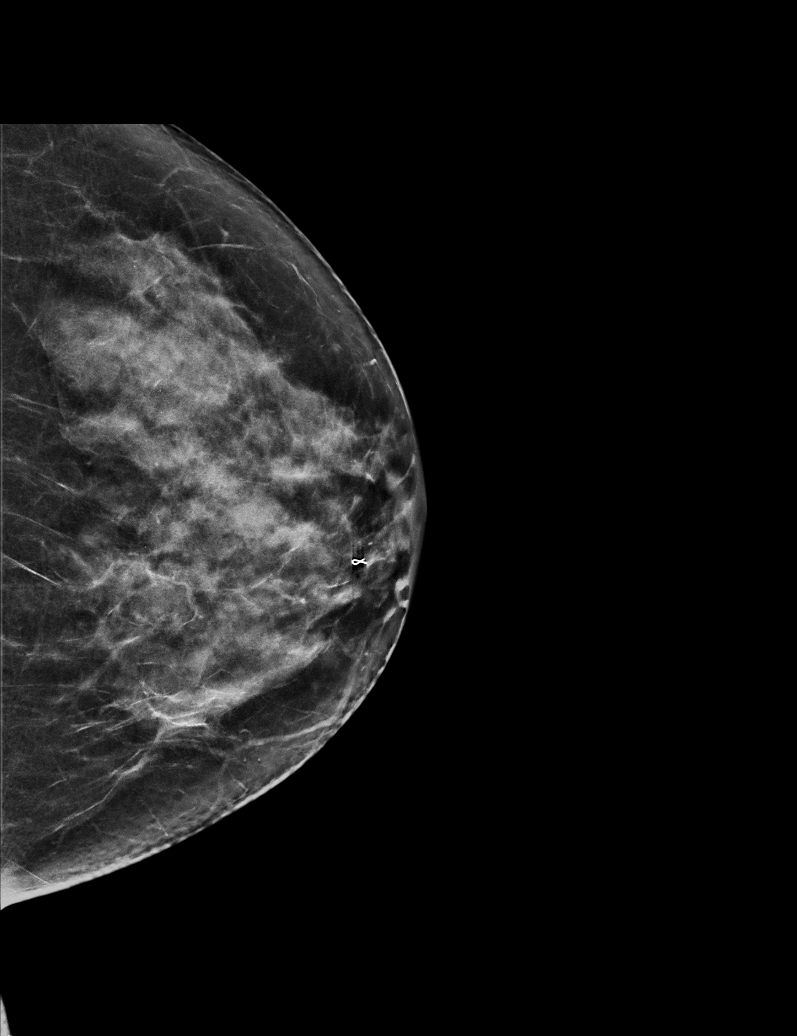

[L MLO tomo · tomo slice 35/70.0]
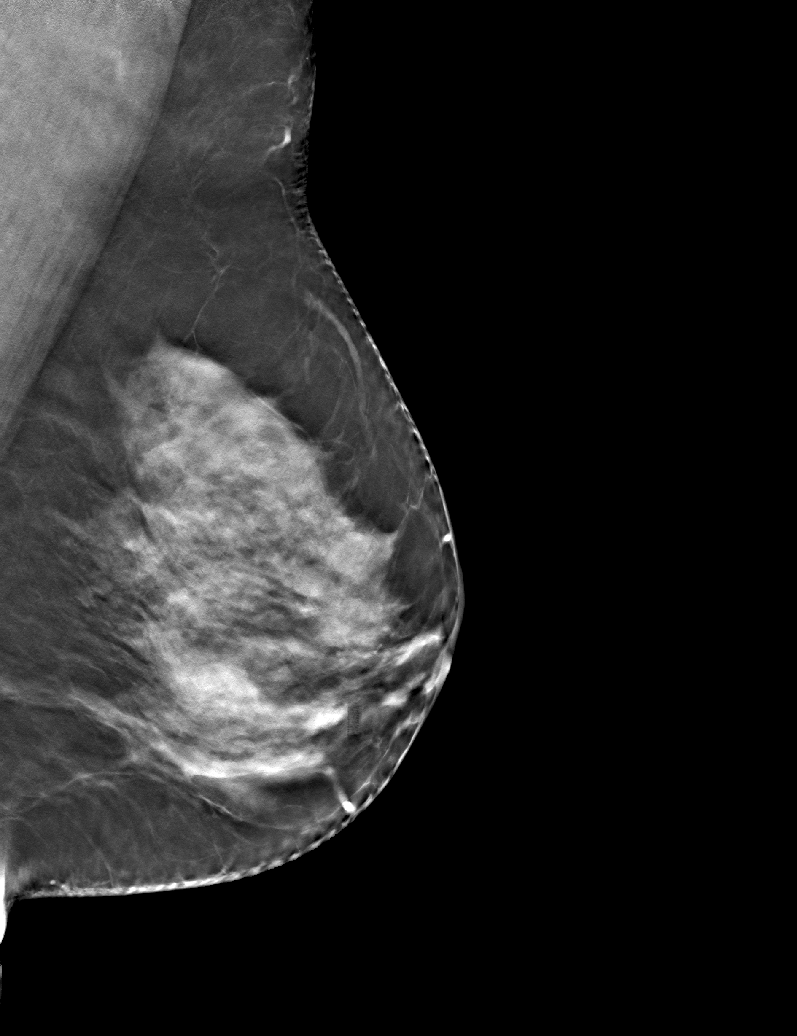

[L CC tomo (1 of 2) · tomo slice 27/53.0]
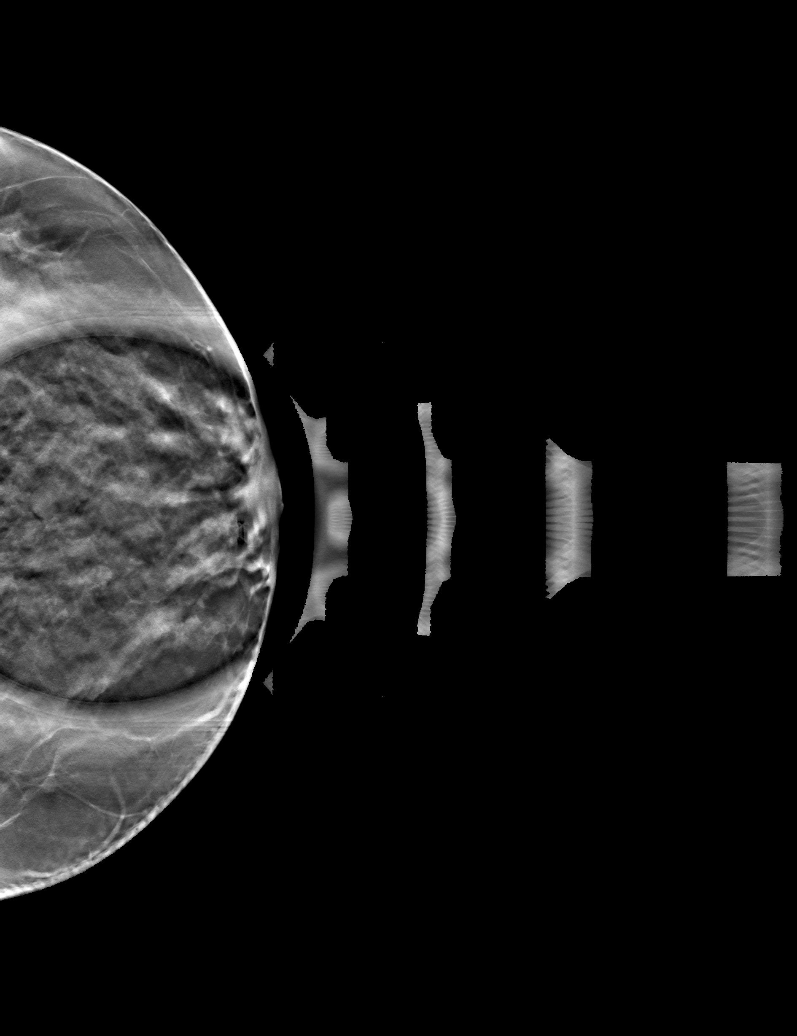

[L CC tomo (2 of 2) · tomo slice 33/65.0]
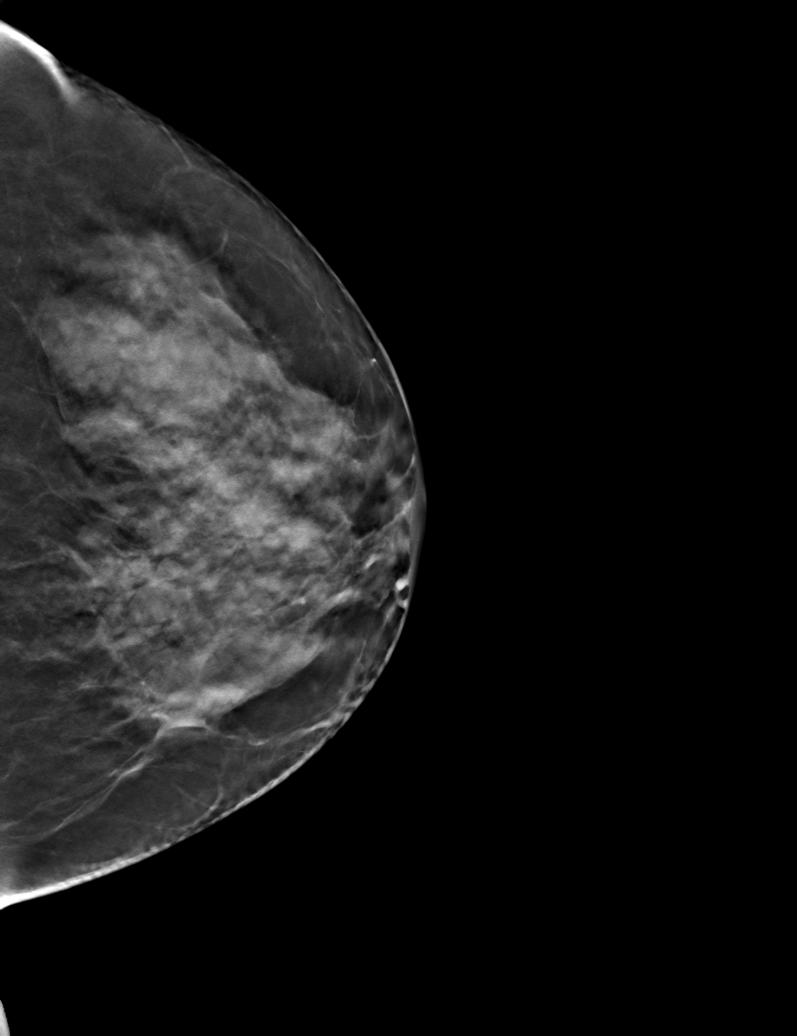

[6 of 18 positions shown; findings below may reference images not displayed]

ACR Breast Density Category c: The breast tissue is heterogeneously
dense, which may obscure small masses.
FINDINGS: There are several round mostly circumscribed masses consistent with
cysts, which have been documented sonography previously.

There are no suspicious masses, no areas of architectural distortion
and no suspicious calcifications. Stable ribbon shaped biopsy clip
lies in the 7 o'clock retroareolar location.

On physical exam, left nipple is partly inverted. Visible discharge
is seen on exam.

Targeted ultrasound is performed, showing a dilated duct containing
an intraductal mass in the left breast retroareolar, slightly
towards 4-5 o'clock, with the mass measuring 5 x 2 x 4 mm. Mass
shows internal blood flow on color Doppler analysis.

Sonographic evaluation of the left axilla shows no enlarged or
abnormal lymph nodes.
IMPRESSION: 1. 5 mm intraductal mass on the left in this patient with
spontaneous bloody left nipple discharge. Tissue sampling is
recommended.

RECOMMENDATION:
1. Ultrasound-guided core needle biopsy small left breast
retroareolar intraductal mass.

I have discussed the findings and recommendations with the patient.
If applicable, a reminder letter will be sent to the patient
regarding the next appointment.

BI-RADS CATEGORY  4: Suspicious.

## 2022-09-06 ENCOUNTER — Telehealth: Payer: Self-pay

## 2022-09-06 NOTE — Telephone Encounter (Signed)
Attempted to return call to [facility] at the following number left on Voicemail. [phone] with interpreter services Mardene Celeste # [phone].  No answer and was unable to leave a message.  Will request that [facility] Guide for [facility] please attempt to call for follow up regarding needs. Care Connect expired 06/04/21 last seen at [facility] 07/14/21. Request emailed securely to [facility].   Francee Nodal RN Clara Gunn/[facility].

## 2022-09-09 ENCOUNTER — Telehealth: Payer: Self-pay

## 2022-09-09 NOTE — Telephone Encounter (Signed)
Attempted to contact [facility] client with interpreter services ID # [phone] to set up an appointment to re-establish primary care with [facility] as it has been over a year since she was seen there.  She renewed/re-enrolled with [facility] on 09/08/22  No answer and was unable to leave a voicemail.  Will attempt again Monday  Francee Nodal RN Clara Gunn/[facility]

## 2022-09-12 ENCOUNTER — Telehealth: Payer: Self-pay

## 2022-09-12 NOTE — Telephone Encounter (Signed)
Attempted to contact newly enrolled [facility] client to assist with making an appointment  to re-establish care with [facility]. Used [employer] Tobi Bastos #[phone] states invalid number Alternate number provided on [facility] application of [phone] also attempted. NO answer, left message requesting return call to Libby Maw of [facility].     Francee Nodal RN [employer]

## 2022-09-12 NOTE — Telephone Encounter (Addendum)
Care connect client returned phone call and with Assistance of Libby Maw we were able to set up an appointment at [facility] to re-establish care. She was last seen April 2023. She states she was due a mammogram in January and is now complaining of "breast leakage" she is unsure of her last mammogram date.  Appointment secured for 09/13/22 at 0900  and she is aware of address. Discussed with client that she needs to keep her [facility] Connect enrollment renewed yearly, she states understanding.                                       Noted on enrollment PHQ9 score was 18, no SI or HI,   Little interest or pleasure doing things: = 3 Feeling down depressed or hopeless = 2 Trouble falling asleep or staying asleep or sleeping too much =3 Feeling tired or having little energy = 3 Poor appetite or overeating = 2 Feeling bad about self or that you are a failure or let yourself down or family down =1 Trouble concentrating or things such as reading or watching TV= 3 Moving or speaking slowly that other people could notice or being too figety or restless = 2 Thoughts you would be better off dead, or of hurting yourself= 0  [facility] available via telehealth if desired, to discuss with provider. She states she only received one Covid vaccine /discussed screening process [facility] and that she may be asked to wear a mask. She states understanding.  Francee Nodal RN [facility] [employer]

## 2022-09-13 ENCOUNTER — Ambulatory Visit: Payer: Self-pay

## 2022-09-13 ENCOUNTER — Encounter: Payer: Self-pay | Admitting: Physician Assistant

## 2022-09-13 ENCOUNTER — Other Ambulatory Visit (HOSPITAL_COMMUNITY): Payer: Self-pay | Admitting: Obstetrics and Gynecology

## 2022-09-13 ENCOUNTER — Telehealth: Payer: Self-pay

## 2022-09-13 ENCOUNTER — Ambulatory Visit: Payer: Self-pay | Admitting: Physician Assistant

## 2022-09-13 ENCOUNTER — Other Ambulatory Visit (HOSPITAL_COMMUNITY)
Admission: RE | Admit: 2022-09-13 | Discharge: 2022-09-13 | Disposition: A | Discharge status: Home / Self Care | Payer: Self-pay | Source: Ambulatory Visit | Attending: Physician Assistant | Admitting: Physician Assistant

## 2022-09-13 VITALS — BP 140/92 | HR 82 | Temp 97.7°F | Ht 60.0 in | Wt 145.5 lb

## 2022-09-13 DIAGNOSIS — Z862 Personal history of diseases of the blood and blood-forming organs and certain disorders involving the immune mechanism: Secondary | ICD-10-CM

## 2022-09-13 DIAGNOSIS — N6452 Nipple discharge: Secondary | ICD-10-CM

## 2022-09-13 DIAGNOSIS — Z13220 Encounter for screening for lipoid disorders: Secondary | ICD-10-CM

## 2022-09-13 DIAGNOSIS — Z9889 Other specified postprocedural states: Secondary | ICD-10-CM

## 2022-09-13 DIAGNOSIS — I10 Essential (primary) hypertension: Secondary | ICD-10-CM

## 2022-09-13 DIAGNOSIS — F419 Anxiety disorder, unspecified: Secondary | ICD-10-CM

## 2022-09-13 DIAGNOSIS — Z789 Other specified health status: Secondary | ICD-10-CM

## 2022-09-13 LAB — LIPID PANEL
Cholesterol: 206 mg/dL — ABNORMAL HIGH (ref 0–200)
HDL: 42 mg/dL (ref >40)
LDL Cholesterol: 117 mg/dL — ABNORMAL HIGH (ref 0–99)
Total CHOL/HDL Ratio: 4.9 RATIO
Triglycerides: 234 mg/dL — ABNORMAL HIGH (ref <150)
VLDL: 47 mg/dL — ABNORMAL HIGH (ref 0–40)

## 2022-09-13 LAB — COMPREHENSIVE METABOLIC PANEL
ALT: 17 U/L (ref 0–44)
AST: 17 U/L (ref 15–41)
Albumin: 4.4 g/dL (ref 3.5–5.0)
Alkaline Phosphatase: 84 U/L (ref 38–126)
Anion gap: 8 (ref 5–15)
BUN: 15 mg/dL (ref 6–20)
CO2: 24 mmol/L (ref 22–32)
Calcium: 8.7 mg/dL — ABNORMAL LOW (ref 8.9–10.3)
Chloride: 105 mmol/L (ref 98–111)
Creatinine, Ser: 0.71 mg/dL (ref 0.44–1.00)
GFR, Estimated: >60 mL/min (ref >60)
Glucose, Bld: 94 mg/dL (ref 70–99)
Potassium: 3.9 mmol/L (ref 3.5–5.1)
Sodium: 137 mmol/L (ref 135–145)
Total Bilirubin: 0.6 mg/dL (ref 0.3–1.2)
Total Protein: 7.9 g/dL (ref 6.5–8.1)

## 2022-09-13 LAB — CBC
HCT: 42.2 % (ref 36.0–46.0)
Hemoglobin: 13.6 g/dL (ref 12.0–15.0)
MCH: 27.2 pg (ref 26.0–34.0)
MCHC: 32.2 g/dL (ref 30.0–36.0)
MCV: 84.4 fL (ref 80.0–100.0)
Platelets: 260 10*3/uL (ref 150–400)
RBC: 5 MIL/uL (ref 3.87–5.11)
RDW: 14.7 % (ref 11.5–15.5)
WBC: 6.5 10*3/uL (ref 4.0–10.5)
nRBC: 0 % (ref 0.0–0.2)

## 2022-09-13 MED ORDER — LOSARTAN POTASSIUM 50 MG PO TABS: 50.0000 mg | ORAL_TABLET | Freq: Every day | ORAL | 1 refills | Status: DC

## 2022-09-13 NOTE — Progress Notes (Signed)
BP (!) 140/92   Pulse 82   Temp 97.7 F (36.5 C)   Ht 5' (1.524 m)   Wt 145 lb 8 oz (66 kg)   SpO2 99%   BMI 28.42 kg/m    Subjective:    Patient ID: Joyce Roberts, female    DOB: October 08, 1970, 52 y.o.   MRN: [id number]  HPI: Joyce Roberts is a 52 y.o. female presenting on 09/13/2022 for Establish Care (Pt is here to re-establish care, pt was last seen her 07-14-21.  Pt had been seen at multiple places and was able to get refills for her losartan. Pt has been out of her losartan for about a month and a half.)   HPI  Chief Complaint  Patient presents with   Establish Care    Pt is here to re-establish care, pt was last seen her 07-14-21.  Pt had been seen at multiple places and was able to get refills for her losartan. Pt has been out of her losartan for about a month and a half.    pt c/o left breast leaking clear discharge x 1 1/2 mo.  No lumps.  It hurts- comes and goes.  No bloody discharge.  She had bx left breast 2022 which was benign  LMP 2 mo ago.  Irregular  Pt  Is taking IBU or APAP for HA.  She takes apap first and only takes the ibu if the apap not working.  Pt states HA 4-7 times each week  Pt is having some stress in her life.  She says she was going to [place] due to a family member was sick.    She says that didn't cause her stress.   One of her grandkids who is 2yo she is worried she is going to have a seizure.  She has fevers  for 5 months.   The parents have taken the child to the doctor. He only has seizure if fever gets high and he only has fever when he gets sick.      She says that is more than anything what is causing her stress.  Pt denies HI, SI.   Also stress because she can't work due to arm weakness from a long ago injury  Pt is difficult historian     Relevant past medical, surgical, family and social history reviewed and updated as indicated. Interim medical history since our last visit reviewed. Allergies and medications reviewed and  updated.   Current Outpatient Medications:    cetirizine (ZYRTEC) 10 MG tablet, Take 1 tablet (10 mg total) by mouth daily as needed for allergies. Tome una tableta por boca diaria cuando sea necesario para las alergias, Disp: 90 tablet, Rfl: 0   ibuprofen (ADVIL) 200 MG tablet, Take 200 mg by mouth daily as needed., Disp: , Rfl:    omeprazole (PRILOSEC) 20 MG capsule, 1 PO BID. 1 POR BOCA DOS VECES AL DIA (Patient taking differently: Take 20 mg by mouth daily. 1 PO BID. 1 POR BOCA DOS VECES AL DIA), Disp: 180 capsule, Rfl: 0   losartan (COZAAR) 50 MG tablet, Take 1 tablet (50 mg total) by mouth daily. Tome una tableta por boca diaria (Patient not taking: Reported on 09/13/2022), Disp: 90 tablet, Rfl: 0    Review of Systems  Per HPI unless specifically indicated above     Objective:    BP (!) 140/92   Pulse 82   Temp 97.7 F (36.5 C)   Ht 5' (1.524  m)   Wt 145 lb 8 oz (66 kg)   SpO2 99%   BMI 28.42 kg/m   Wt Readings from Last 3 Encounters:  09/13/22 145 lb 8 oz (66 kg)  03/31/21 150 lb (68 kg)  02/03/21 153 lb 6.4 oz (69.6 kg)    Physical Exam Vitals reviewed. Exam conducted with a chaperone present.  Constitutional:      General: She is not in acute distress.    Appearance: She is well-developed. She is not toxic-appearing.  HENT:     Head: Normocephalic and atraumatic.     Right Ear: Tympanic membrane, ear canal and external ear normal.     Left Ear: Tympanic membrane, ear canal and external ear normal.  Eyes:     Extraocular Movements: Extraocular movements intact.     Conjunctiva/sclera: Conjunctivae normal.     Pupils: Pupils are equal, round, and reactive to light.  Neck:     Thyroid: No thyromegaly.  Cardiovascular:     Rate and Rhythm: Normal rate and regular rhythm.  Pulmonary:     Effort: Pulmonary effort is normal.     Breath sounds: Normal breath sounds.  Chest:  Breasts:    Right: Inverted nipple present. No swelling, bleeding, nipple discharge, skin  change or tenderness.     Left: Inverted nipple present. No swelling, bleeding, mass, nipple discharge, skin change or tenderness.  Abdominal:     General: Bowel sounds are normal.     Palpations: Abdomen is soft. There is no mass.     Tenderness: There is no abdominal tenderness.  Musculoskeletal:     Cervical back: Neck supple.     Right lower leg: No edema.     Left lower leg: No edema.  Lymphadenopathy:     Cervical: No cervical adenopathy.     Upper Body:     Right upper body: No supraclavicular or axillary adenopathy.     Left upper body: No supraclavicular or axillary adenopathy.  Skin:    General: Skin is warm and dry.  Neurological:     Mental Status: She is alert and oriented to person, place, and time.     Motor: No weakness or tremor.     Gait: Gait normal.     Deep Tendon Reflexes:     Reflex Scores:      Patellar reflexes are 2+ on the right side and 2+ on the left side. Psychiatric:        Mood and Affect: Affect is flat.        Behavior: Behavior normal. Behavior is cooperative.     PHQ-9 score 16 GAD-7 score 12      Assessment & Plan:    Encounter Diagnoses  Name Primary?   Primary hypertension Yes   Discharge from breast    History of left breast biopsy    History of anemia    Screening cholesterol level    Anxiety and depression    Not proficient in English language      -Renew losartan -Update labs -Refer [facility] for counseling -refer for Diagnostic mammogram -Colon cancer screening UTD (colonoscopy 2022) -PAP UTD (2022) -pt to follow up 2 weeks (will schedule so she can get seen at same time by [facility]).  She is to contact office sooner prn

## 2022-09-13 NOTE — Patient Instructions (Addendum)
Para su cita con [facility], registrese en el [facility] p[facility] [place] y lleve una identificacion.  Si necesita cambiar su cita con [facility], llame al [phone]

## 2022-09-13 NOTE — Telephone Encounter (Signed)
Attempted call with interpreter services for follow up after appointment [facility] today. No answer, did not leave a message today. Will attempt to contact again 09/14/22   Francee Nodal RN Clara Gunn/[facility]

## 2022-09-27 ENCOUNTER — Encounter: Payer: Self-pay | Admitting: Physician Assistant

## 2022-09-27 ENCOUNTER — Ambulatory Visit: Payer: Self-pay | Admitting: Physician Assistant

## 2022-09-27 ENCOUNTER — Ambulatory Visit: Payer: Self-pay

## 2022-09-27 VITALS — BP 147/90 | HR 84 | Temp 97.8°F | Ht 60.0 in | Wt 146.5 lb

## 2022-09-27 DIAGNOSIS — E785 Hyperlipidemia, unspecified: Secondary | ICD-10-CM

## 2022-09-27 DIAGNOSIS — I10 Essential (primary) hypertension: Secondary | ICD-10-CM

## 2022-09-27 DIAGNOSIS — F419 Anxiety disorder, unspecified: Secondary | ICD-10-CM

## 2022-09-27 DIAGNOSIS — Z789 Other specified health status: Secondary | ICD-10-CM

## 2022-09-27 DIAGNOSIS — K760 Fatty (change of) liver, not elsewhere classified: Secondary | ICD-10-CM

## 2022-09-27 MED ORDER — CETIRIZINE HCL 10 MG PO TABS: 10.0000 mg | ORAL_TABLET | Freq: Every day | ORAL | 0 refills | Status: DC | PRN

## 2022-09-27 MED ORDER — LOSARTAN POTASSIUM 100 MG PO TABS: 100.0000 mg | ORAL_TABLET | Freq: Every day | ORAL | 0 refills | Status: DC

## 2022-09-27 MED ORDER — ATORVASTATIN CALCIUM 20 MG PO TABS: 20.0000 mg | ORAL_TABLET | Freq: Every day | ORAL | 0 refills | Status: DC

## 2022-09-27 MED ORDER — OMEPRAZOLE 20 MG PO CPDR: DELAYED_RELEASE_CAPSULE | ORAL | 0 refills | Status: DC

## 2022-09-27 NOTE — Progress Notes (Signed)
This is session # 1 with Joyce Roberts for individual [name] services for anxiety and depression. Patient presents alone with Spanish interpreter. BHP spent 40 minutes with patient.    Patient reports anxious symptoms including intrusive worries and autonomic hyperactivity and depressive symptoms including tiredness, lack of motivation, hopelessness, irritability, decreased interest, and sadness. Symptoms are related to chronic pain, poor sleep and eating patterns, recent health concerns, and grief hx. Patient will likely benefit from individual support services to aid coping, processing, and strengths-based perspective.   This procedure has been fully reviewed with the patient and informed consent has been obtained.   Patient location: Pt seen in clinic via telehealth.   I connected with Joyce Roberts on 09/27/22 by a video enabled telemedicine application and verified that I am speaking with the correct person.   I discussed the limitations of evaluation and management by telemedicine. The patient expressed understanding and agreed to proceed.     ASSESSMENT AND PLAN   Current diagnosis: anxiety and depression   We created the following treatment plan at today's appointment:               1)  09/27/22               2)  Decrease anxious and depressive symptoms through increased pain management, support, processing, and coping and self-care skills               3)  Medication management with Provider               4)  Next screening due in 6 sessions.     HISTORY OF PRESENT ILLNESS   Mental Status: Aldean presented oriented X4. Mood and affect were depressed. Judgment was appropriate. Memory was appropriate. Thought processes and content were  appropriate. Speech was appropriate. Patient denies SI/HI.   PHQ9: Score of 16 recorded 09/13/2022.  GAD7: Score of 12 recorded 09/13/2022.    Narrative: Met with pt for scheduled appointment. Informed consent obtained verbally. Person-centered approach used to begin  therapeutic alliance. Pt explores concerns, hx, symptoms, motivators, and goals. Support and normalization offered. Identified resources such as her grandchildren and faith. Goals considered include self-care and inspiration, processing grief, and reframing negative cognitions. Assessed for any other concerns pt wanted to address today, which they denied. Scheduled follow up.   Effectiveness of the intervention(s) and the beneficiary's response or progress toward goal(s):  Patient is in a contemplative stage of change to engage in treatment plan.

## 2022-09-27 NOTE — Progress Notes (Signed)
BP (!) 147/90   Pulse 84   Temp 97.8 F (36.6 C)   Ht 5' (1.524 m)   Wt 146 lb 8 oz (66.5 kg)   SpO2 99%   BMI 28.61 kg/m    Subjective:    Patient ID: Joyce Roberts, female    DOB: 21-Apr-1970, 52 y.o.   MRN: [id number]  HPI: Joyce Roberts is a 52 y.o. female presenting on 09/27/2022 for Follow-up (Pt has questions about her fatty liver - pt is concerned since her father had history of cirrhosis of the liver)   HPI   Chief Complaint  Patient presents with   Follow-up    Pt has questions about her fatty liver - pt is concerned since her father had history of cirrhosis of the liver    Pt had appointment 09/13/22 to re-establish care.   She says she checks her bp at home sometimes and at times it is okay (lowest 135 systolic) and at other times it is very high.  She is not having any CP or dyspnea.   Pt was noted to have FLD on CT 07/28/20   Relevant past medical, surgical, family and social history reviewed and updated as indicated. Interim medical history since our last visit reviewed. Allergies and medications reviewed and updated.    Current Outpatient Medications:    cetirizine (ZYRTEC) 10 MG tablet, Take 1 tablet (10 mg total) by mouth daily as needed for allergies. Tome una tableta por boca diaria cuando sea necesario para las alergias, Disp: 90 tablet, Rfl: 0   ibuprofen (ADVIL) 200 MG tablet, Take 200 mg by mouth daily as needed., Disp: , Rfl:    losartan (COZAAR) 50 MG tablet, Take 1 tablet (50 mg total) by mouth daily. Tome una tableta por boca diaria, Disp: 30 tablet, Rfl: 1   omeprazole (PRILOSEC) 20 MG capsule, 1 PO BID. 1 POR BOCA DOS VECES AL DIA (Patient taking differently: Take 20 mg by mouth in the morning and at bedtime. 1 PO BID. 1 POR BOCA DOS VECES AL DIA), Disp: 180 capsule, Rfl: 0    Review of Systems  Per HPI unless specifically indicated above     Objective:    BP (!) 147/90   Pulse 84   Temp 97.8 F (36.6 C)   Ht 5' (1.524 m)   Wt  146 lb 8 oz (66.5 kg)   SpO2 99%   BMI 28.61 kg/m   Wt Readings from Last 3 Encounters:  09/27/22 146 lb 8 oz (66.5 kg)  09/13/22 145 lb 8 oz (66 kg)  03/31/21 150 lb (68 kg)    Physical Exam Vitals reviewed.  Constitutional:      General: She is not in acute distress.    Appearance: She is well-developed. She is not toxic-appearing.  HENT:     Head: Normocephalic and atraumatic.  Cardiovascular:     Rate and Rhythm: Normal rate and regular rhythm.  Pulmonary:     Effort: Pulmonary effort is normal.     Breath sounds: Normal breath sounds.  Abdominal:     General: Bowel sounds are normal.     Palpations: Abdomen is soft. There is no mass.     Tenderness: There is no abdominal tenderness.  Musculoskeletal:     Cervical back: Neck supple.     Right lower leg: No edema.     Left lower leg: No edema.  Lymphadenopathy:     Cervical: No cervical adenopathy.  Skin:  General: Skin is warm and dry.  Neurological:     Mental Status: She is alert and oriented to person, place, and time.  Psychiatric:        Behavior: Behavior normal.         Results for orders placed or performed during the hospital encounter of 09/13/22  Lipid panel  Result Value Ref Range   Cholesterol 206 (H) 0 - 200 mg/dL   Triglycerides 027 (H) <150 mg/dL   HDL 42 >25 mg/dL   Total CHOL/HDL Ratio 4.9 RATIO   VLDL 47 (H) 0 - 40 mg/dL   LDL Cholesterol 366 (H) 0 - 99 mg/dL  Comprehensive metabolic panel  Result Value Ref Range   Sodium 137 135 - 145 mmol/L   Potassium 3.9 3.5 - 5.1 mmol/L   Chloride 105 98 - 111 mmol/L   CO2 24 22 - 32 mmol/L   Glucose, Bld 94 70 - 99 mg/dL   BUN 15 6 - 20 mg/dL   Creatinine, Ser 4.40 0.44 - 1.00 mg/dL   Calcium 8.7 (L) 8.9 - 10.3 mg/dL   Total Protein 7.9 6.5 - 8.1 g/dL   Albumin 4.4 3.5 - 5.0 g/dL   AST 17 15 - 41 U/L   ALT 17 0 - 44 U/L   Alkaline Phosphatase 84 38 - 126 U/L   Total Bilirubin 0.6 0.3 - 1.2 mg/dL   GFR, Estimated >34 >74 mL/min    Anion gap 8 5 - 15  CBC  Result Value Ref Range   WBC 6.5 4.0 - 10.5 K/uL   RBC 5.00 3.87 - 5.11 MIL/uL   Hemoglobin 13.6 12.0 - 15.0 g/dL   HCT 25.9 56.3 - 87.5 %   MCV 84.4 80.0 - 100.0 fL   MCH 27.2 26.0 - 34.0 pg   MCHC 32.2 30.0 - 36.0 g/dL   RDW 64.3 32.9 - 51.8 %   Platelets 260 150 - 400 K/uL   nRBC 0.0 0.0 - 0.2 %      Assessment & Plan:   Encounter Diagnoses  Name Primary?   Primary hypertension Yes   Hyperlipidemia, unspecified hyperlipidemia type    Anxiety and depression    Hepatic steatosis    Not proficient in English language       Htn -increase losartan -reviewed labs with pt   Dyslipidemia -rx atorvastatin -pt was counseled on lowfat diet  FLD -pt was counseled on condition and given reading information.  She was encouraged to follow lowfat diet, exercise regularly, maintain healthy weight and avoid etoh  Breast issues/nipple discharge -She has appointment for testing  Anxiety/depression -she has appointment with [facility] today  Pt to follow up 4-6 week to recheck bp.  She is to contact office sooner prn

## 2022-09-27 NOTE — Patient Instructions (Signed)
Enfermedad del hgado graso Fatty Liver Disease  El hgado transforma los alimentos en energa, elimina las sustancias txicas de la [place], [place] protenas importantes y absorbe las vitaminas necesarias de los alimentos. La enfermedad del hgado graso ocurre cuando se acumula demasiada grasa en las clulas del hgado. La enfermedad del hgado graso tambin se llama esteatosis heptica. En muchos casos, la enfermedad del hgado graso no provoca sntomas ni problemas. Con frecuencia, se diagnostica cuando se realizan estudios por otros motivos. Sin embargo, con el [place], el hgado graso puede provocar una inflamacin que posiblemente cause problemas hepticos ms graves, como la fibrosis heptica (cirrosis) o insuficiencia heptica. El hgado graso se asocia con la resistencia a la insulina, el aumento de la grasa corporal, la presin arterial alta (hipertensin) y el colesterol elevado. Estas son caractersticas del sndrome metablico y [place] el riesgo de accidente cerebrovascular, diabetes y enfermedad cardaca. Cules son las causas? Esta afeccin puede estar causada por componentes del sndrome metablico: Obesidad. Resistencia a la insulina. Colesterol alto. Otras causas: Consumo excesivo de alcohol. Nutricin deficiente. Sndrome de [place]. Embarazo. Determinados medicamentos. Txicos. Algunas infecciones virales. Qu incrementa el riesgo? Es ms probable que tengan esta afeccin las personas que: Consumen alcohol en exceso. Tienen sobrepeso. Tienen diabetes. Tienen hepatitis. Tienen un nivel alto de triglicridos. Estn embarazadas. Cules son los signos o sntomas? Con frecuencia, la enfermedad del hgado graso no provoca sntomas. Si se desarrollan sntomas, estos pueden incluir: Fatiga y debilidad. Prdida de peso. Confusin. Nuseas, vmitos o dolor abdominal. Color amarillo en la piel y en la zona blanca de los ojos (ictericia). Picazn en la piel. Cmo se  diagnostica? Este trastorno puede diagnosticarse mediante: Un examen fsico y los antecedentes mdicos. [place]lisis de [place]. Estudios de diagnstico por imgenes, como ecografa, exploracin por tomografa computarizada (TC) o Health visitor (RM). Biopsia de hgado. Se extrae una pequea muestra de tejido del hgado usando [place]. La muestra se examina en el microscopio. Cmo se trata? Con frecuencia, la enfermedad del hgado graso es causada por otras afecciones. El tratamiento para el hgado graso puede incluir medicamentos y cambios en el estilo de vida para controlar enfermedades como: Alcoholismo. Colesterol alto. Diabetes. Sobrepeso u obesidad. Siga estas instrucciones en su casa:  No beba alcohol. Si tiene problemas para dejar de beber, consulte al mdico cmo puede dejar de beber de forma segura con la ayuda de medicamentos o un programa con supervisin. Esto es importante para evitar que la afeccin empeore. Siga una dieta saludable como se lo haya indicado el mdico. Consulte al mdico sobre trabajar con un nutricionista para Animal nutritionist de alimentacin. Haga ejercicio regularmente. Esto puede ayudarlo a [facility], y a Academic librarian y la diabetes. Hable con el mdico sobre qu actividades son mejores para usted y Audiological scientist de [place]. Use los medicamentos de venta libre y los recetados solamente como se lo haya indicado el mdico. Cumpla con todas las visitas de seguimiento. Esto es importante. Comunquese con un mdico si: Tiene dificultad para controlar lo siguiente: Nivel de Dispensing optician. Esto es muy importante si tiene diabetes. Colesterol. El consumo de alcohol. Solicite ayuda de inmediato si: Siente dolor abdominal. Tiene ictericia. Tiene nuseas y vomita. Vomita sangre o una sustancia similar al poso del caf. Las heces son negras, alquitranadas o sanguinolentas. Resumen La enfermedad del hgado graso se desarrolla cuando se  acumula demasiada grasa en las clulas del hgado. Con frecuencia, la enfermedad del hgado no causa sntomas ni problemas.  Sin embargo, con Museum/gallery conservator, el hgado graso puede provocar una inflamacin que puede causar problemas hepticos ms graves, como fibrosis heptica (cirrosis). Es ms probable que desarrolle esta afeccin si consume alcohol en exceso, est embarazada, tiene sobrepeso, diabetes, hepatitis o altos niveles de triglicridos o de colesterol. Comunquese con el mdico si tiene problemas para controlar su azcar en la sangre, el colesterol o el consumo de alcohol. Esta informacin no tiene Theme park manager el consejo del mdico. Asegrese de hacerle al mdico cualquier pregunta que tenga. Document Revised: 01/21/2020 Document Reviewed: 01/21/2020 Elsevier Patient Education  2024 [employer].

## 2022-10-11 ENCOUNTER — Ambulatory Visit: Payer: Self-pay

## 2022-10-11 ENCOUNTER — Other Ambulatory Visit: Payer: Self-pay | Admitting: Physician Assistant

## 2022-10-11 MED ORDER — LOSARTAN POTASSIUM 100 MG PO TABS: 100.0000 mg | ORAL_TABLET | Freq: Every day | ORAL | 1 refills | Status: DC

## 2022-10-17 ENCOUNTER — Ambulatory Visit (HOSPITAL_COMMUNITY)
Admission: RE | Admit: 2022-10-17 | Discharge: 2022-10-17 | Disposition: A | Discharge status: Home / Self Care | Payer: Self-pay | Source: Ambulatory Visit | Attending: Physician Assistant | Admitting: Physician Assistant

## 2022-10-17 ENCOUNTER — Encounter: Payer: Self-pay | Admitting: Physician Assistant

## 2022-10-17 ENCOUNTER — Ambulatory Visit: Payer: Self-pay | Admitting: Physician Assistant

## 2022-10-17 DIAGNOSIS — J069 Acute upper respiratory infection, unspecified: Secondary | ICD-10-CM

## 2022-10-17 DIAGNOSIS — R509 Fever, unspecified: Secondary | ICD-10-CM

## 2022-10-17 DIAGNOSIS — R051 Acute cough: Secondary | ICD-10-CM | POA: Insufficient documentation

## 2022-10-17 DIAGNOSIS — R519 Headache, unspecified: Secondary | ICD-10-CM

## 2022-10-17 DIAGNOSIS — J029 Acute pharyngitis, unspecified: Secondary | ICD-10-CM

## 2022-10-17 DIAGNOSIS — Z789 Other specified health status: Secondary | ICD-10-CM

## 2022-10-17 MED ORDER — LOSARTAN POTASSIUM 100 MG PO TABS
100.0000 mg | ORAL_TABLET | Freq: Every day | ORAL | 1 refills | Status: DC
Start: 2022-10-17 — End: 2022-10-26

## 2022-10-17 MED ORDER — ATORVASTATIN CALCIUM 20 MG PO TABS: 20.0000 mg | ORAL_TABLET | Freq: Every day | ORAL | 0 refills | Status: DC

## 2022-10-17 NOTE — Progress Notes (Signed)
   There were no vitals taken for this visit.   Subjective:    Patient ID: Joyce Roberts, female    DOB: 12-02-1970, 52 y.o.   MRN: [id number]  HPI: Joyce Roberts is a 52 y.o. female presenting on 10/17/2022 for No chief complaint on file.   HPI   This is a telemedicine appointment via telephone due to pt has poor wifi.  I connected with  Joyce Roberts on 10/17/22 by a video enabled telemedicine application and verified that I am speaking with the correct person using two identifiers.   I discussed the limitations of evaluation and management by telemedicine. The patient expressed understanding and agreed to proceed.  Pt is at home.  Provider and translator are at office.  Pt says she has been feeling bad since it Started sunday before last with HA and ST and  she has phlegm.  She is Still having a lot of congestion and throat pain.Marland Kitchen  also she has had fever.  - last Monday temp 101 and Today 100.7.  Temp were checked on her forehead.  She has urge to cough but can't.  She feels Feels tight in her chest when she wants to cough.    She is coughing up phlegm.   She took some advil and APAP and some pills / lozenges for ST  Her husband and hijo are not sick   She is eating but it hurts her throat.   She is not resting but is at home because she doesn't work.  She feels tired but is still doing her regular activities.  She is still babysitting her grandson who lives with her.   She did a covid test yesterday afternoon which was negative.  She got at least 2 covid vaccination doses.       Relevant past medical, surgical, family and social history reviewed and updated as indicated. Interim medical history since our last visit reviewed. Allergies and medications reviewed and updated.  Review of Systems  Per HPI unless specifically indicated above     Objective:    There were no vitals taken for this visit.  Wt Readings from Last 3 Encounters:  09/27/22 146 lb 8 oz  (66.5 kg)  09/13/22 145 lb 8 oz (66 kg)  03/31/21 150 lb (68 kg)    Physical Exam Pulmonary:     Effort: Pulmonary effort is normal. No respiratory distress.     Comments: Pt is talking in complete sentences without dyspnea Neurological:     Mental Status: She is alert and oriented to person, place, and time.  Psychiatric:        Speech: Speech normal.           Assessment & Plan:    Encounter Diagnoses  Name Primary?   Upper respiratory tract infection, unspecified type Yes   Acute cough    Fever, unspecified fever cause    Acute nonintractable headache, unspecified headache type    Sore throat    Not proficient in English language       -Recommended Warm salt water gargles q 2 hour while awake, rest, fluids -planned to Rx guiafenesin but pt allergic  -check cbc and cxr -likely viral URI

## 2022-10-18 ENCOUNTER — Ambulatory Visit: Payer: Self-pay

## 2022-10-18 ENCOUNTER — Other Ambulatory Visit (HOSPITAL_COMMUNITY)
Admission: RE | Admit: 2022-10-18 | Discharge: 2022-10-18 | Disposition: A | Discharge status: Home / Self Care | Payer: Self-pay | Source: Ambulatory Visit | Attending: Physician Assistant | Admitting: Physician Assistant

## 2022-10-18 DIAGNOSIS — R509 Fever, unspecified: Secondary | ICD-10-CM | POA: Insufficient documentation

## 2022-10-18 DIAGNOSIS — R051 Acute cough: Secondary | ICD-10-CM | POA: Insufficient documentation

## 2022-10-18 LAB — CBC WITH DIFFERENTIAL/PLATELET
Abs Immature Granulocytes: 0.02 10*3/uL (ref 0.00–0.07)
Basophils Absolute: 0 10*3/uL (ref 0.0–0.1)
Basophils Relative: 0 %
Eosinophils Absolute: 0.1 10*3/uL (ref 0.0–0.5)
Eosinophils Relative: 2 %
HCT: 39.6 % (ref 36.0–46.0)
Hemoglobin: 13 g/dL (ref 12.0–15.0)
Immature Granulocytes: 0 %
Lymphocytes Relative: 39 %
Lymphs Abs: 1.9 10*3/uL (ref 0.7–4.0)
MCH: 27.3 pg (ref 26.0–34.0)
MCHC: 32.8 g/dL (ref 30.0–36.0)
MCV: 83.2 fL (ref 80.0–100.0)
Monocytes Absolute: 0.4 10*3/uL (ref 0.1–1.0)
Monocytes Relative: 7 %
Neutro Abs: 2.5 10*3/uL (ref 1.7–7.7)
Neutrophils Relative %: 52 %
Platelets: 238 10*3/uL (ref 150–400)
RBC: 4.76 MIL/uL (ref 3.87–5.11)
RDW: 14.1 % (ref 11.5–15.5)
WBC: 4.9 10*3/uL (ref 4.0–10.5)
nRBC: 0 % (ref 0.0–0.2)

## 2022-10-21 ENCOUNTER — Other Ambulatory Visit: Payer: Self-pay

## 2022-10-21 ENCOUNTER — Inpatient Hospital Stay: Payer: Self-pay | Attending: Hematology | Admitting: Hematology and Oncology

## 2022-10-21 VITALS — BP 150/87 | Wt 145.4 lb

## 2022-10-21 DIAGNOSIS — N6452 Nipple discharge: Secondary | ICD-10-CM

## 2022-10-21 DIAGNOSIS — Z1211 Encounter for screening for malignant neoplasm of colon: Secondary | ICD-10-CM

## 2022-10-21 NOTE — Patient Instructions (Signed)
Taught Joyce Roberts about self breast awareness and gave educational materials to take home. Patient did not need a Pap smear today due to last Pap smear was in 04/28/20 per patient. Let her know [facility] will cover Pap smears every 5 years unless has a history of abnormal Pap smears. [facility]erred [facility] to [facility] for diagnostic mammogram. Appointment scheduled for 10/25/22. Patient aware of appointment and will be there. Let patient know will follow up with her within the next couple weeks with results. Joyce Roberts verbalized understanding.  Pascal Lux, NP 9:46 AM

## 2022-10-21 NOTE — Progress Notes (Signed)
[name] is a 52 y.o. female who presents to [facility] clinic today with complaint of left nipple discharge.    Pap Smear: Pap not smear completed today. Last Pap smear was 04/28/2020 at [facility] clinic and was normal. Per patient has no history of an abnormal Pap smear. Last Pap smear result is available in Epic.   Physical exam: Breasts Breasts symmetrical. No skin abnormalities bilateral breasts. No nipple retraction bilateral breasts. No nipple discharge bilateral breasts. No lymphadenopathy. No lumps palpated bilateral breasts.   MS DIGITAL SCREENING TOMO BILATERAL  Result Date: 04/13/2021 CLINICAL DATA:  Screening. EXAM: DIGITAL SCREENING BILATERAL MAMMOGRAM WITH TOMOSYNTHESIS AND CAD TECHNIQUE: Bilateral screening digital craniocaudal and mediolateral oblique mammograms were obtained. Bilateral screening digital breast tomosynthesis was performed. The images were evaluated with computer-aided detection. COMPARISON:  Previous exam(s). ACR Breast Density Category c: The breast tissue is heterogeneously dense, which may obscure small masses. FINDINGS: There are no findings suspicious for malignancy. IMPRESSION: No mammographic evidence of malignancy. A result letter of this screening mammogram will be mailed directly to the patient. RECOMMENDATION: Screening mammogram in one year. (Code:SM-B-01Y) BI-RADS CATEGORY  1: Negative. Electronically Signed   By: Amie Portland M.D.   On: 04/13/2021 09:38   MM DIAG BREAST TOMO UNI LEFT  Result Date: 10/06/2020 CLINICAL DATA:  52 year old female with recent left breast excision for intraductal papilloma at site of wing shaped biopsy marking clip. At time of patient's surgical excision 07/06/2020 the RF tag fell out of the biopsy cavity/was inadvertently suctioned. Patient presents today to evaluate for any residual clips in the left breast. EXAM: DIGITAL DIAGNOSTIC UNILATERAL LEFT MAMMOGRAM WITH TOMOSYNTHESIS AND CAD TECHNIQUE: Left  digital diagnostic mammography and breast tomosynthesis was performed. The images were evaluated with computer-aided detection. COMPARISON:  Previous exams. ACR Breast Density Category c: The breast tissue is heterogeneously dense, which may obscure small masses. FINDINGS: No suspicious masses or calcifications seen in the left breast. New postsurgical changes are present in the retroareolar left breast related to interval benign excision. The wing shaped biopsy marking clip at site of previously biopsied papilloma as well as the ribbon shaped biopsy marking clip at site of prior benign cyst biopsy are present. IMPRESSION: 1.  Wing shaped biopsy marking clip remains in the left breast. 2.  No mammographic evidence of malignancy. RECOMMENDATION: Recommend annual routine screening mammography due December 2022. I have discussed the findings and recommendations with the patient. If applicable, a reminder letter will be sent to the patient regarding the next appointment. BI-RADS CATEGORY  2: Benign. Electronically Signed   By: Edwin Cap M.D.   On: 10/06/2020 16:14  MM BREAST SURGICAL SPECIMEN  Result Date: 07/06/2020 CLINICAL DATA:  LEFT breast papilloma EXAM: SPECIMEN RADIOGRAPH OF THE LEFT BREAST COMPARISON:  06/30/2020 FINDINGS: Status post excision of the LEFT breast. The radiofrequency seed fell out of the tissue specimen intraoperatively and was not submitted. Three small tissue samples were imaged. The biopsy clip which was preoperatively localized by RF seed is not contained within the surgical specimens. The biopsy clip was not identified within a radiograph of the suction filter. IMPRESSION: Specimen radiograph of the LEFT breast as above. Electronically Signed   By: Ulyses Southward M.D.   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 EXAM: RF  TAG LOCALIZATION OF THE LEFT BREAST WITH MAMMO GUIDANCE COMPARISON:  Previous exams. FINDINGS: Patient presents for needle localization prior to left breast excision. I met with the patient and we discussed the procedure of needle localization including benefits and alternatives. We discussed the high likelihood of a successful procedure. We discussed the risks of the procedure, including infection, bleeding, tissue injury, and further surgery. Informed, written consent was given. The usual time-out protocol was performed immediately prior to the procedure. Using mammographic guidance, sterile technique, 1% lidocaine and a 7 cm RF TAG needle, the wing shaped biopsy marking clip in the lateral left breast at site of biopsied intraductal papilloma was localized using a lateral to medial approach. RF TAG FUNCTION was confirmed. The images were marked for Bridges. IMPRESSION: RF TAG localization of the left breast. No apparent complications. Electronically Signed   By: Edwin Cap M.D.   On: 06/30/2020 08:26   MM CLIP PLACEMENT LEFT  Result Date: 05/29/2020 CLINICAL DATA:  Patient status post ultrasound-guided biopsy intraductal mass left breast. EXAM: DIAGNOSTIC LEFT MAMMOGRAM POST ULTRASOUND BIOPSY COMPARISON:  Previous exam(s). FINDINGS: Mammographic images were obtained following ultrasound guided biopsy of intraductal left breast mass. The biopsy marking clip is in expected position at the site of biopsy. Appropriate position wing shaped marking clip after biopsy of intraductal mass left breast. Previously placed ribbon shaped clip is in stable position. IMPRESSION: Appropriate positioning of the wing shaped biopsy marking clip at the site of biopsy in the intraductal retroareolar breast mass. Final Assessment: Post Procedure Mammograms for Marker Placement Electronically Signed   By: Annia Belt M.D.   On: 05/29/2020 13:42   MS DIGITAL DIAG TOMO UNI LEFT  Result Date:  05/12/2020 CLINICAL DATA:  Patient with several week history of spontaneous bloody left nipple discharge. She has a history of a previous retroareolar left breast benign biopsy which revealed pathology consistent with a ruptured cyst. EXAM: DIGITAL DIAGNOSTIC UNILATERAL LEFT MAMMOGRAM WITH TOMOSYNTHESIS AND CAD; ULTRASOUND LEFT BREAST LIMITED TECHNIQUE: Left digital diagnostic mammography and breast tomosynthesis was performed. The images were evaluated with computer-aided detection.; Targeted ultrasound examination of the left breast was performed COMPARISON:  Previous exam(s). ACR Breast Density Category c: The breast tissue is heterogeneously dense, which may obscure small masses. FINDINGS: There are several round mostly circumscribed masses consistent with cysts, which have been documented sonography previously. There are no suspicious masses, no areas of architectural distortion and no suspicious calcifications. Stable ribbon shaped biopsy clip lies in the 7 o'clock retroareolar location. On physical exam, left nipple is partly inverted. Visible discharge is seen on exam. Targeted ultrasound is performed, showing a dilated duct containing an intraductal mass in the left breast retroareolar, slightly towards 4-5 o'clock, with the mass measuring 5 x 2 x 4 mm. Mass shows internal blood flow on color Doppler analysis. Sonographic evaluation of the left axilla shows no enlarged or abnormal lymph nodes. IMPRESSION: 1. 5 mm intraductal mass on the left in this patient with spontaneous bloody left nipple discharge. Tissue sampling is recommended. RECOMMENDATION: 1. Ultrasound-guided core needle biopsy small left breast retroareolar intraductal mass. I have discussed the findings and recommendations with the patient. If applicable, a reminder letter will be sent to the patient regarding the next appointment. BI-RADS CATEGORY  4: Suspicious. Electronically Signed   By: Amie Portland M.D.   On: 05/12/2020 14:27   MS  DIGITAL SCREENING TOMO BILATERAL  Result Date: 03/24/2020 CLINICAL DATA:  Screening. EXAM: DIGITAL SCREENING BILATERAL MAMMOGRAM WITH TOMO AND  CAD COMPARISON:  Previous exam(s). ACR Breast Density Category c: The breast tissue is heterogeneously dense, which may obscure small masses. FINDINGS: There are no findings suspicious for malignancy. Images were processed with CAD. IMPRESSION: No mammographic evidence of malignancy. A result letter of this screening mammogram will be mailed directly to the patient. RECOMMENDATION: Screening mammogram in one year. (Code:SM-B-01Y) BI-RADS CATEGORY  1: Negative. Electronically Signed   By: Harmon Pier M.D.   On: 03/24/2020 14:56   MS DIGITAL DIAG TOMO UNI LEFT  Result Date: 12/24/2019 CLINICAL DATA:  52 year old female with diffuse LEFT breast pain. EXAM: DIGITAL DIAGNOSTIC UNILATERAL LEFT MAMMOGRAM WITH TOMO AND CAD COMPARISON:  Previous exam(s). ACR Breast Density Category c: The breast tissue is heterogeneously dense, which may obscure small masses. FINDINGS: 2D/3D full field views of the LEFT breast demonstrate no suspicious mass, distortion or worrisome calcifications. A RIBBON biopsy clip within the RETROAREOLAR LEFT breast is again identified. Mammographic images were processed with CAD. IMPRESSION: No mammographic evidence of LEFT breast malignancy. RECOMMENDATION: Bilateral screening mammogram in December 2021 to resume annual mammogram schedule. I have discussed the findings, causes and remedies of breast pain and recommendations with the patient. If applicable, a reminder letter will be sent to the patient regarding the next appointment. BI-RADS CATEGORY  1: Negative. Electronically Signed   By: Harmon Pier M.D.   On: 12/24/2019 16:31   MS DIGITAL SCREENING TOMO BILATERAL  Result Date: 02/27/2019 CLINICAL DATA:  Screening. EXAM: DIGITAL SCREENING BILATERAL MAMMOGRAM WITH TOMO AND CAD COMPARISON:  Previous exam(s). ACR Breast Density Category d: The breast  tissue is extremely dense, which lowers the sensitivity of mammography FINDINGS: There are no findings suspicious for malignancy. Images were processed with CAD. IMPRESSION: No mammographic evidence of malignancy. A result letter of this screening mammogram will be mailed directly to the patient. RECOMMENDATION: Screening mammogram in one year. (Code:SM-B-01Y) BI-RADS CATEGORY  1: Negative. Electronically Signed   By: Ted Mcalpine M.D.   On: 02/27/2019 12:55   MS DIGITAL SCREENING TOMO BILATERAL  Result Date: 12/05/2017 CLINICAL DATA:  Screening. EXAM: DIGITAL SCREENING BILATERAL MAMMOGRAM WITH TOMO AND CAD COMPARISON:  Previous exam(s). ACR Breast Density Category c: The breast tissue is heterogeneously dense, which may obscure small masses. FINDINGS: There are no findings suspicious for malignancy. Images were processed with CAD. IMPRESSION: No mammographic evidence of malignancy. A result letter of this screening mammogram will be mailed directly to the patient. RECOMMENDATION: Screening mammogram in one year. (Code:SM-B-01Y) BI-RADS CATEGORY  1: Negative. Electronically Signed   By: Annia Belt M.D.   On: 12/05/2017 08:48        Pelvic/Bimanual Pap is not indicated today    Smoking History: Patient has never smoked and was not referred to quit line.    Patient Navigation: Patient education provided. Access to services provided for patient through [facility] program. Sonia interpreter provided. No transportation provided   Colorectal Cancer Screening: Per patient has had colonoscopy completed on 11/10/20 with benign results.   No complaints today. FIT test given.    Breast and Cervical Cancer Risk Assessment: Patient does not have family history of breast cancer, known genetic mutations, or radiation treatment to the chest before age 21. Patient does not have history of cervical dysplasia, immunocompromised, or DES exposure in-utero.  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 She states the discharge is clear, sticky and occurs without expression. Benign exam today.   P: [facility] to [facility] for a diagnostic mammogram. Appointment scheduled 10/25/22.  Pascal Lux, NP 10/21/2022 9:45 AM

## 2022-10-25 ENCOUNTER — Ambulatory Visit (HOSPITAL_COMMUNITY)
Admission: RE | Admit: 2022-10-25 | Discharge: 2022-10-25 | Disposition: A | Discharge status: Home / Self Care | Payer: Self-pay | Source: Ambulatory Visit | Attending: Obstetrics and Gynecology | Admitting: Obstetrics and Gynecology

## 2022-10-25 ENCOUNTER — Other Ambulatory Visit: Payer: Self-pay | Admitting: Hematology and Oncology

## 2022-10-25 ENCOUNTER — Encounter (HOSPITAL_COMMUNITY): Payer: Self-pay

## 2022-10-25 ENCOUNTER — Other Ambulatory Visit (HOSPITAL_COMMUNITY): Payer: Self-pay | Admitting: Obstetrics and Gynecology

## 2022-10-25 DIAGNOSIS — N6452 Nipple discharge: Secondary | ICD-10-CM

## 2022-10-25 DIAGNOSIS — R928 Other abnormal and inconclusive findings on diagnostic imaging of breast: Secondary | ICD-10-CM

## 2022-10-25 DIAGNOSIS — N6320 Unspecified lump in the left breast, unspecified quadrant: Secondary | ICD-10-CM

## 2022-10-26 ENCOUNTER — Ambulatory Visit: Payer: Self-pay | Admitting: Physician Assistant

## 2022-10-26 ENCOUNTER — Encounter: Payer: Self-pay | Admitting: Physician Assistant

## 2022-10-26 VITALS — BP 126/85 | HR 76 | Temp 97.8°F | Ht 60.0 in | Wt 142.2 lb

## 2022-10-26 DIAGNOSIS — J019 Acute sinusitis, unspecified: Secondary | ICD-10-CM

## 2022-10-26 DIAGNOSIS — Z789 Other specified health status: Secondary | ICD-10-CM

## 2022-10-26 DIAGNOSIS — I10 Essential (primary) hypertension: Secondary | ICD-10-CM

## 2022-10-26 DIAGNOSIS — F419 Anxiety disorder, unspecified: Secondary | ICD-10-CM

## 2022-10-26 DIAGNOSIS — N6320 Unspecified lump in the left breast, unspecified quadrant: Secondary | ICD-10-CM

## 2022-10-26 MED ORDER — ATORVASTATIN CALCIUM 20 MG PO TABS: 20.0000 mg | ORAL_TABLET | Freq: Every day | ORAL | 0 refills | Status: DC

## 2022-10-26 MED ORDER — LOSARTAN POTASSIUM 100 MG PO TABS: 100.0000 mg | ORAL_TABLET | Freq: Every day | ORAL | 1 refills | Status: DC

## 2022-10-26 MED ORDER — AMOXICILLIN 500 MG PO CAPS: 500.0000 mg | ORAL_CAPSULE | Freq: Three times a day (TID) | ORAL | 0 refills | Status: AC

## 2022-10-26 MED ORDER — PROMETHAZINE HCL 25 MG PO TABS: 25.0000 mg | ORAL_TABLET | Freq: Three times a day (TID) | ORAL | 0 refills | Status: DC | PRN

## 2022-10-26 MED ORDER — OMEPRAZOLE 20 MG PO CPDR: DELAYED_RELEASE_CAPSULE | ORAL | 0 refills | Status: DC

## 2022-10-26 MED ORDER — CITALOPRAM HYDROBROMIDE 20 MG PO TABS: 20.0000 mg | ORAL_TABLET | Freq: Every day | ORAL | 0 refills | Status: DC

## 2022-10-26 NOTE — Progress Notes (Signed)
BP 126/85   Pulse 76   Temp 97.8 F (36.6 C)   Ht 5' (1.524 m)   Wt 142 lb 4 oz (64.5 kg)   SpO2 99%   BMI 27.78 kg/m    Subjective:    Patient ID: Joyce Roberts, female    DOB: 11-20-1970, 52 y.o.   MRN: [id number]  HPI: Joyce Roberts is a 52 y.o. female presenting on 10/26/2022 for Hypertension, Mental Health Problem, and Facial Pain (Pt reports pain from nose up to head since Sunday night. Pt reports vomiting from pain. Pt reports last emesis episode was 11:30pm last night.)   HPI   Chief Complaint  Patient presents with   Hypertension   Mental Health Problem   Facial Pain    Pt reports pain from nose up to head since Sunday night. Pt reports vomiting from pain. Pt reports last emesis episode was 11:30pm last night.    Pt has been seeing [facility].  She says she hasn't ever considered medication but would it it might help with her anxiety and depression.  She does not have SI, HI.   Pt says she is having a lot of anxiety about her upcoming biopsy.  Part of it is the possibility of a cancer and the other thing is she says she had quite a bit of pain last time.   She still has with HA and congestion as above.  She was seen for URI on 10/17/22 and had normal cbc and cxr.     Relevant past medical, surgical, family and social history reviewed and updated as indicated. Interim medical history since our last visit reviewed. Allergies and medications reviewed and updated.    Current Outpatient Medications:    acetaminophen (TYLENOL) 500 MG tablet, Take 1,000 mg by mouth as needed., Disp: , Rfl:    atorvastatin (LIPITOR) 20 MG tablet, Take 1 tablet (20 mg total) by mouth daily., Disp: 30 tablet, Rfl: 0   cetirizine (ZYRTEC) 10 MG tablet, Take 1 tablet (10 mg total) by mouth daily as needed for allergies. Tome una tableta por boca diaria cuando sea necesario para las alergias, Disp: 90 tablet, Rfl: 0   ibuprofen (ADVIL) 200 MG tablet, Take 200 mg by mouth daily as needed.,  Disp: , Rfl:    losartan (COZAAR) 100 MG tablet, Take 1 tablet (100 mg total) by mouth daily. Tome una tableta por boca diaria, Disp: 30 tablet, Rfl: 1   omeprazole (PRILOSEC) 20 MG capsule, 1 PO BID. 1 POR BOCA DOS VECES AL DIA, Disp: 180 capsule, Rfl: 0    Review of Systems  Per HPI unless specifically indicated above     Objective:    BP 126/85   Pulse 76   Temp 97.8 F (36.6 C)   Ht 5' (1.524 m)   Wt 142 lb 4 oz (64.5 kg)   SpO2 99%   BMI 27.78 kg/m   Wt Readings from Last 3 Encounters:  10/26/22 142 lb 4 oz (64.5 kg)  10/21/22 145 lb 6.4 oz (66 kg)  09/27/22 146 lb 8 oz (66.5 kg)     Weights: 142- 10/26/22 146- 09/27/22 145- 09/13/22 150 -03/31/21 149 -09/21/20 146 -11/06/19 137 -06/28/17     Physical Exam Vitals reviewed.  Constitutional:      General: She is not in acute distress.    Appearance: She is well-developed. She is not toxic-appearing.  HENT:     Head: Normocephalic and atraumatic.     Right Ear:  Tympanic membrane, ear canal and external ear normal.     Left Ear: Tympanic membrane, ear canal and external ear normal.     Nose:     Right Sinus: Frontal sinus tenderness present.     Left Sinus: Frontal sinus tenderness present.  Cardiovascular:     Rate and Rhythm: Normal rate and regular rhythm.  Pulmonary:     Effort: Pulmonary effort is normal.     Breath sounds: Normal breath sounds.  Abdominal:     General: Bowel sounds are normal.     Palpations: Abdomen is soft. There is no mass.     Tenderness: There is no abdominal tenderness.  Musculoskeletal:     Cervical back: Neck supple.     Right lower leg: No edema.     Left lower leg: No edema.  Lymphadenopathy:     Cervical: No cervical adenopathy.  Skin:    General: Skin is warm and dry.  Neurological:     Mental Status: She is alert and oriented to person, place, and time.  Psychiatric:        Mood and Affect: Affect is flat.        Behavior: Behavior is cooperative.            Assessment & Plan:    Encounter Diagnoses  Name Primary?   Primary hypertension Yes   Acute sinusitis, recurrence not specified, unspecified location    Anxiety and depression    Mass of left breast, unspecified quadrant    Not proficient in English language       HTN -well controlled -continue losartan 100  Sinusitis -rx amoxil 500mg  tid x 10day -she is also given phenergan to use prn   Biopsy -pt is counseled to avoid worry about pathology until after the test; she had normal pathology after her previous biopsies -she is encouraged to ask the provider for rx pain med after the procedure  Anxiety/depression -pt is rescheduled with [facility] -she is started on citalopram -pt will follow up 2 weeks for recheck.  She is to contact office sooner prn worsening or new symptoms

## 2022-10-31 ENCOUNTER — Other Ambulatory Visit (HOSPITAL_COMMUNITY): Payer: Self-pay | Admitting: Obstetrics and Gynecology

## 2022-10-31 DIAGNOSIS — R928 Other abnormal and inconclusive findings on diagnostic imaging of breast: Secondary | ICD-10-CM

## 2022-11-01 ENCOUNTER — Ambulatory Visit (HOSPITAL_COMMUNITY)
Admission: RE | Admit: 2022-11-01 | Discharge: 2022-11-01 | Disposition: A | Discharge status: Home / Self Care | Payer: Self-pay | Source: Ambulatory Visit | Attending: Obstetrics and Gynecology | Admitting: Obstetrics and Gynecology

## 2022-11-01 ENCOUNTER — Encounter (HOSPITAL_COMMUNITY): Payer: Self-pay

## 2022-11-01 DIAGNOSIS — R928 Other abnormal and inconclusive findings on diagnostic imaging of breast: Secondary | ICD-10-CM

## 2022-11-01 HISTORY — PX: BREAST BIOPSY: SHX20

## 2022-11-01 MED ORDER — LIDOCAINE-EPINEPHRINE (PF) 1 %-1:200000 IJ SOLN
INTRAMUSCULAR | Status: AC
  Filled 2022-11-01: qty 30

## 2022-11-01 MED ORDER — LIDOCAINE HCL (PF) 2 % IJ SOLN
INTRAMUSCULAR | Status: AC
  Filled 2022-11-01: qty 10

## 2022-11-01 NOTE — Progress Notes (Signed)
Pt brought to [place] suite in no obvious distress. Procedure explained, consent obtained, interpretation provided. Prepped and draped in sterile manner. US imaging obtained. Local anesthetic admin without adverse reaction. Access and incision completed without complication, samples obtained. Clip placed without difficulty. Access removed, bandage applied, no bleeding or hematoma present. DC instructions provided. Escorted to waiting area to care of family.

## 2022-11-08 ENCOUNTER — Ambulatory Visit: Payer: Self-pay

## 2022-11-08 ENCOUNTER — Ambulatory Visit: Payer: Self-pay | Admitting: Physician Assistant

## 2022-12-06 ENCOUNTER — Ambulatory Visit: Payer: Self-pay

## 2022-12-06 ENCOUNTER — Encounter: Payer: Self-pay | Admitting: Physician Assistant

## 2022-12-06 ENCOUNTER — Ambulatory Visit: Payer: Self-pay | Admitting: Physician Assistant

## 2022-12-06 VITALS — BP 140/92 | HR 82 | Temp 97.8°F | Ht 60.0 in | Wt 142.0 lb

## 2022-12-06 DIAGNOSIS — Z789 Other specified health status: Secondary | ICD-10-CM

## 2022-12-06 DIAGNOSIS — F32A Depression, unspecified: Secondary | ICD-10-CM

## 2022-12-06 DIAGNOSIS — R300 Dysuria: Secondary | ICD-10-CM

## 2022-12-06 DIAGNOSIS — F419 Anxiety disorder, unspecified: Secondary | ICD-10-CM

## 2022-12-06 LAB — POCT URINALYSIS DIPSTICK
Bilirubin, UA: NEGATIVE
Glucose, UA: NEGATIVE
Leukocytes, UA: NEGATIVE
Nitrite, UA: NEGATIVE
Protein, UA: NEGATIVE
Spec Grav, UA: 1.015 (ref 1.010–1.025)
Urobilinogen, UA: 0.2 U/dL
pH, UA: 6.5 (ref 5.0–8.0)

## 2022-12-06 NOTE — Progress Notes (Signed)
BP (!) 140/92   Pulse 82   Temp 97.8 F (36.6 C)   Ht 5' (1.524 m)   Wt 142 lb (64.4 kg)   SpO2 98%   BMI 27.73 kg/m    Subjective:    Patient ID: Joyce Roberts, female    DOB: 1970-05-18, 52 y.o.   MRN: [id number]  HPI: Joyce Roberts is a 52 y.o. female presenting on 12/06/2022 for Mental Health Problem (Pt mentions she has 8 grandchildren in her home. Pt mentions feeling worried about one of her grandchildren - he gets convulsions when he is sick.) and Dysuria (Pt c/o burning when she urinates)   HPI  Chief Complaint  Patient presents with   Mental Health Problem    Pt mentions she has 8 grandchildren in her home. Pt mentions feeling worried about one of her grandchildren - he gets convulsions when he is sick.   Dysuria    Pt c/o burning when she urinates     Pt started on citalopram at last OV.  She was no-show to f/u to recheck that 2 wk after re-starting. She says It has helped her moodies.  She is sleeping better also.       Relevant past medical, surgical, family and social history reviewed and updated as indicated. Interim medical history since our last visit reviewed. Allergies and medications reviewed and updated.   Current Outpatient Medications:    acetaminophen (TYLENOL) 500 MG tablet, Take 1,000 mg by mouth as needed., Disp: , Rfl:    atorvastatin (LIPITOR) 20 MG tablet, Take 1 tablet (20 mg total) by mouth daily., Disp: 90 tablet, Rfl: 0   cetirizine (ZYRTEC) 10 MG tablet, Take 1 tablet (10 mg total) by mouth daily as needed for allergies. Tome una tableta por boca diaria cuando sea necesario para las [facility], Disp: 90 tablet, Rfl: 0   citalopram (CELEXA) 20 MG tablet, Take 1 tablet (20 mg total) by mouth daily., Disp: 90 tablet, Rfl: 0   ibuprofen (ADVIL) 200 MG tablet, Take 200 mg by mouth daily as needed., Disp: , Rfl:    losartan (COZAAR) 100 MG tablet, Take 1 tablet (100 mg total) by mouth daily. Tome una tableta por boca diaria, Disp: 90  tablet, Rfl: 1   omeprazole (PRILOSEC) 20 MG capsule, 1 PO BID. 1 POR BOCA DOS VECES AL DIA, Disp: 180 capsule, Rfl: 0   promethazine (PHENERGAN) 25 MG tablet, Take 1 tablet (25 mg total) by mouth every 8 (eight) hours as needed for nausea or vomiting. (Patient not taking: Reported on 12/06/2022), Disp: 10 tablet, Rfl: 0    Review of Systems  Per HPI unless specifically indicated above     Objective:    BP (!) 140/92   Pulse 82   Temp 97.8 F (36.6 C)   Ht 5' (1.524 m)   Wt 142 lb (64.4 kg)   SpO2 98%   BMI 27.73 kg/m   Wt Readings from Last 3 Encounters:  12/06/22 142 lb (64.4 kg)  10/26/22 142 lb 4 oz (64.5 kg)  10/21/22 145 lb 6.4 oz (66 kg)    Physical Exam Vitals reviewed.  Constitutional:      General: She is not in acute distress.    Appearance: She is well-developed. She is not toxic-appearing.  HENT:     Head: Normocephalic and atraumatic.  Cardiovascular:     Rate and Rhythm: Normal rate and regular rhythm.  Pulmonary:     Effort: Pulmonary effort is normal.  Breath sounds: Normal breath sounds.  Abdominal:     General: Bowel sounds are normal.     Palpations: Abdomen is soft. There is no mass.     Tenderness: There is no abdominal tenderness. There is no right CVA tenderness, left CVA tenderness, guarding or rebound.  Musculoskeletal:     Cervical back: Neck supple.     Right lower leg: No edema.     Left lower leg: No edema.  Lymphadenopathy:     Cervical: No cervical adenopathy.  Skin:    General: Skin is warm and dry.  Neurological:     Mental Status: She is alert and oriented to person, place, and time.  Psychiatric:        Behavior: Behavior normal.     UA noted      Assessment & Plan:     Encounter Diagnoses  Name Primary?   Anxiety and depression Yes   Dysuria    Not proficient in English language      She agrees that shhe is doing better on the citalopram and would like to continue it.  She has appt with [facility] today Pt  encouraged to increase water and avoid sodas.  If her dysuria persists, RTO  Pt to follow up 2 months.  She is to contact office sooner prn
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
## 2022-12-13 ENCOUNTER — Ambulatory Visit (INDEPENDENT_AMBULATORY_CARE_PROVIDER_SITE_OTHER): Payer: Self-pay | Admitting: General Surgery

## 2022-12-13 ENCOUNTER — Other Ambulatory Visit (HOSPITAL_COMMUNITY): Payer: Self-pay | Admitting: General Surgery

## 2022-12-13 ENCOUNTER — Encounter: Payer: Self-pay | Admitting: General Surgery

## 2022-12-13 VITALS — BP 135/91 | HR 77 | Temp 97.8°F | Resp 12 | Ht 60.0 in | Wt 145.0 lb

## 2022-12-13 DIAGNOSIS — D242 Benign neoplasm of left breast: Secondary | ICD-10-CM

## 2022-12-13 DIAGNOSIS — D369 Benign neoplasm, unspecified site: Secondary | ICD-10-CM
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
## 2022-12-13 NOTE — Patient Instructions (Addendum)
   Ser necesario obtener una etiqueta de radiofrecuencia y discutir esto con el radilogo sobre qu ser mejor y si necesitamos ms de una etiqueta.   Luego se realizar una biopsia por escisin bajo anestesia.  Will need to get a radiofrequency tag and discuss this with the radiologist what will be best and if we need more than one tag.   Then will do an excisional biopsy under anesthesia.
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
## 2022-12-27 ENCOUNTER — Ambulatory Visit: Payer: Self-pay

## 2022-12-27 DIAGNOSIS — F419 Anxiety disorder, unspecified: Secondary | ICD-10-CM

## 2022-12-27 NOTE — Progress Notes (Signed)
This is session # 3 with Joyce Roberts for individual [facility] services for anxiety and depression. Patient presents alone with Spanish interpreter. BHP spent 55 minutes with patient.    Patient reports anxious symptoms including intrusive worries and autonomic hyperactivity and depressive symptoms including tiredness, lack of motivation, hopelessness, irritability, decreased interest, and sadness. Symptoms are related to chronic pain, poor sleep and eating patterns, recent health concerns, and grief/trauma hx. Patient will likely benefit from individual support services to aid coping, processing, and strengths-based perspective with referral to Spanish-speaking provider for specialized trauma counseling.   This procedure has been fully reviewed with the patient and informed consent has been obtained.   Patient location: Pt seen in clinic via telehealth.   I connected with Joyce Roberts on 12/27/22 by a video enabled telemedicine application and verified that I am speaking with the correct person.   I discussed the limitations of evaluation and management by telemedicine. The patient expressed understanding and agreed to proceed.     ASSESSMENT AND PLAN   Current diagnosis: anxiety and depression   We created the following treatment plan at today's appointment:               1)  12/27/22               2)  Decrease anxious and depressive symptoms through increased pain management, support, processing, and coping and self-care skills               3)  Medication management with Provider. On 12/06/22, pt reports a felt improvement in depressive symptoms overall attributed to medication.               4)  Next screening due in 4 sessions.     HISTORY OF PRESENT ILLNESS   Mental Status: Joyce Roberts presented oriented X4. Mood and affect were anxious and depressed. Judgment was appropriate. Memory was appropriate. Thought processes and content were  appropriate. Speech was appropriate. Patient denies SI/HI.   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  Narrative: Met with pt for scheduled appointment. Person-centered approach used to continue therapeutic alliance. Pt provides updates since previous visit including increased motivation and felt improvement overall attributed to behavioral health services. Pt notes continued desire for [facility] to follow-up with Provider about referral to neurology for possible TBI impacting focus, with which BHP agrees to follow-up. Discussed referral option for complex trauma hx identified last visit. Per understanding, provided pt with contact for Louanne Skye, a Spanish-speaking counselor specialized in trauma work and EMDR with flexible fees and telehealth available. Pt agreed to reach out to referral to establish care. Psychoeducation provided around trauma and panic attacks as desired. Assessed for other concerns pt wished to address today, which they denied. Scheduled follow up.   Effectiveness of the intervention(s) and the beneficiary's response or progress toward goal(s):  Patient is in a contemplative stage of change to engage in treatment plan.

## 2023-01-03 ENCOUNTER — Ambulatory Visit (HOSPITAL_COMMUNITY)
Admission: RE | Admit: 2023-01-03 | Discharge: 2023-01-03 | Disposition: A | Discharge status: Home / Self Care | Payer: Self-pay | Source: Ambulatory Visit | Attending: General Surgery | Admitting: General Surgery

## 2023-01-03 DIAGNOSIS — D242 Benign neoplasm of left breast: Secondary | ICD-10-CM | POA: Insufficient documentation

## 2023-01-03 HISTORY — PX: BREAST BIOPSY: SHX20

## 2023-01-03 MED ORDER — LIDOCAINE HCL (PF) 2 % IJ SOLN
INTRAMUSCULAR | Status: AC
  Filled 2023-01-03: qty 20

## 2023-01-03 NOTE — Patient Instructions (Signed)
Joyce Roberts  01/03/2023     @PREFPERIOPPHARMACY @   Your procedure is scheduled on  01/06/2023.   Report to [facility] at  0600 A.M.   Call this number if you have problems the morning of surgery:  [phone]  If you experience any cold or flu symptoms such as cough, fever, chills, shortness of breath, etc. between now and your scheduled surgery, please notify us at the above number.   Remember:  Do not eat after midnight.    You may drink clear liquids until 0330 am on 01/06/2023.    Clear liquids allowed are:                    Water, Carbonated beverages (diabetics please choose diet or no sugar options), Black Coffee Only (No creamer, milk or cream, including half & half and powdered creamer), and Clear Sports drink (No red color; diabetics please choose diet or no sugar options)    Take these medicines the morning of surgery with A SIP OF WATER                               citalopram, omeprazole.     Do not wear jewelry, make-up or nail polish, including gel polish,  artificial nails, or any other type of covering on natural nails (fingers and  toes).  Do not wear lotions, powders, or perfumes, or deodorant.  Do not shave 48 hours prior to surgery.  Men may shave face and neck.  Do not bring valuables to the hospital.  [facility] is not responsible for any belongings or valuables.  Contacts, dentures or bridgework may not be worn into surgery.  Leave your suitcase in the car.  After surgery it may be brought to your room.  For patients admitted to the hospital, discharge time will be determined by your treatment team.  Patients discharged the day of surgery will not be allowed to drive home and must have someone with them for 24 hours.    Special instructions:   DO NOT smoke tobacco or vape for 24 hours before your procedure.  Please read over the following fact sheets that you were given. Coughing and Deep Breathing, Surgical Site Infection  Prevention, Anesthesia Post-op Instructions, and Care and Recovery After Surgery       Breast Biopsy, Care After The following information offers guidance on how to care for yourself after your breast biopsy. Your doctor may also give you more specific instructions. If you have problems or questions, contact your doctor. What can I expect after the procedure? After a breast biopsy, it is common to have: Bruising on your breast. Breast swelling. Numbness, tingling, or pain near your biopsy site. This site is where tissue was taken out for study. Follow these instructions at home: Medicines Take over-the-counter and prescription medicines only as told by your doctor. If you were given a sedative during your procedure, do not drive or use machines until your doctor says that it is safe. A sedative is a medicine that helps you relax. Do not drink alcohol while taking pain medicine. Ask your doctor if you should avoid driving or using machines while you are taking your medicine. Biopsy site care     Follow instructions from your doctor about how to take care of your cut from surgery (incision) or your puncture site. Make sure you: Wash your hands  with soap and water for at least 20 seconds before and after you change your bandage. If you cannot use soap and water, use hand sanitizer. Change your bandage. Leave stitches or skin glue in place for at least 2 weeks. Leave tape strips alone unless you are told to take them off. You may trim the edges of the tape strips if they curl up. If you have stitches, keep them dry when you take a bath or a shower. Check your cut or puncture site every day for signs of infection. Look for: More redness, swelling, or pain. More fluid or blood. Warmth. Pus or a bad smell. Protect the biopsy site. Do not let the site get bumped. Managing pain If told, put ice on the biopsy site. To do this: Put ice in a plastic bag. Place a towel between your skin and  the bag. Leave the ice on for 20 minutes, 2-3 times a day. Take off the ice if your skin turns bright red. This is very important. If you cannot feel pain, heat, or cold, you have a greater risk of damage to the area. Activity If a cut was made in your skin to do the biopsy, avoid activities that could pull your cut open. These include: Stretching. Reaching over your head. Exercise. Sports. Lifting anything that weighs more than 3 lb (1.4 kg). Return to your normal activities when your doctor says that it is safe. General instructions Follow your normal diet. Wear a good support bra for as long as told by your doctor. Get checked for extra fluid around your lymph nodes (lymphedema) as often as told. Do not smoke or use any products that contain nicotine or tobacco. If you need help quitting, ask your doctor. Keep all follow-up visits. Contact a doctor if: You notice any of these at or near the biopsy site: More redness, swelling, or pain. More fluid or blood. Warmth. Pus or a bad smell. The site breaking open after the stitches or skin tape strips have been removed. You have a rash or a fever. Get help right away if: You have trouble breathing. You have red streaks around the biopsy site. Summary After a breast biopsy, it is common to have bruising, numbness, tingling, or pain near your biopsy site. Ask your doctor if you should avoid driving or using machines while you are taking your medicine. If you had a cut made in your skin to do the biopsy, avoid activities that may pull the cut open. Return to your normal activities when your doctor says that it is safe. Wear a good support bra for as long as told by your doctor. This information is not intended to replace advice given to you by your health care provider. Make sure you discuss any questions you have with your health care provider. Document Revised: 01/06/2021 Document Reviewed: 01/06/2021 Elsevier Patient Education  2024  [employer] General Anesthesia, Adult, Care After The following information offers guidance on how to care for yourself after your procedure. Your health care provider may also give you more specific instructions. If you have problems or questions, contact your health care provider. What can I expect after the procedure? After the procedure, it is common for people to: Have pain or discomfort at the IV site. Have nausea or vomiting. Have a sore throat or hoarseness. Have trouble concentrating. Feel cold or chills. Feel weak, sleepy, or tired (fatigue). Have soreness and body aches. These can affect parts of the body that were not  involved in surgery. Follow these instructions at home: For the time period you were told by your health care provider:  Rest. Do not participate in activities where you could fall or become injured. Do not drive or use machinery. Do not drink alcohol. Do not take sleeping pills or medicines that cause drowsiness. Do not make important decisions or sign legal documents. Do not take care of children on your own. General instructions Drink enough fluid to keep your urine pale yellow. If you have sleep apnea, surgery and certain medicines can increase your risk for breathing problems. Follow instructions from your health care provider about wearing your sleep device: Anytime you are sleeping, including during daytime naps. While taking prescription pain medicines, sleeping medicines, or medicines that make you drowsy. Return to your normal activities as told by your health care provider. Ask your health care provider what activities are safe for you. Take over-the-counter and prescription medicines only as told by your health care provider. Do not use any products that contain nicotine or tobacco. These products include cigarettes, chewing tobacco, and vaping devices, such as e-cigarettes. These can delay incision healing after surgery. If you need help quitting,  ask your health care provider. Contact a health care provider if: You have nausea or vomiting that does not get better with medicine. You vomit every time you eat or drink. You have pain that does not get better with medicine. You cannot urinate or have bloody urine. You develop a skin rash. You have a fever. Get help right away if: You have trouble breathing. You have chest pain. You vomit blood. These symptoms may be an emergency. Get help right away. Call 911. Do not wait to see if the symptoms will go away. Do not drive yourself to the hospital. Summary After the procedure, it is common to have a sore throat, hoarseness, nausea, vomiting, or to feel weak, sleepy, or fatigue. For the time period you were told by your health care provider, do not drive or use machinery. Get help right away if you have difficulty breathing, have chest pain, or vomit blood. These symptoms may be an emergency. This information is not intended to replace advice given to you by your health care provider. Make sure you discuss any questions you have with your health care provider. Document Revised: 06/11/2021 Document Reviewed: 06/11/2021 Elsevier Patient Education  2024 [employer] How to Use Chlorhexidine Before Surgery Chlorhexidine gluconate (CHG) is a germ-killing (antiseptic) solution that is used to clean the skin. It can get rid of the bacteria that normally live on the skin and can keep them away for about 24 hours. To clean your skin with CHG, you may be given: A CHG solution to use in the shower or as part of a sponge bath. A prepackaged cloth that contains CHG. Cleaning your skin with CHG may help lower the risk for infection: While you are staying in the intensive care unit of the hospital. If you have a vascular access, such as a central line, to provide short-term or long-term access to your veins. If you have a catheter to drain urine from your bladder. If you are on a ventilator. A  ventilator is a machine that helps you breathe by moving air in and out of your lungs. After surgery. What are the risks? Risks of using CHG include: A skin reaction. Hearing loss, if CHG gets in your ears and you have a perforated eardrum. Eye injury, if CHG gets in your eyes and  is not rinsed out. The CHG product catching fire. Make sure that you avoid smoking and flames after applying CHG to your skin. Do not use CHG: If you have a chlorhexidine allergy or have previously reacted to chlorhexidine. On babies younger than 10 months of age. How to use CHG solution Use CHG only as told by your health care provider, and follow the instructions on the label. Use the full amount of CHG as directed. Usually, this is one bottle. During a shower Follow these steps when using CHG solution during a shower (unless your health care provider gives you different instructions): Start the shower. Use your normal soap and shampoo to wash your face and hair. Turn off the shower or move out of the shower stream. Pour the CHG onto a clean washcloth. Do not use any type of brush or rough-edged sponge. Starting at your neck, lather your body down to your toes. Make sure you follow these instructions: If you will be having surgery, pay special attention to the part of your body where you will be having surgery. Scrub this area for at least 1 minute. Do not use CHG on your head or face. If the solution gets into your ears or eyes, rinse them well with water. Avoid your genital area. Avoid any areas of skin that have broken skin, cuts, or scrapes. Scrub your back and under your arms. Make sure to wash skin folds. Let the lather sit on your skin for 1-2 minutes or as long as told by your health care provider. Thoroughly rinse your entire body in the shower. Make sure that all body creases and crevices are rinsed well. Dry off with a clean towel. Do not put any substances on your body afterward--such as powder,  lotion, or perfume--unless you are told to do so by your health care provider. Only use lotions that are recommended by the manufacturer. Put on clean clothes or pajamas. If it is the night before your surgery, sleep in clean sheets.  During a sponge bath Follow these steps when using CHG solution during a sponge bath (unless your health care provider gives you different instructions): Use your normal soap and shampoo to wash your face and hair. Pour the CHG onto a clean washcloth. Starting at your neck, lather your body down to your toes. Make sure you follow these instructions: If you will be having surgery, pay special attention to the part of your body where you will be having surgery. Scrub this area for at least 1 minute. Do not use CHG on your head or face. If the solution gets into your ears or eyes, rinse them well with water. Avoid your genital area. Avoid any areas of skin that have broken skin, cuts, or scrapes. Scrub your back and under your arms. Make sure to wash skin folds. Let the lather sit on your skin for 1-2 minutes or as long as told by your health care provider. Using a different clean, wet washcloth, thoroughly rinse your entire body. Make sure that all body creases and crevices are rinsed well. Dry off with a clean towel. Do not put any substances on your body afterward--such as powder, lotion, or perfume--unless you are told to do so by your health care provider. Only use lotions that are recommended by the manufacturer. Put on clean clothes or pajamas. If it is the night before your surgery, sleep in clean sheets. How to use CHG prepackaged cloths Only use CHG cloths as told by your health  care provider, and follow the instructions on the label. Use the CHG cloth on clean, dry skin. Do not use the CHG cloth on your head or face unless your health care provider tells you to. When washing with the CHG cloth: Avoid your genital area. Avoid any areas of skin that have  broken skin, cuts, or scrapes. Before surgery Follow these steps when using a CHG cloth to clean before surgery (unless your health care provider gives you different instructions): Using the CHG cloth, vigorously scrub the part of your body where you will be having surgery. Scrub using a back-and-forth motion for 3 minutes. The area on your body should be completely wet with CHG when you are done scrubbing. Do not rinse. Discard the cloth and let the area air-dry. Do not put any substances on the area afterward, such as powder, lotion, or perfume. Put on clean clothes or pajamas. If it is the night before your surgery, sleep in clean sheets.  For general bathing Follow these steps when using CHG cloths for general bathing (unless your health care provider gives you different instructions). Use a separate CHG cloth for each area of your body. Make sure you wash between any folds of skin and between your fingers and toes. Wash your body in the following order, switching to a new cloth after each step: The front of your neck, shoulders, and chest. Both of your arms, under your arms, and your hands. Your stomach and groin area, avoiding the genitals. Your right leg and foot. Your left leg and foot. The back of your neck, your back, and your buttocks. Do not rinse. Discard the cloth and let the area air-dry. Do not put any substances on your body afterward--such as powder, lotion, or perfume--unless you are told to do so by your health care provider. Only use lotions that are recommended by the manufacturer. Put on clean clothes or pajamas. Contact a health care provider if: Your skin gets irritated after scrubbing. You have questions about using your solution or cloth. You swallow any chlorhexidine. Call your local poison control center ([phone] in the [place] Get help right away if: Your eyes itch badly, or they become very red or swollen. Your skin itches badly and is red or  swollen. Your hearing changes. You have trouble seeing. You have swelling or tingling in your mouth or throat. You have trouble breathing. These symptoms may represent a serious problem that is an emergency. Do not wait to see if the symptoms will go away. Get medical help right away. Call your local emergency services (911 in the [place] Do not drive yourself to the hospital. Summary Chlorhexidine gluconate (CHG) is a germ-killing (antiseptic) solution that is used to clean the skin. Cleaning your skin with CHG may help to lower your risk for infection. You may be given CHG to use for bathing. It may be in a bottle or in a prepackaged cloth to use on your skin. Carefully follow your health care provider's instructions and the instructions on the product label. Do not use CHG if you have a chlorhexidine allergy. Contact your health care provider if your skin gets irritated after scrubbing. This information is not intended to replace advice given to you by your health care provider. Make sure you discuss any questions you have with your health care provider. Document Revised: 07/12/2021 Document Reviewed: 05/25/2020 Elsevier Patient Education  2023 [employer].

## 2023-01-04 ENCOUNTER — Encounter (HOSPITAL_COMMUNITY)
Admission: RE | Admit: 2023-01-04 | Discharge: 2023-01-04 | Disposition: A | Discharge status: Home / Self Care | Payer: Self-pay | Source: Ambulatory Visit | Attending: General Surgery | Admitting: General Surgery

## 2023-01-04 ENCOUNTER — Encounter (HOSPITAL_COMMUNITY): Payer: Self-pay

## 2023-01-04 VITALS — BP 147/88 | HR 77 | Temp 97.8°F | Resp 18 | Ht 60.0 in | Wt 145.1 lb

## 2023-01-04 DIAGNOSIS — D649 Anemia, unspecified: Secondary | ICD-10-CM

## 2023-01-04 DIAGNOSIS — I10 Essential (primary) hypertension: Secondary | ICD-10-CM

## 2023-01-04 DIAGNOSIS — Z01818 Encounter for other preprocedural examination: Secondary | ICD-10-CM

## 2023-01-04 HISTORY — DX: Unspecified chronic bronchitis: J42

## 2023-01-04 HISTORY — DX: Other complications of anesthesia, initial encounter: T88.59XA

## 2023-01-04 HISTORY — DX: Depression, unspecified: F32.A

## 2023-01-04 LAB — CBC WITH DIFFERENTIAL/PLATELET
Abs Immature Granulocytes: 0.07 10*3/uL (ref 0.00–0.07)
Basophils Absolute: 0 10*3/uL (ref 0.0–0.1)
Basophils Relative: 1 %
Eosinophils Absolute: 0.1 10*3/uL (ref 0.0–0.5)
Eosinophils Relative: 1 %
HCT: 41 % (ref 36.0–46.0)
Hemoglobin: 13.5 g/dL (ref 12.0–15.0)
Immature Granulocytes: 1 %
Lymphocytes Relative: 32 %
Lymphs Abs: 2 10*3/uL (ref 0.7–4.0)
MCH: 27.8 pg (ref 26.0–34.0)
MCHC: 32.9 g/dL (ref 30.0–36.0)
MCV: 84.4 fL (ref 80.0–100.0)
Monocytes Absolute: 0.5 10*3/uL (ref 0.1–1.0)
Monocytes Relative: 8 %
Neutro Abs: 3.6 10*3/uL (ref 1.7–7.7)
Neutrophils Relative %: 57 %
Platelets: 223 10*3/uL (ref 150–400)
RBC: 4.86 MIL/uL (ref 3.87–5.11)
RDW: 15 % (ref 11.5–15.5)
WBC: 6.2 10*3/uL (ref 4.0–10.5)
nRBC: 0 % (ref 0.0–0.2)

## 2023-01-04 LAB — POCT PREGNANCY, URINE: Preg Test, Ur: NEGATIVE

## 2023-01-06 ENCOUNTER — Ambulatory Visit (HOSPITAL_COMMUNITY): Payer: Self-pay

## 2023-01-06 ENCOUNTER — Encounter (HOSPITAL_COMMUNITY)
Admission: RE | Disposition: A | Discharge status: Home / Self Care | Payer: Self-pay | Source: Home / Self Care | Attending: General Surgery

## 2023-01-06 ENCOUNTER — Ambulatory Visit (HOSPITAL_BASED_OUTPATIENT_CLINIC_OR_DEPARTMENT_OTHER): Payer: Self-pay | Admitting: Certified Registered Nurse Anesthetist

## 2023-01-06 ENCOUNTER — Other Ambulatory Visit: Payer: Self-pay

## 2023-01-06 ENCOUNTER — Ambulatory Visit (HOSPITAL_COMMUNITY): Payer: Self-pay | Admitting: Certified Registered Nurse Anesthetist

## 2023-01-06 ENCOUNTER — Encounter (HOSPITAL_COMMUNITY): Payer: Self-pay | Admitting: General Surgery

## 2023-01-06 ENCOUNTER — Ambulatory Visit (HOSPITAL_COMMUNITY)
Admission: RE | Admit: 2023-01-06 | Discharge: 2023-01-06 | Disposition: A | Discharge status: Home / Self Care | Payer: Self-pay | Attending: General Surgery | Admitting: General Surgery

## 2023-01-06 DIAGNOSIS — D369 Benign neoplasm, unspecified site: Secondary | ICD-10-CM | POA: Diagnosis present

## 2023-01-06 DIAGNOSIS — N6032 Fibrosclerosis of left breast: Secondary | ICD-10-CM | POA: Insufficient documentation

## 2023-01-06 DIAGNOSIS — Z803 Family history of malignant neoplasm of breast: Secondary | ICD-10-CM | POA: Insufficient documentation

## 2023-01-06 DIAGNOSIS — K219 Gastro-esophageal reflux disease without esophagitis: Secondary | ICD-10-CM | POA: Insufficient documentation

## 2023-01-06 DIAGNOSIS — D242 Benign neoplasm of left breast: Secondary | ICD-10-CM

## 2023-01-06 DIAGNOSIS — I10 Essential (primary) hypertension: Secondary | ICD-10-CM | POA: Insufficient documentation

## 2023-01-06 DIAGNOSIS — N6092 Unspecified benign mammary dysplasia of left breast: Secondary | ICD-10-CM | POA: Insufficient documentation

## 2023-01-06 HISTORY — PX: BREAST BIOPSY WITH RADIO FREQUENCY LOCALIZER: SHX6895

## 2023-01-06 SURGERY — BREAST BIOPSY WITH RADIO FREQUENCY LOCALIZER
Anesthesia: General | Site: Breast | Laterality: Left

## 2023-01-06 MED ORDER — CEFAZOLIN SODIUM-DEXTROSE 2-4 GM/100ML-% IV SOLN
2.0000 g | INTRAVENOUS | Status: AC
  Administered 2023-01-06: 2 g via INTRAVENOUS

## 2023-01-06 MED ORDER — BUPIVACAINE HCL (PF) 0.5 % IJ SOLN
INTRAMUSCULAR | Status: AC
  Filled 2023-01-06: qty 30

## 2023-01-06 MED ORDER — OXYCODONE HCL 5 MG PO TABS
5.0000 mg | ORAL_TABLET | ORAL | 0 refills | Status: DC | PRN
Start: 2023-01-06 — End: 2023-01-12

## 2023-01-06 MED ORDER — CHLORHEXIDINE GLUCONATE CLOTH 2 % EX PADS
6.0000 | MEDICATED_PAD | Freq: Once | CUTANEOUS | Status: AC
  Administered 2023-01-06: 6 via TOPICAL

## 2023-01-06 MED ORDER — LIDOCAINE HCL (CARDIAC) PF 100 MG/5ML IV SOSY
PREFILLED_SYRINGE | INTRAVENOUS | Status: DC | PRN
  Administered 2023-01-06: 60 mg via INTRAVENOUS

## 2023-01-06 MED ORDER — PROMETHAZINE HCL 12.5 MG PO TABS: 25.0000 mg | ORAL_TABLET | Freq: Four times a day (QID) | ORAL | 0 refills | Status: AC | PRN

## 2023-01-06 MED ORDER — MIDAZOLAM HCL 2 MG/2ML IJ SOLN
INTRAMUSCULAR | Status: AC
  Filled 2023-01-06: qty 2

## 2023-01-06 MED ORDER — OXYCODONE HCL 5 MG/5ML PO SOLN: 5.0000 mg | Freq: Once | ORAL | Status: AC | PRN

## 2023-01-06 MED ORDER — OXYCODONE HCL 5 MG PO TABS
5.0000 mg | ORAL_TABLET | Freq: Once | ORAL | Status: AC | PRN
  Administered 2023-01-06: 5 mg via ORAL
  Filled 2023-01-06: qty 1

## 2023-01-06 MED ORDER — MIDAZOLAM HCL 2 MG/2ML IJ SOLN
INTRAMUSCULAR | Status: DC | PRN
  Administered 2023-01-06: 2 mg via INTRAVENOUS

## 2023-01-06 MED ORDER — PHENYLEPHRINE 80 MCG/ML (10ML) SYRINGE FOR IV PUSH (FOR BLOOD PRESSURE SUPPORT)
PREFILLED_SYRINGE | INTRAVENOUS | Status: AC
  Filled 2023-01-06: qty 10

## 2023-01-06 MED ORDER — SCOPOLAMINE 1 MG/3DAYS TD PT72: 1.0000 | MEDICATED_PATCH | TRANSDERMAL | Status: DC

## 2023-01-06 MED ORDER — PROPOFOL 10 MG/ML IV BOLUS
INTRAVENOUS | Status: AC
  Filled 2023-01-06: qty 20

## 2023-01-06 MED ORDER — PROPOFOL 500 MG/50ML IV EMUL
INTRAVENOUS | Status: AC
  Filled 2023-01-06: qty 50

## 2023-01-06 MED ORDER — FENTANYL CITRATE (PF) 250 MCG/5ML IJ SOLN
INTRAMUSCULAR | Status: AC
  Filled 2023-01-06: qty 5

## 2023-01-06 MED ORDER — CHLORHEXIDINE GLUCONATE 0.12 % MT SOLN: 15.0000 mL | Freq: Once | OROMUCOSAL | Status: DC

## 2023-01-06 MED ORDER — ONDANSETRON HCL 4 MG/2ML IJ SOLN
INTRAMUSCULAR | Status: DC | PRN
  Administered 2023-01-06: 4 mg via INTRAVENOUS

## 2023-01-06 MED ORDER — ONDANSETRON HCL 4 MG/2ML IJ SOLN
INTRAMUSCULAR | Status: AC
  Filled 2023-01-06: qty 2

## 2023-01-06 MED ORDER — PHENYLEPHRINE 80 MCG/ML (10ML) SYRINGE FOR IV PUSH (FOR BLOOD PRESSURE SUPPORT)
PREFILLED_SYRINGE | INTRAVENOUS | Status: DC | PRN
Start: 2023-01-06 — End: 2023-01-06
  Administered 2023-01-06 (×2): 80 ug via INTRAVENOUS
  Administered 2023-01-06: 160 ug via INTRAVENOUS
  Administered 2023-01-06 (×3): 80 ug via INTRAVENOUS

## 2023-01-06 MED ORDER — GLYCOPYRROLATE PF 0.2 MG/ML IJ SOSY
PREFILLED_SYRINGE | INTRAMUSCULAR | Status: AC
  Filled 2023-01-06: qty 1

## 2023-01-06 MED ORDER — FENTANYL CITRATE PF 50 MCG/ML IJ SOSY
25.0000 ug | PREFILLED_SYRINGE | INTRAMUSCULAR | Status: DC | PRN
  Administered 2023-01-06: 50 ug via INTRAVENOUS
  Filled 2023-01-06: qty 1

## 2023-01-06 MED ORDER — BUPIVACAINE HCL (PF) 0.5 % IJ SOLN
INTRAMUSCULAR | Status: DC | PRN
  Administered 2023-01-06: 30 mL

## 2023-01-06 MED ORDER — ONDANSETRON HCL 4 MG/2ML IJ SOLN: 4.0000 mg | Freq: Once | INTRAMUSCULAR | Status: DC | PRN

## 2023-01-06 MED ORDER — SCOPOLAMINE 1 MG/3DAYS TD PT72
MEDICATED_PATCH | TRANSDERMAL | Status: AC
  Administered 2023-01-06: 1.5 mg via TRANSDERMAL
  Filled 2023-01-06: qty 1

## 2023-01-06 MED ORDER — PROPOFOL 10 MG/ML IV BOLUS
INTRAVENOUS | Status: DC | PRN
  Administered 2023-01-06: 50 ug/kg/min via INTRAVENOUS
  Administered 2023-01-06: 150 mg via INTRAVENOUS

## 2023-01-06 MED ORDER — FENTANYL CITRATE (PF) 250 MCG/5ML IJ SOLN
INTRAMUSCULAR | Status: DC | PRN
  Administered 2023-01-06 (×4): 50 ug via INTRAVENOUS

## 2023-01-06 MED ORDER — CHLORHEXIDINE GLUCONATE 0.12 % MT SOLN
15.0000 mL | Freq: Once | OROMUCOSAL | Status: AC
  Administered 2023-01-06: 15 mL via OROMUCOSAL

## 2023-01-06 MED ORDER — SODIUM CHLORIDE 0.9 % IR SOLN
Status: DC | PRN
  Administered 2023-01-06: 1000 mL

## 2023-01-06 MED ORDER — ORAL CARE MOUTH RINSE: 15.0000 mL | Freq: Once | OROMUCOSAL | Status: DC

## 2023-01-06 MED ORDER — LACTATED RINGERS IV SOLN: INTRAVENOUS | Status: DC

## 2023-01-06 MED ORDER — LIDOCAINE HCL (PF) 2 % IJ SOLN
INTRAMUSCULAR | Status: AC
  Filled 2023-01-06: qty 5

## 2023-01-06 MED ORDER — CEFAZOLIN SODIUM-DEXTROSE 2-4 GM/100ML-% IV SOLN
INTRAVENOUS | Status: AC
  Filled 2023-01-06: qty 100

## 2023-01-06 MED ORDER — CHLORHEXIDINE GLUCONATE CLOTH 2 % EX PADS: 6.0000 | MEDICATED_PAD | Freq: Once | CUTANEOUS | Status: DC

## 2023-01-06 SURGICAL SUPPLY — 33 items
ADH SKN CLS APL DERMABOND .7 (GAUZE/BANDAGES/DRESSINGS) ×1
APL PRP STRL LF DISP 70% ISPRP (MISCELLANEOUS) ×1
BLADE SURG 15 STRL LF DISP TIS (BLADE) ×1 IMPLANT
BLADE SURG 15 STRL SS (BLADE) ×1
CHLORAPREP W/TINT 26 (MISCELLANEOUS) ×1 IMPLANT
CLOTH BEACON ORANGE TIMEOUT ST (SAFETY) ×1 IMPLANT
COVER LIGHT HANDLE STERIS (MISCELLANEOUS) ×2 IMPLANT
DERMABOND ADVANCED .7 DNX12 (GAUZE/BANDAGES/DRESSINGS) IMPLANT
DEVICE DUBIN W/COMP PLATE 8390 (MISCELLANEOUS) ×1 IMPLANT
ELECT REM PT RETURN 9FT ADLT (ELECTROSURGICAL) ×1
ELECTRODE REM PT RTRN 9FT ADLT (ELECTROSURGICAL) ×1 IMPLANT
GLOVE BIO SURGEON STRL SZ 6.5 (GLOVE) ×1 IMPLANT
GLOVE BIOGEL PI IND STRL 6.5 (GLOVE) ×1 IMPLANT
GLOVE BIOGEL PI IND STRL 7.0 (GLOVE) ×2 IMPLANT
GOWN STRL REUS W/TWL LRG LVL3 (GOWN DISPOSABLE) ×3 IMPLANT
KIT MARKER MARGIN INK (KITS) IMPLANT
KIT TURNOVER KIT A (KITS) ×1 IMPLANT
MANIFOLD NEPTUNE II (INSTRUMENTS) ×1 IMPLANT
NDL HYPO 18GX1.5 BLUNT FILL (NEEDLE) ×1 IMPLANT
NDL HYPO 25X1 1.5 SAFETY (NEEDLE) ×1 IMPLANT
NEEDLE HYPO 18GX1.5 BLUNT FILL (NEEDLE) ×1
NEEDLE HYPO 25X1 1.5 SAFETY (NEEDLE) ×1
NS IRRIG 1000ML POUR BTL (IV SOLUTION) ×1 IMPLANT
PACK MINOR (CUSTOM PROCEDURE TRAY) ×1 IMPLANT
PAD ARMBOARD 7.5X6 YLW CONV (MISCELLANEOUS) ×1 IMPLANT
POSITIONER HEAD 8X9X4 ADT (SOFTGOODS) ×1 IMPLANT
SET BASIN LINEN APH (SET/KITS/TRAYS/PACK) ×1 IMPLANT
SET LOCALIZER 20 PROBE US (MISCELLANEOUS) ×1 IMPLANT
SUT MNCRL AB 4-0 PS2 18 (SUTURE) ×1 IMPLANT
SUT SILK 2 0 SH (SUTURE) ×1 IMPLANT
SUT VIC AB 3-0 SH 27 (SUTURE) ×2
SUT VIC AB 3-0 SH 27X BRD (SUTURE) ×1 IMPLANT
SYR CONTROL 10ML LL (SYRINGE) ×1 IMPLANT

## 2023-01-06 NOTE — Anesthesia Procedure Notes (Signed)
Procedure Name: LMA Insertion Date/Time: 01/06/2023 7:45 AM  Performed by: Alicia Amel, MDPre-anesthesia Checklist: Emergency Drugs available, Patient identified, Suction available and Patient being monitored Patient Re-evaluated:Patient Re-evaluated prior to induction Oxygen Delivery Method: Circle system utilized Preoxygenation: Pre-oxygenation with 100% oxygen Induction Type: IV induction LMA: LMA inserted LMA Size: 4.0 Number of attempts: 1 Placement Confirmation: positive ETCO2 and breath sounds checked- equal and bilateral Tube secured with: Tape Dental Injury: Teeth and Oropharynx as per pre-operative assessment

## 2023-01-06 NOTE — Anesthesia Preprocedure Evaluation (Signed)
Anesthesia Evaluation  Patient identified by MRN, date of birth, ID band Patient awake    Reviewed: Allergy & Precautions, H&P , NPO status , Patient's Chart, lab work & pertinent test results, reviewed documented beta blocker date and time   History of Anesthesia Complications (+) PONV and history of anesthetic complications  Airway Mallampati: II  TM Distance: >3 FB Neck ROM: full    Dental no notable dental hx.    Pulmonary neg pulmonary ROS   Pulmonary exam normal breath sounds clear to auscultation       Cardiovascular Exercise Tolerance: Good hypertension, negative cardio ROS  Rhythm:regular Rate:Normal     Neuro/Psych  PSYCHIATRIC DISORDERS  Depression    negative neurological ROS  negative psych ROS   GI/Hepatic negative GI ROS, Neg liver ROS,GERD  ,,  Endo/Other  negative endocrine ROS    Renal/GU negative Renal ROS  negative genitourinary   Musculoskeletal   Abdominal   Peds  Hematology negative hematology ROS (+) Blood dyscrasia, anemia   Anesthesia Other Findings   Reproductive/Obstetrics negative OB ROS                             Anesthesia Physical Anesthesia Plan  ASA: 2  Anesthesia Plan: General and General LMA   Post-op Pain Management:    Induction:   PONV Risk Score and Plan: Ondansetron  Airway Management Planned:   Additional Equipment:   Intra-op Plan:   Post-operative Plan:   Informed Consent: I have reviewed the patients History and Physical, chart, labs and discussed the procedure including the risks, benefits and alternatives for the proposed anesthesia with the patient or authorized representative who has indicated his/her understanding and acceptance.     Dental Advisory Given  Plan Discussed with: CRNA  Anesthesia Plan Comments:        Anesthesia Quick Evaluation

## 2023-01-06 NOTE — Interval H&P Note (Signed)
History and Physical Interval Note:  01/06/2023 7:27 AM  Joyce Roberts  has presented today for surgery, with the diagnosis of INTRADUCTAL PAPILLOMA LEFT BREAST.  The various methods of treatment have been discussed with the patient and family. After consideration of risks, benefits and other options for treatment, the patient has consented to  Procedure(s): BREAST BIOPSY WITH RADIO FREQUENCY LOCALIZER (Left) as a surgical intervention.  The patient's history has been reviewed, patient examined, no change in status, stable for surgery.  I have reviewed the patient's chart and labs.  Questions were answered to the patient's satisfaction.    Tags in place. Have looked at mammogram. Lucretia Roers

## 2023-01-06 NOTE — Transfer of Care (Signed)
Immediate Anesthesia Transfer of Care Note  Patient: Joyce Roberts  Procedure(s) Performed: BREAST BIOPSY WITH RADIO FREQUENCY LOCALIZER (Left: Breast)  Patient Location: PACU  Anesthesia Type:General  Level of Consciousness: drowsy  Airway & Oxygen Therapy: Patient Spontanous Breathing  Post-op Assessment: Report given to RN and Post -op Vital signs reviewed and stable  Post vital signs: Reviewed and stable  Last Vitals:  Vitals Value Taken Time  BP 129/81 01/06/23 0946  Temp 97.4   Pulse 73 01/06/23 0949  Resp 14 01/06/23 0949  SpO2 99 % 01/06/23 0949  Vitals shown include unfiled device data.  Last Pain:  Vitals:   01/06/23 0659  TempSrc: Oral  PainSc: 0-No pain         Complications: No notable events documented.

## 2023-01-06 NOTE — Progress Notes (Signed)
[facility]  Updated daughter. Tags X 2 and clips X 3 removed.   Will see patient in office for follow up.  Rx sent to pharmacy.   Algis Greenhouse, MD [facility] [address] [place], [place] [address] [phone] (office)

## 2023-01-06 NOTE — Op Note (Signed)
[facility] Operative Note  01/06/23  Preoperative Diagnosis: Left breast intraductal papilloma    Postoperative Diagnosis: Same   Procedure(s) Performed: Excisional breast biopsy after radiofrequency tags placed    Surgeon: Leatrice Jewels. Henreitta Leber, MD   Assistants: No qualified resident was available    Anesthesia: General endotracheal   Anesthesiologist: Dr. Johnnette Litter   Specimens: Larger specimen (more lateral clips and tag, 1 figure of 8 suture on the specimen where tag was migrating in tract), smaller specimen (medial clip and tag); painted specimens    Estimated Blood Loss: Minimal   Blood Replacement: None    Complications: None   Wound Class: Clean   Operative Indications: [name] is a 52 yo who has a history of papilloma and breast discharge and I did an excision on in the past with removal of the tag but the biopsy clip could not be located. Since that excision she had some resolution of her discharge and subsequent mammograms showed the retained clip which likely migrated during the excision.  She then had to have additional biopsies with radiation and has been found to have a papilloma again in the left breast. To be safe I discussed with her and radiology, planning to remove all the clips and tags to ensure that we get the pathology in question.  We discussed excisional biopsy and risk of bleeding, infection, finding something unexpected, cosmetic changes to the breast.   Findings: Nodular breast tissue with scar, tract from the tag placement easily seen and tag noted to be moving in the tract, tract sutured to ensure it did not the tag dislodge; two specimens sent     Procedure: The patient was taken to the operating room and placed supine. General endotracheal anesthesia was induced. Intravenous antibiotics were  administered per protocol.  The left breast was prepped and draped in the usual sterile fashion.   Using the Hologic localizer, I identified  the two tags with separate serial numbers. I made an incision around the lateral nipple areolar complex and made it large enough to try to incorporate both tags in one specimen.  I started  trying to get around the more lateral inferior tag first, and successfully excised tissue around this with adequate margin. During this with traction on the specimen, we noted the tract from the tag placement and the tag moving in this tract. So that the tag did not dislodge, I placed a figure of 8 silk suture over the area of the tract.  We then proceeded to try to localize and excise the second tag with adequate margins. In the original larger specimen, I noted the tag in the medial superficial aspect using the localizer, but once I had the specimen out the tag was not in this larger specimen. I then was able to localize it the tissue just superior to the excision site in the medial superficial aspect of the nipple areolar complex. The second tag was identified here. I am assuming due to the proximity of the localizer to the tag, I was picking this up in the other specimen until I completely resected it.  I was able to carefully excise this superficial area by retracting with an allis and having the tech elevate the nipple areolar complex. I used scissors to carefully excise due to the proximity of the areolar tissue. This was removed with adequate margins intact in this smaller specimen, and the tag was located inside the specimen. Both were specimens larger and smaller were painted in the  standard fashion. They were sent to mammogram together and confirmation of 2 tags and 3 clips was obtained.    Hemostasis was achieved. The deepest aspect of the cavity I did close with some interrupted 3-0 Vicryl sutures but more superficially I left the area open to help with seroma formation for cosmesis. I then re-approximated the dermis with interrupted 3-0 Vicryls and closed the skin with 4-0 Monocryl subcuticular and dermabond.     Final inspection revealed acceptable hemostasis. All counts were correct at the end of the case. The patient was awakened from anesthesia and extubated without complication.  The patient went to the PACU in stable condition.   Joyce Greenhouse, MD [facility] [address] [place], [place] [address] [phone] (office)

## 2023-01-09 LAB — SURGICAL PATHOLOGY

## 2023-01-10 ENCOUNTER — Encounter (HOSPITAL_COMMUNITY): Payer: Self-pay | Admitting: General Surgery

## 2023-01-10 ENCOUNTER — Telehealth: Payer: Self-pay | Admitting: *Deleted

## 2023-01-10 NOTE — Telephone Encounter (Signed)
Surgical Date: 01/06/2023 Procedure: Excisional Breast Biopsy, Left  Received call from patient daughter, Shanda Bumps [phone] telephone.   Patient daughter reports area to the right of the nipple on the left breast noted red, warm to touch and slightly hard. States that patient denies fever or chills. Patient does endorse more pain in the left breast developing as time progresses.   Appointment scheduled for Thursday, 01/12/2023 for evaluation.   Patient daughter inquired if ABTx should be started prior to appointment.   Please advise.

## 2023-01-10 NOTE — Progress Notes (Signed)
Let her know pathology all benign. Intraductal papilloma (2.5 mm), completely excised. She had some other fibrocystic and chronic inflammatory changes. On Mammogram of the specimen the 2 tags and 3 clips were removed.

## 2023-01-10 NOTE — Telephone Encounter (Signed)
Will wait and see what I find. If she can send a photo I could tell earlier.

## 2023-01-11 NOTE — Telephone Encounter (Signed)
Late documentation for 01/10/2023:  Patient daughter returned call and made aware. Will attempt to send MyChart message with picture if she is able to set up account. Sent text message through [facility] with set up instructions.

## 2023-01-12 ENCOUNTER — Ambulatory Visit (INDEPENDENT_AMBULATORY_CARE_PROVIDER_SITE_OTHER): Payer: Self-pay | Admitting: General Surgery

## 2023-01-12 ENCOUNTER — Encounter: Payer: Self-pay | Admitting: General Surgery

## 2023-01-12 VITALS — BP 144/88 | HR 76 | Temp 98.1°F | Resp 12 | Ht 60.0 in | Wt 145.0 lb

## 2023-01-12 DIAGNOSIS — D369 Benign neoplasm, unspecified site: Secondary | ICD-10-CM

## 2023-01-12 MED ORDER — AMOXICILLIN-POT CLAVULANATE 875-125 MG PO TABS: 1.0000 | ORAL_TABLET | Freq: Two times a day (BID) | ORAL | 0 refills | Status: DC

## 2023-01-12 NOTE — Progress Notes (Signed)
[facility]  Patient called office and was having some redness around her nipple. She says it is hot and swollen. She is here today with an interpretor.   BP (!) 144/88   Pulse 76   Temp 98.1 F (36.7 C) (Oral)   Resp 12   Ht 5' (1.524 m)   Wt 145 lb (65.8 kg)   SpO2 97%   BMI 28.32 kg/m  Incision c/d/I without erythema or drainage, nipple areola more medial and inferior to the incision with some redness and tenderness   Patient s/p excisional breast biopsy and what could just be reactive changes from a hematoma but with the redness will be careful and give antibiotics.  Discussed if worsening she may need I&D next week.  Activity as tolerated.  Pathology all benign.  Take the antibiotic. Monitor for worsening pain or redness.  Call on Monday and let us know how it is doing.   Algis Greenhouse, MD [facility] [address] [place], [place] [address] [phone] (office)

## 2023-01-12 NOTE — Patient Instructions (Addendum)
Activity as tolerated.  Pathology all benign.  Take the antibiotic. Monitor for worsening pain or redness.  Call on Monday and let us know how it is doing.   Actividad tolerada.  Patologa toda benigna.  Toma el antibitico. Controle si el dolor o el enrojecimiento empeoran.  Llama el lunes y cuntanos cmo te va.

## 2023-01-13 NOTE — Anesthesia Postprocedure Evaluation (Signed)
Anesthesia Post Note  Patient: Joyce Roberts  Procedure(s) Performed: BREAST BIOPSY WITH RADIO FREQUENCY LOCALIZER (Left: Breast)  Patient location during evaluation: Phase II Anesthesia Type: General Level of consciousness: awake Pain management: pain level controlled Vital Signs Assessment: post-procedure vital signs reviewed and stable Respiratory status: spontaneous breathing and respiratory function stable Cardiovascular status: blood pressure returned to baseline and stable Postop Assessment: no headache and no apparent nausea or vomiting Anesthetic complications: no Comments: Late entry   No notable events documented.   Last Vitals:  Vitals:   01/06/23 1030 01/06/23 1049  BP: 136/77 110/81  Pulse: 66 64  Resp: 10 16  Temp:  36.6 C  SpO2: 99% 99%    Last Pain:  Vitals:   01/06/23 1049  TempSrc: Axillary  PainSc: 4                  Windell Norfolk

## 2023-01-16 ENCOUNTER — Telehealth: Payer: Self-pay | Admitting: *Deleted

## 2023-01-16 NOTE — Telephone Encounter (Signed)
Surgical Date: 01/06/2023 Procedure: Excisional Breast Biopsy, Left   Received call from patient daughter, Shanda Bumps [phone] telephone.   Reports that redness and pain in left breast has not improved. States that pain behind nipple area has worsened. States that redness and warmth to touch is worse as well.   Appointment scheduled for evaluation.

## 2023-01-17 ENCOUNTER — Encounter: Payer: Self-pay | Admitting: General Surgery

## 2023-01-17 ENCOUNTER — Ambulatory Visit: Payer: Self-pay

## 2023-01-17 ENCOUNTER — Other Ambulatory Visit: Payer: Self-pay | Admitting: Family Medicine

## 2023-01-17 ENCOUNTER — Ambulatory Visit (INDEPENDENT_AMBULATORY_CARE_PROVIDER_SITE_OTHER): Payer: Self-pay | Admitting: General Surgery

## 2023-01-17 VITALS — BP 141/89 | HR 75 | Temp 98.2°F | Resp 12 | Ht 60.0 in | Wt 144.0 lb

## 2023-01-17 DIAGNOSIS — N6489 Other specified disorders of breast: Secondary | ICD-10-CM

## 2023-01-17 MED ORDER — OXYCODONE HCL 5 MG PO TABS
5.0000 mg | ORAL_TABLET | ORAL | 0 refills | Status: DC | PRN
Start: 2023-01-17 — End: 2023-01-19

## 2023-01-17 MED ORDER — SULFAMETHOXAZOLE-TRIMETHOPRIM 800-160 MG PO TABS
1.0000 | ORAL_TABLET | Freq: Two times a day (BID) | ORAL | 0 refills | Status: AC
Start: 2023-01-17 — End: 2023-01-24

## 2023-01-17 NOTE — Progress Notes (Signed)
[facility] Procedure Note  01/17/23  Pre-procedure Diagnosis: Left breast redness, cellulitis    Post-procedure Diagnosis: Left breast redness, cellulitis, seroma    Procedure(s) Performed: Left breast seroma    Surgeon: Lillia Abed C. Henreitta Leber, MD   Assistants: No qualified resident was available    Anesthesia: Lidocaine 1%    Specimens: Seroma fluid sent for culture    Estimated Blood Loss: Minimal  Wound Class: Contaminated    Procedure Indications: [name] is a 52 yo who has redness at her incision site and around the areola.  The antibiotics have not helped. We discussed aspiration and risk of bleeding, infection, finding abscess or needing an incision. An interpretor was present.   Findings: sero-sanginous fluid evacuated and sent for culture    Procedure: The patient was taken to the procedure room and placed supine. The left breast was prepared and draped in the usual sterile fashion. Lidocaine 1% was injected. An 18 gauge needle and 50cc syringe was used to aspirate out serosanginous fluid. Approximately 80cc of fluid was evacuated. This was sent for culture. A bandage was placed over the aspiration site.     Final inspection revealed acceptable hemostasis. The patient tolerated the procedure well.   Instructions:  Leave dressing on for 48 hours. Ok to shower after that.  You can "bird bath" shower around the area until then. Take the new antibiotic. Tylenol and ibuprofen for pain.  Take the roxicodone if needed for breakthrough pain. You may have some drainage on the bandage and this is normal.  If it  gets saturated with fluid replace the bandage.    Algis Greenhouse, MD [facility] [address] [place], [place] [address] [phone] (office)   Future Appointments  Date Time Provider Department Center  01/17/2023  1:00 PM Rocco Pauls, [facility] None  01/19/2023  1:15 PM Lucretia Roers,  MD [facility] None  01/25/2023  2:30 PM Lucretia Roers, MD [facility] None  02/07/2023 10:30 AM Jacquelin Hawking, PA-C [facility] None

## 2023-01-17 NOTE — Patient Instructions (Addendum)
Leave dressing on for 48 hours. Ok to shower after that.  You can "bird bath" shower around the area until then. Take the new antibiotic. Tylenol and ibuprofen for pain.  Take the roxicodone if needed for breakthrough pain. You may have some drainage on the bandage and this is normal.  If it  gets saturated with fluid replace the bandage.   Dejar actuar el apsito durante 48 horas. Est bien ducharse despus de eso.  Puedes ducharte con un "bao para pjaros" alrededor del rea hasta entonces. Tome el nuevo antibitico. Tylenol e ibuprofeno para Chief Technology Officer.  Tome roxicodona si es necesario para Chief Technology Officer irruptivo. Es posible que tenga algo de drenaje en el vendaje y esto es normal.  Si se satura con lquido, reemplace el vendaje.

## 2023-01-17 NOTE — Addendum Note (Signed)
Addended by: Legrand Rams B on: 01/17/2023 03:35 PM   Modules accepted: Orders

## 2023-01-19 ENCOUNTER — Ambulatory Visit (INDEPENDENT_AMBULATORY_CARE_PROVIDER_SITE_OTHER): Payer: Self-pay | Admitting: General Surgery

## 2023-01-19 ENCOUNTER — Other Ambulatory Visit: Payer: Self-pay | Admitting: Physician Assistant

## 2023-01-19 VITALS — BP 135/85 | HR 70 | Temp 98.2°F | Resp 12 | Ht 60.0 in | Wt 143.0 lb

## 2023-01-19 DIAGNOSIS — N6489 Other specified disorders of breast: Secondary | ICD-10-CM

## 2023-01-19 DIAGNOSIS — I10 Essential (primary) hypertension: Secondary | ICD-10-CM

## 2023-01-19 DIAGNOSIS — E785 Hyperlipidemia, unspecified: Secondary | ICD-10-CM

## 2023-01-19 DIAGNOSIS — K760 Fatty (change of) liver, not elsewhere classified: Secondary | ICD-10-CM

## 2023-01-19 MED ORDER — TRAMADOL HCL 50 MG PO TABS
50.0000 mg | ORAL_TABLET | Freq: Four times a day (QID) | ORAL | 0 refills | Status: AC | PRN
Start: 2023-01-19 — End: ?

## 2023-01-19 NOTE — Progress Notes (Signed)
Looking this up Bactrim DS covers this infection. She was improving today.

## 2023-01-19 NOTE — Progress Notes (Signed)
[facility]  Less painful but still tender/ hypersensitive  BP 135/85   Pulse 70   Temp 98.2 F (36.8 C) (Oral)   Resp 12   Ht 5' (1.524 m)   Wt 143 lb (64.9 kg)   SpO2 98%   BMI 27.93 kg/m  Aspiration site healed over, no drainage, less cellulitis Less tender, some seroma re-accumulation   Patient s/p seroma drainage after excisional biopsy from the left breast.   Complete the antibiotic. If you are getting dizzy with the antibiotic, I am not sure why.  Try to sit and relax after taking it to be safe.  Will see you next week.   Future Appointments  Date Time Provider Department Center  01/24/2023 11:30 AM Rocco Pauls, [facility] None  01/25/2023  2:30 PM Lucretia Roers, MD [facility] None  02/07/2023 10:30 AM Jacquelin Hawking, PA-C [facility] None   Algis Greenhouse, MD [facility] [address] [place], [place] [address] [phone] (office)

## 2023-01-19 NOTE — Patient Instructions (Addendum)
Complete the antibiotic. If you are getting dizzy with the antibiotic, I am not sure why.  Try to sit and relax after taking it to be safe.  Will see you next week.   Completa el antibitico. Si te mareas con el antibitico, no estoy seguro de por qu.  Intente sentarse y relajarse despus de tomarlo para estar seguro.  Nos vemos la prxima semana.

## 2023-01-20 LAB — WOUND CULTURE
MICRO NUMBER:: 15631074
SPECIMEN QUALITY:: ADEQUATE

## 2023-01-20 NOTE — Progress Notes (Signed)
 Bactrim is sensitive

## 2023-01-24 ENCOUNTER — Ambulatory Visit: Payer: Self-pay

## 2023-01-24 DIAGNOSIS — F419 Anxiety disorder, unspecified: Secondary | ICD-10-CM

## 2023-01-25 ENCOUNTER — Ambulatory Visit (INDEPENDENT_AMBULATORY_CARE_PROVIDER_SITE_OTHER): Payer: Self-pay | Admitting: General Surgery

## 2023-01-25 VITALS — BP 137/87 | HR 83 | Temp 98.0°F | Resp 12 | Ht 60.0 in | Wt 144.0 lb

## 2023-01-25 DIAGNOSIS — M792 Neuralgia and neuritis, unspecified: Secondary | ICD-10-CM

## 2023-01-25 DIAGNOSIS — N6489 Other specified disorders of breast: Secondary | ICD-10-CM

## 2023-01-25 MED ORDER — SULFAMETHOXAZOLE-TRIMETHOPRIM 800-160 MG PO TABS
1.0000 | ORAL_TABLET | Freq: Two times a day (BID) | ORAL | 0 refills | Status: AC
Start: 2023-01-25 — End: 2023-02-01

## 2023-01-25 MED ORDER — GABAPENTIN 100 MG PO CAPS
100.0000 mg | ORAL_CAPSULE | Freq: Three times a day (TID) | ORAL | 0 refills | Status: DC
Start: 2023-01-25 — End: 2023-12-13

## 2023-01-25 NOTE — Progress Notes (Signed)
[facility]  Doing well. Still with soreness in the left breast.   Has finished the antibiotic.  BP 137/87   Pulse 83   Temp 98 F (36.7 C) (Oral)   Resp 12   Ht 5' (1.524 m)   Wt 144 lb (65.3 kg)   SpO2 95%   BMI 28.12 kg/m  Some fluctuant fluid Tender Aspiration done sterile, 40cc of brownish red clear fluid removed  Patient s/p breast excision and seroma drainage. More seroma drained today. Microbiology with organism and antibiotics sensitive to the organism.  I have resent the fluid for culture  Keep bandage on for 48 hours. Take the antibiotic. Take the gabapentin to help with the hypersensitivity pain issues.    Future Appointments  Date Time Provider Department Center  01/31/2023 10:15 AM Lucretia Roers, MD [facility] None  02/07/2023 10:30 AM Jacquelin Hawking, PA-C [facility] None  02/07/2023 11:00 AM Rocco Pauls, [facility] None   Algis Greenhouse, MD [facility] [address] [place], [place] [address] [phone] (office)

## 2023-01-25 NOTE — Patient Instructions (Addendum)
Keep bandage on for 48 hours. Take the antibiotic. Take the gabapentin to help with the hypersensitivity pain issues.   Mantenga el vendaje puesto durante 48 horas. Toma el antibitico. Tome gabapentina para ayudar con los problemas de dolor por hipersensibilidad

## 2023-01-26 ENCOUNTER — Other Ambulatory Visit: Payer: Self-pay | Admitting: Family Medicine

## 2023-01-26 DIAGNOSIS — N6489 Other specified disorders of breast: Secondary | ICD-10-CM

## 2023-01-30 LAB — WOUND CULTURE
MICRO NUMBER:: 15670867
RESULT:: NO GROWTH
SPECIMEN QUALITY:: ADEQUATE

## 2023-01-31 ENCOUNTER — Ambulatory Visit (INDEPENDENT_AMBULATORY_CARE_PROVIDER_SITE_OTHER): Payer: Self-pay | Admitting: General Surgery

## 2023-01-31 ENCOUNTER — Encounter: Payer: Self-pay | Admitting: General Surgery

## 2023-01-31 VITALS — BP 143/89 | HR 71 | Temp 98.3°F | Resp 14 | Ht 60.0 in | Wt 145.0 lb

## 2023-01-31 DIAGNOSIS — N6489 Other specified disorders of breast: Secondary | ICD-10-CM

## 2023-01-31 DIAGNOSIS — M792 Neuralgia and neuritis, unspecified: Secondary | ICD-10-CM

## 2023-01-31 NOTE — Patient Instructions (Addendum)
Monitor area. Can increase gabapentin to 200mg  three times a day if you feel like the pain is not improving.   rea de vigilancia. Puede aumentar la gabapentina a 200 mg tres veces al da si siente que el dolor no mejora.

## 2023-01-31 NOTE — Progress Notes (Signed)
This is session # 4 with Joyce Roberts for individual [facility] services for anxiety and depression. Patient presents alone with Spanish interpreter. BHP spent 55 minutes with patient.    Patient reports anxious symptoms including intrusive worries and autonomic hyperactivity and depressive symptoms including tiredness, lack of motivation, hopelessness, irritability, decreased interest, and sadness. Symptoms are related to chronic pain, poor sleep and eating patterns, recent health concerns, and grief/trauma hx. Patient will likely benefit from individual support services to aid coping, processing, and strengths-based perspective with referral to Spanish-speaking provider for specialized trauma counseling.   This procedure has been fully reviewed with the patient and informed consent has been obtained.   Patient location: Pt seen in clinic via telehealth.   I connected with Joyce Roberts on 01/24/23 by a video enabled telemedicine application and verified that I am speaking with the correct person.   I discussed the limitations of evaluation and management by telemedicine. The patient expressed understanding and agreed to proceed.     ASSESSMENT AND PLAN   Current diagnosis: anxiety and depression   We created the following treatment plan at today's appointment:               1)  01/24/23               2)  Decrease anxious and depressive symptoms through increased pain management, support, processing, and coping and self-care skills               3)  Medication management with Provider. On 12/06/22, pt reports a felt improvement in depressive symptoms overall attributed to medication.               4)  Next screening due in 3 sessions.     HISTORY OF PRESENT ILLNESS   Mental Status: Joyce Roberts presented oriented X4. Mood and affect were anxious and depressed. Judgment was appropriate. Memory was appropriate. Thought processes and content were  appropriate. Speech was appropriate. Patient denies SI/HI.   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   Narrative: Met with pt for scheduled appointment. Person-centered approach used to continue therapeutic alliance. Pt provides updates since previous visit including surgery impacting mood negatively. Reminded pt to contact referral when she is ready. Processed recent surgery experience and normalized the needto slow and heal. Identified continued resources such as her grandson and added distress tolerance skills from DBT to aid coping through follow-up appointment. Attempted mindfulness activity using the 5 senses but pt noted headache that made it difficult to focus, and instead switched to butterfly tapping and counting which felt more accessible. Assessed for other concerns pt wished to address today, which they denied. Scheduled follow up.   Effectiveness of the intervention(s) and the beneficiary's response or progress toward goal(s):  Patient is in a contemplative stage of change to engage in treatment plan.

## 2023-01-31 NOTE — Progress Notes (Signed)
[facility]  Doing fair. Breast felt much better after the drainage but gets worse after. Last culture without growth.  BP (!) 143/89   Pulse 71   Temp 98.3 F (36.8 C) (Oral)   Resp 14   Ht 5' (1.524 m)   Wt 145 lb (65.8 kg)   SpO2 97%   BMI 28.32 kg/m  Soft, no erythema, incision healing on left breast  Patient s/p excisional biopsy left breast. Seroma/ hematoma evacuated. Doing well.  But still with pain. Breast overall feels better.   Monitor area. Can increase gabapentin to 200mg  three times a day if you feel like the pain is not improving.   Future Appointments  Date Time Provider Department Center  02/07/2023 10:30 AM Jacquelin Hawking, PA-C [facility] None  02/07/2023 11:00 AM Rocco Pauls, [facility] None  02/08/2023  3:15 PM Lucretia Roers, MD [facility] None   Algis Greenhouse, MD [facility] [address] [place], [place] [address] [phone] (office)

## 2023-02-07 ENCOUNTER — Ambulatory Visit: Payer: Self-pay

## 2023-02-07 ENCOUNTER — Encounter: Payer: Self-pay | Admitting: Physician Assistant

## 2023-02-07 ENCOUNTER — Other Ambulatory Visit (HOSPITAL_COMMUNITY)
Admission: RE | Admit: 2023-02-07 | Discharge: 2023-02-07 | Disposition: A | Discharge status: Home / Self Care | Payer: Self-pay | Source: Ambulatory Visit | Attending: Physician Assistant | Admitting: Physician Assistant

## 2023-02-07 ENCOUNTER — Ambulatory Visit: Payer: Self-pay | Admitting: Physician Assistant

## 2023-02-07 VITALS — BP 142/94 | HR 82 | Temp 98.1°F | Ht 60.0 in | Wt 145.2 lb

## 2023-02-07 DIAGNOSIS — I10 Essential (primary) hypertension: Secondary | ICD-10-CM

## 2023-02-07 DIAGNOSIS — F32A Depression, unspecified: Secondary | ICD-10-CM

## 2023-02-07 DIAGNOSIS — E785 Hyperlipidemia, unspecified: Secondary | ICD-10-CM

## 2023-02-07 DIAGNOSIS — F419 Anxiety disorder, unspecified: Secondary | ICD-10-CM

## 2023-02-07 DIAGNOSIS — R413 Other amnesia: Secondary | ICD-10-CM | POA: Insufficient documentation

## 2023-02-07 DIAGNOSIS — Z789 Other specified health status: Secondary | ICD-10-CM

## 2023-02-07 DIAGNOSIS — Z23 Encounter for immunization: Secondary | ICD-10-CM

## 2023-02-07 LAB — TSH: TSH: 2.074 u[IU]/mL (ref 0.350–4.500)

## 2023-02-07 LAB — COMPREHENSIVE METABOLIC PANEL
ALT: 18 U/L (ref 0–44)
AST: 20 U/L (ref 15–41)
Albumin: 4.7 g/dL (ref 3.5–5.0)
Alkaline Phosphatase: 86 U/L (ref 38–126)
Anion gap: 10 (ref 5–15)
BUN: 15 mg/dL (ref 6–20)
CO2: 25 mmol/L (ref 22–32)
Calcium: 9 mg/dL (ref 8.9–10.3)
Chloride: 103 mmol/L (ref 98–111)
Creatinine, Ser: 0.76 mg/dL (ref 0.44–1.00)
GFR, Estimated: >60 mL/min (ref >60)
Glucose, Bld: 96 mg/dL (ref 70–99)
Potassium: 4.5 mmol/L (ref 3.5–5.1)
Sodium: 138 mmol/L (ref 135–145)
Total Bilirubin: 0.5 mg/dL (ref <1.2)
Total Protein: 8.5 g/dL — ABNORMAL HIGH (ref 6.5–8.1)

## 2023-02-07 LAB — LIPID PANEL
Cholesterol: 241 mg/dL — ABNORMAL HIGH (ref 0–200)
HDL: 56 mg/dL (ref >40)
LDL Cholesterol: 148 mg/dL — ABNORMAL HIGH (ref 0–99)
Total CHOL/HDL Ratio: 4.3 {ratio}
Triglycerides: 183 mg/dL — ABNORMAL HIGH (ref <150)
VLDL: 37 mg/dL (ref 0–40)

## 2023-02-07 MED ORDER — CITALOPRAM HYDROBROMIDE 20 MG PO TABS: 20.0000 mg | ORAL_TABLET | Freq: Every day | ORAL | 0 refills | Status: DC

## 2023-02-07 MED ORDER — AMLODIPINE BESYLATE 5 MG PO TABS: 5.0000 mg | ORAL_TABLET | Freq: Every day | ORAL | 0 refills | Status: DC

## 2023-02-07 MED ORDER — ATORVASTATIN CALCIUM 20 MG PO TABS: 20.0000 mg | ORAL_TABLET | Freq: Every day | ORAL | 0 refills | Status: DC

## 2023-02-07 MED ORDER — OMEPRAZOLE 20 MG PO CPDR: DELAYED_RELEASE_CAPSULE | ORAL | 0 refills | Status: DC

## 2023-02-07 MED ORDER — LOSARTAN POTASSIUM 100 MG PO TABS: 100.0000 mg | ORAL_TABLET | Freq: Every day | ORAL | 1 refills | Status: DC
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
## 2023-02-08 ENCOUNTER — Ambulatory Visit (INDEPENDENT_AMBULATORY_CARE_PROVIDER_SITE_OTHER): Payer: Self-pay | Admitting: General Surgery

## 2023-02-08 ENCOUNTER — Other Ambulatory Visit: Payer: Self-pay | Admitting: Physician Assistant

## 2023-02-08 VITALS — BP 144/87 | HR 77 | Temp 98.1°F | Resp 12 | Ht 60.0 in | Wt 146.0 lb

## 2023-02-08 DIAGNOSIS — N6489 Other specified disorders of breast: Secondary | ICD-10-CM

## 2023-02-08 DIAGNOSIS — M792 Neuralgia and neuritis, unspecified: Secondary | ICD-10-CM

## 2023-02-08 DIAGNOSIS — R413 Other amnesia: Secondary | ICD-10-CM

## 2023-02-08 LAB — VITAMIN B12: Vitamin B-12: 324 pg/mL (ref 180–914)

## 2023-02-08 LAB — FOLATE: Folate: 18.5 ng/mL (ref >5.9)

## 2023-02-08 NOTE — Patient Instructions (Addendum)
Inflammation will improve in 6-8 weeks. Continue on the gabapentin 100 mg three times a day for now.  Call with worsening swelling, redness, fevers.   La inflamacin mejorar en 6 a 8 semanas. Contine con gabapentina 100 mg tres veces al [employer].  Llame si empeora la hinchazn, enrojecimiento y fiebre.

## 2023-02-08 NOTE — Progress Notes (Signed)
[facility]  Doing better. Still sore.   BP (!) 144/87   Pulse 77   Temp 98.1 F (36.7 C) (Oral)   Resp 12   Ht 5' (1.524 m)   Wt 146 lb (66.2 kg)   SpO2 98%   BMI 28.51 kg/m  Indurated but no drainage or redness  Patient s/p left breast lumpectomy and seroma. Doing better.   Induration/ Inflammation will improve in 6-8 weeks. Continue on the gabapentin 100 mg three times a day for now.  Call with worsening swelling, redness, fevers.   Future Appointments  Date Time Provider Department Center  02/14/2023  2:00 PM Rocco Pauls, [facility] None  03/07/2023 11:00 AM Jacquelin Hawking, PA-C [facility] None  03/16/2023  2:45 PM Henreitta Leber Leatrice Jewels, MD [facility] None   Algis Greenhouse, MD [facility] [address] [place], [place] [address] [phone] (office)

## 2023-02-14 ENCOUNTER — Ambulatory Visit: Payer: Self-pay

## 2023-02-21 ENCOUNTER — Ambulatory Visit: Payer: Self-pay

## 2023-02-21 DIAGNOSIS — F32A Depression, unspecified: Secondary | ICD-10-CM

## 2023-02-21 NOTE — Progress Notes (Signed)
This is session # 5 with [name] for individual [facility] services for anxiety and depression. Patient presents alone with Spanish interpreter. BHP spent 50 minutes with patient.    Patient reports anxious symptoms including intrusive worries and autonomic hyperactivity and depressive symptoms including tiredness, lack of motivation, hopelessness, irritability, decreased interest, and sadness. Symptoms are related to chronic pain, poor sleep and eating patterns, recent health concerns, and grief/trauma hx. Patient will likely benefit from individual support services to aid coping, processing, and strengths-based perspective with referral to Spanish-speaking provider for specialized trauma counseling.   This procedure has been fully reviewed with the patient and informed consent has been obtained.   Patient location: Pt seen in clinic via telehealth.   I connected with [name] on 02/21/23 by a video enabled telemedicine application and verified that I am speaking with the correct person.   I discussed the limitations of evaluation and management by telemedicine. The patient expressed understanding and agreed to proceed.     ASSESSMENT AND PLAN   Current diagnosis: anxiety and depression   We created the following treatment plan at today's appointment:               1)  02/21/23               2)  Decrease anxious and depressive symptoms through increased pain management, support, processing, and coping and self-care skills               3)  Medication management with Provider. On 12/06/22, pt reports a felt improvement in depressive symptoms overall attributed to medication.               4)  Next screening due in 2 sessions.     HISTORY OF PRESENT ILLNESS   Mental Status: Nikoletta presented oriented X4. Mood and affect were anxious and depressed. Judgment was appropriate. Memory was appropriate. Thought processes and content were  appropriate. Speech was appropriate. Patient denies SI/HI.    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   Narrative: Met with pt for scheduled appointment. Person-centered approach used to continue therapeutic alliance. Pt provides updates since previous visit including healing from surgery and sadness around approaching the holidays. Pt describes successful use of stress reduction strategies (including counting, scanning for colors, and positive imagery) discussed last visit for her surgery follow-ups. Explored increased sadness tied to grief around the holidays and feelings of responsibility. Normalized experience and discussed ways to care without taking on responsibility through boundaries and mantras. Psychoeducation provided around grief and pt decides to try allowing herself to feel her grief for incorporating resources like journaling and prayer in preparation for the holidays. Pt also revisits feeling low energy and would like to explore next time whether it is connected to grief, depression, or other physical concerns. Assessed for other concerns pt wished to address today, which they denied. Scheduled follow up.   Effectiveness of the intervention(s) and the beneficiary's response or progress toward goal(s):  Patient is in a planning stage of change to engage in treatment plan.

## 2023-03-07 ENCOUNTER — Ambulatory Visit: Payer: Self-pay | Admitting: Physician Assistant

## 2023-03-07 DIAGNOSIS — J069 Acute upper respiratory infection, unspecified: Secondary | ICD-10-CM

## 2023-03-07 DIAGNOSIS — Z789 Other specified health status: Secondary | ICD-10-CM

## 2023-03-07 MED ORDER — PROMETHAZINE-DM 6.25-15 MG/5ML PO SYRP: ORAL_SOLUTION | ORAL | 0 refills | Status: DC

## 2023-03-07 NOTE — Progress Notes (Signed)
There were no vitals taken for this visit.   Subjective:    Patient ID: Joyce Roberts, female    DOB: 02-Jan-1971, 52 y.o.   MRN: [id number]  HPI: Joyce Roberts is a 52 y.o. female presenting on 03/07/2023 for No chief complaint on file.   HPI  I connected with  Joyce Roberts on 03/07/23 by a video enabled telemedicine application and verified that I am speaking with the correct person using two identifiers.   I discussed the limitations of evaluation and management by telemedicine. The patient expressed understanding and agreed to proceed.   This appointment is via Telephone [place] enough signal to do a video appointment  Pt says she has been Sick started Saturday + cough ST EA HA T 101 this moring Home covid test yesterday negative Her grandson (2yo)  has bronchitis-  he lives in same home.   She has had No emesis or diarrhea Some post tussive emesis She is Taking apap or ibu prn  She is allergic to guaifenesin She doesn't smoke Her last covid vax 02/07/23 and she got flu shot this year.       Relevant past medical, surgical, family and social history reviewed and updated as indicated. Interim medical history since our last visit reviewed. Allergies and medications reviewed and updated.   Current Outpatient Medications:    acetaminophen (TYLENOL) 500 MG tablet, Take 1,000 mg by mouth as needed., Disp: , Rfl:    amLODipine (NORVASC) 5 MG tablet, Take 1 tablet (5 mg total) by mouth daily. Tome una tableta por boca diaria, Disp: 90 tablet, Rfl: 0   atorvastatin (LIPITOR) 20 MG tablet, Take 1 tablet (20 mg total) by mouth daily., Disp: 90 tablet, Rfl: 0   cetirizine (ZYRTEC) 10 MG tablet, Take 1 tablet (10 mg total) by mouth daily as needed for allergies. Tome una tableta por boca diaria cuando sea necesario para las [place], Disp: 90 tablet, Rfl: 0   citalopram (CELEXA) 20 MG tablet, Take 1 tablet (20 mg  total) by mouth daily., Disp: 90 tablet, Rfl: 0   gabapentin (NEURONTIN) 100 MG capsule, Take 1 capsule (100 mg total) by mouth 3 (three) times daily., Disp: 90 capsule, Rfl: 0   ibuprofen (ADVIL) 200 MG tablet, Take 200 mg by mouth daily as needed., Disp: , Rfl:    losartan (COZAAR) 100 MG tablet, Take 1 tablet (100 mg total) by mouth daily. Tome una tableta por boca diaria, Disp: 90 tablet, Rfl: 1   omeprazole (PRILOSEC) 20 MG capsule, 1 PO BID. 1 POR BOCA DOS VECES AL DIA, Disp: 180 capsule, Rfl: 0   promethazine (PHENERGAN) 12.5 MG tablet, Take 2 tablets (25 mg total) by mouth every 6 (six) hours as needed for nausea or vomiting. (Patient not taking: Reported on 02/07/2023), Disp: 20 tablet, Rfl: 0   traMADol (ULTRAM) 50 MG tablet, Take 1 tablet (50 mg total) by mouth every 6 (six) hours as needed for severe pain (pain score 7-10). (Patient not taking: Reported on 02/07/2023), Disp: 15 tablet, Rfl: 0   Review of Systems  Per HPI unless specifically indicated above     Objective:    There were no vitals taken for this visit.  Wt Readings from Last 3 Encounters:  02/08/23 146 lb (66.2 kg)  02/07/23 145 lb 4 oz (65.9 kg)  01/31/23 145 lb (65.8 kg)    Physical Exam Pulmonary:  Effort: No respiratory distress.     Comments: Pt is talking in complete sentences without dyspnea Neurological:     Mental Status: She is alert and oriented to person, place, and time.  Psychiatric:        Behavior: Behavior is cooperative.           Assessment & Plan:   Encounter Diagnoses  Name Primary?   Upper respiratory tract infection, unspecified type Yes   Not proficient in English language       -rx Cough med without guifenesin to [employer] -pt to rest, drnk plenty fluids, ibu or apap prn, gargle with warm salt water q 2 hour while awake -she is to contact office for new symptoms or if persists

## 2023-03-14 ENCOUNTER — Ambulatory Visit: Payer: Self-pay

## 2023-03-16 ENCOUNTER — Encounter: Payer: Self-pay | Admitting: General Surgery

## 2023-03-30 ENCOUNTER — Encounter: Payer: Self-pay | Admitting: General Surgery

## 2023-04-04 ENCOUNTER — Ambulatory Visit: Payer: Self-pay | Admitting: Physician Assistant

## 2023-04-04 ENCOUNTER — Ambulatory Visit: Payer: Self-pay

## 2023-04-04 ENCOUNTER — Encounter: Payer: Self-pay | Admitting: Physician Assistant

## 2023-04-04 DIAGNOSIS — Z789 Other specified health status: Secondary | ICD-10-CM

## 2023-04-04 DIAGNOSIS — J069 Acute upper respiratory infection, unspecified: Secondary | ICD-10-CM

## 2023-04-04 MED ORDER — AMOXICILLIN 500 MG PO CAPS: 500.0000 mg | ORAL_CAPSULE | Freq: Three times a day (TID) | ORAL | 0 refills | Status: AC

## 2023-04-04 NOTE — Progress Notes (Signed)
   There were no vitals taken for this visit.   Subjective:    Patient ID: Joyce Roberts, female    DOB: 10-04-1970, 53 y.o.   MRN: [id number]  HPI: [name] is a 53 y.o. female presenting on 04/04/2023 for No chief complaint on file.   HPI   This is a telemedicine appointment.  It is via telephone as pt is unable to get good wifi signal where she lives.    I connected with  Joyce Roberts on 04/04/23 by a video enabled telemedicine application and verified that I am speaking with the correct person using two identifiers.   I discussed the limitations of evaluation and management by telemedicine. The patient expressed understanding and agreed to proceed.  Pt is at home.  Provider and translator are at office.  Pt was seen for illness in December and she says she is Still sick- she got a bit better but then worse again.  She Still has phlegm, a little cough, HA, conegestion and face hurts.  She has No emesis or diarrhea.   Her voice is hoarse.  She has No fevers.  She does have A little EA and ST.   She has No body aches.  She is using APAP and IBU for her symptoms.       Relevant past medical, surgical, family and social history reviewed and updated as indicated. Interim medical history since our last visit reviewed. Allergies and medications reviewed and updated.  Review of Systems  Per HPI unless specifically indicated above     Objective:    There were no vitals taken for this visit.  Wt Readings from Last 3 Encounters:  02/08/23 146 lb (66.2 kg)  02/07/23 145 lb 4 oz (65.9 kg)  01/31/23 145 lb (65.8 kg)    Physical Exam Pulmonary:     Effort: Pulmonary effort is normal. No respiratory distress.     Comments: Pt is talking in complete sentences without dyspnea Neurological:     Mental Status: She is alert and oriented to person, place, and time.           Assessment & Plan:    Encounter Diagnoses  Name Primary?   Upper respiratory tract  infection, unspecified type Yes   Not proficient in English language      Rx amoxil .  Pt is to contact office if she does not resolve.

## 2023-04-12 ENCOUNTER — Encounter: Payer: Self-pay | Admitting: General Surgery

## 2023-04-12 ENCOUNTER — Ambulatory Visit (INDEPENDENT_AMBULATORY_CARE_PROVIDER_SITE_OTHER): Payer: Self-pay | Admitting: General Surgery

## 2023-04-12 VITALS — BP 151/92 | HR 72 | Temp 98.4°F | Resp 12 | Ht 60.0 in | Wt 151.0 lb

## 2023-04-12 DIAGNOSIS — N6489 Other specified disorders of breast: Secondary | ICD-10-CM

## 2023-04-12 NOTE — Progress Notes (Signed)
 [facility]  Doing better. No on gabapentin . Improved pain. Used interpretor for the discussion.  BP (!) 151/92   Pulse 72   Temp 98.4 F (36.9 C) (Oral)   Resp 12   Ht 5' (1.524 m)   Wt 151 lb (68.5 kg)   SpO2 97%   BMI 29.49 kg/m  Left breast soft, old glue removed, nipple indented with scarring in after retroareolar excision  Patient s/p excisional biopsy, seroma and neuropathic pain resolved.  Follow up as needed. Get regular mammograms.   Deena Farrier, MD [facility] [address] [place], [place] [address] [phone] (office)

## 2023-04-12 NOTE — Patient Instructions (Signed)
 Follow up as needed. Get regular mammograms.   Haga un seguimiento segn sea necesario. Hgase mamografas peridicas.

## 2023-04-18 ENCOUNTER — Ambulatory Visit: Payer: Self-pay

## 2023-04-18 ENCOUNTER — Other Ambulatory Visit: Payer: Self-pay | Admitting: Physician Assistant

## 2023-04-18 DIAGNOSIS — E785 Hyperlipidemia, unspecified: Secondary | ICD-10-CM

## 2023-04-18 DIAGNOSIS — I10 Essential (primary) hypertension: Secondary | ICD-10-CM

## 2023-04-24 ENCOUNTER — Encounter: Payer: Self-pay | Admitting: Physician Assistant

## 2023-04-24 ENCOUNTER — Ambulatory Visit: Payer: Self-pay | Admitting: Physician Assistant

## 2023-04-24 DIAGNOSIS — Z789 Other specified health status: Secondary | ICD-10-CM

## 2023-04-24 DIAGNOSIS — R6889 Other general symptoms and signs: Secondary | ICD-10-CM

## 2023-04-24 MED ORDER — PROMETHAZINE-DM 6.25-15 MG/5ML PO SYRP: ORAL_SOLUTION | ORAL | 0 refills | Status: DC

## 2023-04-24 MED ORDER — OSELTAMIVIR PHOSPHATE 75 MG PO CAPS: 75.0000 mg | ORAL_CAPSULE | Freq: Two times a day (BID) | ORAL | 0 refills | Status: AC

## 2023-04-24 NOTE — Progress Notes (Signed)
   There were no vitals taken for this visit.   Subjective:    Patient ID: Joyce Roberts, female    DOB: 11-Dec-1970, 53 y.o.   MRN: [id number]  HPI: Joyce Roberts is a 53 y.o. female presenting on 04/24/2023 for No chief complaint on file.   HPI   This is a telemedicine appointment via telephone (pt has poor signal for video appointment)  I connected with  Joyce Roberts on 04/24/23 by a video enabled telemedicine application and verified that I am speaking with the correct person using two identifiers.   I discussed the limitations of evaluation and management by telemedicine. The patient expressed understanding and agreed to proceed.  Pt is at home.  Provider and translator are at office.   Pt s;ays Both her grandkids diagnosed with flu; one of them has been hospitalized.    Pt Started feeling bad yesterday.  She has HA chills subjective fever some dry cough and body aches.  She had Emesis this morning x 1.  Nausea continues.  No diarrhea.      Relevant past medical, surgical, family and social history reviewed and updated as indicated. Interim medical history since our last visit reviewed. Allergies and medications reviewed and updated.  Review of Systems  Per HPI unless specifically indicated above     Objective:    There were no vitals taken for this visit.  Wt Readings from Last 3 Encounters:  04/12/23 151 lb (68.5 kg)  02/08/23 146 lb (66.2 kg)  02/07/23 145 lb 4 oz (65.9 kg)    Physical Exam Pulmonary:     Effort: Pulmonary effort is normal. No respiratory distress.     Comments: Pt is talking in complete sentences wtihout dyspnea Neurological:     Mental Status: She is alert and oriented to person, place, and time.           Assessment & Plan:    Encounter Diagnoses  Name Primary?   Flu-like symptoms Yes   Not proficient in English language      -Rx tamiflu and promethazine DM.  Pt cautioned on sedation possible with promethazine  dm -counseled on Rest, fluids, OTCs to help symptoms -she is to contact office for worsening or new symptoms

## 2023-05-01 ENCOUNTER — Ambulatory Visit: Payer: Self-pay | Admitting: Physician Assistant

## 2023-05-05 ENCOUNTER — Other Ambulatory Visit (HOSPITAL_COMMUNITY)
Admission: RE | Admit: 2023-05-05 | Discharge: 2023-05-05 | Disposition: A | Discharge status: Home / Self Care | Payer: Self-pay | Source: Ambulatory Visit | Attending: Physician Assistant | Admitting: Physician Assistant

## 2023-05-05 DIAGNOSIS — E785 Hyperlipidemia, unspecified: Secondary | ICD-10-CM | POA: Insufficient documentation

## 2023-05-05 DIAGNOSIS — I10 Essential (primary) hypertension: Secondary | ICD-10-CM | POA: Insufficient documentation

## 2023-05-05 LAB — LIPID PANEL
Cholesterol: 198 mg/dL (ref 0–200)
HDL: 46 mg/dL (ref >40)
LDL Cholesterol: 111 mg/dL — ABNORMAL HIGH (ref 0–99)
Total CHOL/HDL Ratio: 4.3 {ratio}
Triglycerides: 204 mg/dL — ABNORMAL HIGH (ref <150)
VLDL: 41 mg/dL — ABNORMAL HIGH (ref 0–40)

## 2023-05-05 LAB — COMPREHENSIVE METABOLIC PANEL
ALT: 21 U/L (ref 0–44)
AST: 19 U/L (ref 15–41)
Albumin: 4.5 g/dL (ref 3.5–5.0)
Alkaline Phosphatase: 77 U/L (ref 38–126)
Anion gap: 10 (ref 5–15)
BUN: 13 mg/dL (ref 6–20)
CO2: 26 mmol/L (ref 22–32)
Calcium: 9.2 mg/dL (ref 8.9–10.3)
Chloride: 103 mmol/L (ref 98–111)
Creatinine, Ser: 0.8 mg/dL (ref 0.44–1.00)
GFR, Estimated: >60 mL/min (ref >60)
Glucose, Bld: 97 mg/dL (ref 70–99)
Potassium: 4.2 mmol/L (ref 3.5–5.1)
Sodium: 139 mmol/L (ref 135–145)
Total Bilirubin: 0.6 mg/dL (ref 0.0–1.2)
Total Protein: 7.8 g/dL (ref 6.5–8.1)

## 2023-05-09 ENCOUNTER — Ambulatory Visit: Payer: Self-pay

## 2023-05-09 ENCOUNTER — Ambulatory Visit: Payer: Self-pay | Admitting: Physician Assistant

## 2023-05-09 ENCOUNTER — Encounter: Payer: Self-pay | Admitting: Physician Assistant

## 2023-05-09 VITALS — BP 135/82 | HR 78 | Temp 97.8°F | Wt 142.5 lb

## 2023-05-09 DIAGNOSIS — Z789 Other specified health status: Secondary | ICD-10-CM

## 2023-05-09 DIAGNOSIS — E785 Hyperlipidemia, unspecified: Secondary | ICD-10-CM

## 2023-05-09 DIAGNOSIS — R300 Dysuria: Secondary | ICD-10-CM

## 2023-05-09 DIAGNOSIS — F32A Depression, unspecified: Secondary | ICD-10-CM

## 2023-05-09 DIAGNOSIS — I10 Essential (primary) hypertension: Secondary | ICD-10-CM

## 2023-05-09 LAB — POCT URINALYSIS DIPSTICK
Bilirubin, UA: NEGATIVE
Glucose, UA: NEGATIVE
Leukocytes, UA: NEGATIVE
Nitrite, UA: NEGATIVE
Protein, UA: NEGATIVE
Spec Grav, UA: 1.015 (ref 1.010–1.025)
Urobilinogen, UA: 0.2 U/dL
pH, UA: 5.5 (ref 5.0–8.0)

## 2023-05-09 MED ORDER — CITALOPRAM HYDROBROMIDE 20 MG PO TABS: 20.0000 mg | ORAL_TABLET | Freq: Every day | ORAL | 0 refills | Status: DC

## 2023-05-09 MED ORDER — OMEPRAZOLE 20 MG PO CPDR: DELAYED_RELEASE_CAPSULE | ORAL | 0 refills | Status: DC

## 2023-05-09 MED ORDER — CEPHALEXIN 500 MG PO CAPS: 500.0000 mg | ORAL_CAPSULE | Freq: Four times a day (QID) | ORAL | 0 refills | Status: AC

## 2023-05-09 MED ORDER — ATORVASTATIN CALCIUM 20 MG PO TABS: 20.0000 mg | ORAL_TABLET | Freq: Every day | ORAL | 0 refills | Status: DC

## 2023-05-09 MED ORDER — AMLODIPINE BESYLATE 10 MG PO TABS: 10.0000 mg | ORAL_TABLET | Freq: Every day | ORAL | 1 refills | Status: DC

## 2023-05-09 MED ORDER — LOSARTAN POTASSIUM 100 MG PO TABS: 100.0000 mg | ORAL_TABLET | Freq: Every day | ORAL | 1 refills | Status: DC

## 2023-05-09 MED ORDER — CETIRIZINE HCL 10 MG PO TABS: 10.0000 mg | ORAL_TABLET | Freq: Every day | ORAL | 0 refills | Status: DC | PRN

## 2023-05-09 NOTE — Progress Notes (Signed)
This is session # 6 with Joyce Roberts for individual [facility] services for anxiety and depression. Patient presents alone with Spanish interpreter. BHP spent 50 minutes with patient.    Patient reports anxious symptoms including intrusive worries and autonomic hyperactivity and depressive symptoms including tiredness, lack of motivation, hopelessness, irritability, decreased interest, and sadness. Symptoms are related to chronic pain, poor sleep and eating patterns, recent health concerns, and grief/trauma hx. Patient will likely benefit from individual support services to aid coping, processing, and strengths-based perspective with referral to Joyce Roberts for specialized trauma counseling.   This procedure has been fully reviewed with the patient and informed consent has been obtained.   Patient location: Pt seen in clinic via telehealth.   I connected with Joyce Roberts on 05/09/23 by a video enabled telemedicine application and verified that I am speaking with the correct person.   I discussed the limitations of evaluation and management by telemedicine. The patient expressed understanding and agreed to proceed.     ASSESSMENT AND PLAN   Current diagnosis: anxiety and depression   We created the following treatment plan at today's appointment:               1)  05/09/23               2)  Decrease anxious and depressive symptoms through increased pain management, support, processing, and coping and self-care skills               3)  Medication management with Roberts. On 12/06/22, pt reports a felt improvement in depressive symptoms overall attributed to medication.               4)  Next screening due in 1 session.     HISTORY OF PRESENT ILLNESS   Mental Status: Joyce Roberts presented oriented X4. Mood and affect were anxious and depressed. Judgment was appropriate. Memory was appropriate. Thought processes and content were  appropriate. Speech was appropriate. Patient denies SI/HI.    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   Narrative: Met with pt for scheduled appointment. Person-centered approach used to continue therapeutic alliance. Pt provides updates since previous visit including a lot of sickness, increased frustration, and worry around health of grandchild. Pt describes worries and related hx of care taking. Normalized experience and offered reframes for her capacity to care for and support loved ones. From CBT, discussed the impact of thoughts on our perspective and mood. Pt plans to watch her cognitions during difficult times of sickness, lean on her faith for strength, and look for small opportunities to have a break for her own self-care. Assessed for other concerns pt wished to address today, which they denied. Scheduled follow up.   Effectiveness of the intervention(s) and the beneficiary's response or progress toward goal(s):  Patient is in a planning stage of change to engage in treatment plan.

## 2023-05-09 NOTE — Progress Notes (Signed)
BP 135/82   Pulse 78   Temp 97.8 F (36.6 C)   Wt 142 lb 8 oz (64.6 kg)   SpO2 99%   BMI 27.83 kg/m    Subjective:    Patient ID: Joyce Roberts, female    DOB: [birth year], 53 y.o.   MRN: [id number]  HPI: Joyce Roberts is a 53 y.o. female presenting on 05/09/2023 for Hypertension, Hyperlipidemia, and Dysuria (Pt reports burning during urination, urinary frequency, vaginal itching, and lower abd pain for about a week.)   HPI   Chief Complaint  Patient presents with   Hypertension   Hyperlipidemia   Dysuria    Pt reports burning during urination, urinary frequency, vaginal itching, and lower abd pain for about a week.    Pt continues with [facility] for counseling.  She says the citalopram is doing well helping her mood.     Relevant past medical, surgical, family and social history reviewed and updated as indicated. Interim medical history since our last visit reviewed. Allergies and medications reviewed and updated.    Current Outpatient Medications:    acetaminophen (TYLENOL) 500 MG tablet, Take 1,000 mg by mouth as needed., Disp: , Rfl:    amLODipine (NORVASC) 5 MG tablet, Take 1 tablet (5 mg total) by mouth daily. Tome una tableta por boca diaria, Disp: 90 tablet, Rfl: 0   atorvastatin (LIPITOR) 20 MG tablet, Take 1 tablet (20 mg total) by mouth daily., Disp: 90 tablet, Rfl: 0   cetirizine (ZYRTEC) 10 MG tablet, Take 1 tablet (10 mg total) by mouth daily as needed for allergies. Tome una tableta por boca diaria cuando sea necesario para las [place], Disp: 90 tablet, Rfl: 0   citalopram (CELEXA) 20 MG tablet, Take 1 tablet (20 mg total) by mouth daily., Disp: 90 tablet, Rfl: 0   ibuprofen (ADVIL) 200 MG tablet, Take 200 mg by mouth daily as needed., Disp: , Rfl:    losartan (COZAAR) 100 MG tablet, Take 1 tablet (100 mg total) by mouth daily. Tome una tableta por boca diaria, Disp: 90 tablet, Rfl: 1   omeprazole (PRILOSEC) 20 MG capsule, 1 PO BID. 1 POR BOCA DOS VECES  AL DIA, Disp: 180 capsule, Rfl: 0   gabapentin (NEURONTIN) 100 MG capsule, Take 1 capsule (100 mg total) by mouth 3 (three) times daily., Disp: 90 capsule, Rfl: 0   promethazine (PHENERGAN) 12.5 MG tablet, Take 2 tablets (25 mg total) by mouth every 6 (six) hours as needed for nausea or vomiting. (Patient not taking: Reported on 02/07/2023), Disp: 20 tablet, Rfl: 0   promethazine-dextromethorphan (PROMETHAZINE-DM) 6.25-15 MG/5ML syrup, take 2.5 mLs by mouth 4 (four) times daily as needed for cough. Tome 2.5 ML por boca 4 veces al dia cuando sea necesario para la toz (Patient not taking: Reported on 05/09/2023), Disp: 118 mL, Rfl: 0   traMADol (ULTRAM) 50 MG tablet, Take 1 tablet (50 mg total) by mouth every 6 (six) hours as needed for severe pain (pain score 7-10). (Patient not taking: Reported on 02/07/2023), Disp: 15 tablet, Rfl: 0    Review of Systems  Per HPI unless specifically indicated above     Objective:    BP 135/82   Pulse 78   Temp 97.8 F (36.6 C)   Wt 142 lb 8 oz (64.6 kg)   SpO2 99%   BMI 27.83 kg/m   Wt Readings from Last 3 Encounters:  05/09/23 142 lb 8 oz (64.6 kg)  04/12/23 151 lb (68.5 kg)  02/08/23 146 lb (66.2 kg)    Physical Exam Vitals reviewed.  Constitutional:      General: She is not in acute distress.    Appearance: She is well-developed. She is not toxic-appearing.  HENT:     Head: Normocephalic and atraumatic.  Cardiovascular:     Rate and Rhythm: Normal rate and regular rhythm.  Pulmonary:     Effort: Pulmonary effort is normal.     Breath sounds: Normal breath sounds.  Abdominal:     General: Bowel sounds are normal.     Palpations: Abdomen is soft. There is no mass.     Tenderness: There is no abdominal tenderness.  Musculoskeletal:     Cervical back: Neck supple.     Right lower leg: No edema.     Left lower leg: No edema.  Lymphadenopathy:     Cervical: No cervical adenopathy.  Skin:    General: Skin is warm and dry.  Neurological:      Mental Status: She is alert and oriented to person, place, and time.  Psychiatric:        Attention and Perception: Attention normal.        Mood and Affect: Affect is flat.        Speech: Speech normal.        Behavior: Behavior normal. Behavior is cooperative.     Comments: Pt at baseline     Results for orders placed or performed during the hospital encounter of 05/05/23  Lipid panel   Collection Time: 05/05/23  1:55 PM  Result Value Ref Range   Cholesterol 198 0 - 200 mg/dL   Triglycerides 161 (H) <150 mg/dL   HDL 46 >09 mg/dL   Total CHOL/HDL Ratio 4.3 RATIO   VLDL 41 (H) 0 - 40 mg/dL   LDL Cholesterol 604 (H) 0 - 99 mg/dL  Comprehensive metabolic panel   Collection Time: 05/05/23  1:55 PM  Result Value Ref Range   Sodium 139 135 - 145 mmol/L   Potassium 4.2 3.5 - 5.1 mmol/L   Chloride 103 98 - 111 mmol/L   CO2 26 22 - 32 mmol/L   Glucose, Bld 97 70 - 99 mg/dL   BUN 13 6 - 20 mg/dL   Creatinine, Ser 5.40 0.44 - 1.00 mg/dL   Calcium 9.2 8.9 - 98.1 mg/dL   Total Protein 7.8 6.5 - 8.1 g/dL   Albumin 4.5 3.5 - 5.0 g/dL   AST 19 15 - 41 U/L   ALT 21 0 - 44 U/L   Alkaline Phosphatase 77 38 - 126 U/L   Total Bilirubin 0.6 0.0 - 1.2 mg/dL   GFR, Estimated >19 >14 mL/min   Anion gap 10 5 - 15      Assessment & Plan:   Encounter Diagnoses  Name Primary?   Primary hypertension Yes   Hyperlipidemia, unspecified hyperlipidemia type    Anxiety and depression    Dysuria    Not proficient in English language      -reviewed labs with pt -rx keflex for urine -increase amlodipine -continue atorvastatin, zyrtec, citalopram, losartan, omeprazole. -pt to continue with [facility] -pt to follow up 3 months.  She is to contact office sooner prn

## 2023-05-11 ENCOUNTER — Other Ambulatory Visit: Payer: Self-pay | Admitting: Physician Assistant

## 2023-05-11 MED ORDER — FLUCONAZOLE 150 MG PO TABS: 150.0000 mg | ORAL_TABLET | Freq: Once | ORAL | 0 refills | Status: AC

## 2023-05-30 ENCOUNTER — Ambulatory Visit: Payer: Self-pay

## 2023-06-13 ENCOUNTER — Ambulatory Visit: Payer: Self-pay

## 2023-06-13 DIAGNOSIS — F32A Depression, unspecified: Secondary | ICD-10-CM

## 2023-06-20 NOTE — Progress Notes (Signed)
 This is session #7 with Joyce Roberts for individual [facility] services for anxiety and depression. Patient presents alone with Spanish interpreter. BHP spent 50 minutes with patient.    Patient reports anxious symptoms including increased fear, intrusive worries and autonomic hyperactivity and depressive symptoms including tiredness, lack of motivation, hopelessness, irritability, decreased interest, and sadness. Symptoms are related to socioeconomic factors, chronic pain, poor sleep and eating patterns, health concerns, and grief/trauma hx. Patient will likely benefit from individual support services to aid coping, processing, and strengths-based perspective with referral to Spanish-speaking provider for specialized trauma counseling as desired.   This procedure has been fully reviewed with the patient and informed consent has been obtained.   Patient location: Pt seen in clinic via telehealth.   I connected with Joyce Roberts on 06/13/23 by a video enabled telemedicine application and verified that I am speaking with the correct person.   I discussed the limitations of evaluation and management by telemedicine. The patient expressed understanding and agreed to proceed.     ASSESSMENT AND PLAN   Current diagnosis: anxiety and depression   We created the following treatment plan at today's appointment:               1)  06/13/23               2)  Decrease anxious and depressive symptoms through increased pain management, support, processing, and coping and self-care skills               3)  Medication management with Provider. On 12/06/22, pt reports a felt improvement in depressive symptoms overall attributed to medication.               4)  Next screening due in 0 session.     HISTORY OF PRESENT ILLNESS   Mental Status: Linz[place] presented oriented X4. Mood and affect were anxious and depressed. Judgment was appropriate. Memory was appropriate. Thought processes and content were  appropriate. Speech  was appropriate. Patient denies SI/HI.   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   Narrative: Met with pt for scheduled appointment. Person-centered approach used to continue therapeutic alliance. Pt provides updates since previous visit including increased fear tied to socioeconomic stressors. Support provided to explore experience and related worries. Affirmations offered to acknowledge impact and name themes of injustice. Discussed safety and legal resources in collaboration with interpreter. Strategies for decreasing fear named including intentional distractions, limiting intake of the news, and regulation/grounding based in interests like faith, music, writing, or spending time with loved ones. Assessed for other concerns pt wished to address today, which they denied. Scheduled follow up.   Effectiveness of the intervention(s) and the beneficiary's response or progress toward goal(s):  Patient is in a planning stage of change to engage in treatment plan.

## 2023-07-04 ENCOUNTER — Ambulatory Visit: Payer: Self-pay

## 2023-07-04 DIAGNOSIS — F419 Anxiety disorder, unspecified: Secondary | ICD-10-CM

## 2023-07-12 NOTE — Progress Notes (Signed)
 This is session #8 with Joyce Roberts for individual [facility] services for anxiety and depression. Patient presents alone with Spanish interpreter. BHP spent 50 minutes with patient.    Patient reports anxious symptoms including increased fear, intrusive worries and autonomic hyperactivity and depressive symptoms including tiredness, lack of motivation, hopelessness, irritability, decreased interest, and sadness. Symptoms are related to socioeconomic factors, chronic pain, poor sleep and eating patterns, health concerns, and grief/trauma hx. Patient will likely benefit from individual support services to aid coping, processing, and strengths-based perspective with referral to Spanish-speaking provider for specialized trauma counseling as desired.0   This procedure has been fully reviewed with the patient and informed consent has been obtained.   Patient location: Pt seen in clinic via telehealth.   I connected with Joyce Roberts on 07/04/23 by a video enabled telemedicine application and verified that I am speaking with the correct person.   I discussed the limitations of evaluation and management by telemedicine. The patient expressed understanding and agreed to proceed.     ASSESSMENT AND PLAN   Current diagnosis: anxiety and depression   We created the following treatment plan at today's appointment:               1)  07/04/23               2)  Decrease anxious and depressive symptoms through increased pain management, support, processing, and coping and self-care skills               3)  Medication management with Provider. On 12/06/22, pt reports a felt improvement in depressive symptoms overall attributed to medication.               4)  Next screening due in 0 session.     HISTORY OF PRESENT ILLNESS   Mental Status: Joyce Roberts presented oriented X4. Mood and affect were anxious and depressed. Judgment was appropriate. Memory was appropriate. Thought processes and content were  appropriate. Speech  was appropriate. Patient denies SI/HI.   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   Narrative: Met with pt for scheduled appointment. Person-centered approach used to continue therapeutic alliance. Pt provides updates since previous visit including felt improvement attributed to distraction strategies and increased grief triggers during this time of year. Support provided to explore grief narratives noting trauma experience and hx of postpartum depression. Pt names tearfulness and feelings of overwhelm with shame and existential questions. Normalized pt's grief and trauma reactions through psychoeducation. Assessed for other concerns pt wished to address today, which they denied. Scheduled follow up.   Effectiveness of the intervention(s) and the beneficiary's response or progress toward goal(s):  Patient is in a planning stage of change to engage in treatment plan.

## 2023-07-25 ENCOUNTER — Ambulatory Visit: Payer: Self-pay

## 2023-07-25 DIAGNOSIS — F32A Depression, unspecified: Secondary | ICD-10-CM

## 2023-08-07 NOTE — Progress Notes (Signed)
 This is session #9 with Joyce Roberts for individual [facility] services for anxiety and depression. Patient presents alone with Spanish interpreter. BHP spent 45 minutes with patient.    Patient reports anxious symptoms including fear, intrusive worries and autonomic hyperactivity and depressive symptoms including tiredness, lack of motivation, hopelessness, irritability, decreased interest, and sadness. Symptoms are related to socioeconomic factors, chronic pain, poor sleep and eating patterns, health concerns, and grief/trauma hx. Patient will likely benefit from individual support services to aid coping, processing, and strengths-based perspective with referral to Spanish-speaking provider for specialized trauma counseling as desired.   This procedure has been fully reviewed with the patient and informed consent has been obtained.   Patient location: Pt seen in clinic via telehealth.   I connected with Joyce Roberts on 07/25/23 by a video enabled telemedicine application and verified that I am speaking with the correct person.   I discussed the limitations of evaluation and management by telemedicine. The patient expressed understanding and agreed to proceed.     ASSESSMENT AND PLAN   Current diagnosis: anxiety and depression   We created the following treatment plan at today's appointment:               1)  07/25/23               2)  Decrease anxious and depressive symptoms through increased pain management, support, processing, and coping and self-care skills               3)  Medication management with Provider. On 12/06/22, pt reports a felt improvement in depressive symptoms overall attributed to medication.               4)  Next screening due in 0 session.     HISTORY OF PRESENT ILLNESS   Mental Status: Joyce Roberts presented oriented X4. Mood and affect were anxious and depressed. Judgment was appropriate. Memory was appropriate. Thought processes and content were  appropriate. Speech was  appropriate. Patient denies SI/HI.   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   Narrative: Met with pt for scheduled appointment. Person-centered approach used to continue therapeutic alliance. Pt provides updates since previous visit including continued fear and grief. Support provided to explore symptoms and normalization offered. Pt names "stuck" memories and, from a narrative and sensorimotor approach, encouraged pt to create alternate, replacement images that better fit the present. Reminded pt of EMDR referral. Assessed for other concerns pt wished to address today, which they denied. Scheduled follow up.   Effectiveness of the intervention(s) and the beneficiary's response or progress toward goal(s):  Patient is in a planning stage of change to engage in treatment plan.

## 2023-08-08 ENCOUNTER — Other Ambulatory Visit (HOSPITAL_COMMUNITY)
Admission: RE | Admit: 2023-08-08 | Discharge: 2023-08-08 | Disposition: A | Discharge status: Home / Self Care | Payer: Self-pay | Attending: Physician Assistant | Admitting: Physician Assistant

## 2023-08-08 ENCOUNTER — Encounter: Payer: Self-pay | Admitting: Physician Assistant

## 2023-08-08 ENCOUNTER — Ambulatory Visit: Payer: Self-pay | Admitting: Physician Assistant

## 2023-08-08 VITALS — BP 127/88 | HR 84 | Temp 98.0°F | Ht 60.0 in | Wt 141.8 lb

## 2023-08-08 DIAGNOSIS — F419 Anxiety disorder, unspecified: Secondary | ICD-10-CM

## 2023-08-08 DIAGNOSIS — R300 Dysuria: Secondary | ICD-10-CM | POA: Insufficient documentation

## 2023-08-08 DIAGNOSIS — I10 Essential (primary) hypertension: Secondary | ICD-10-CM

## 2023-08-08 DIAGNOSIS — R221 Localized swelling, mass and lump, neck: Secondary | ICD-10-CM

## 2023-08-08 DIAGNOSIS — R319 Hematuria, unspecified: Secondary | ICD-10-CM | POA: Insufficient documentation

## 2023-08-08 DIAGNOSIS — Z789 Other specified health status: Secondary | ICD-10-CM

## 2023-08-08 DIAGNOSIS — Z1239 Encounter for other screening for malignant neoplasm of breast: Secondary | ICD-10-CM

## 2023-08-08 LAB — POCT URINALYSIS DIPSTICK
Glucose, UA: NEGATIVE
Leukocytes, UA: NEGATIVE
Nitrite, UA: NEGATIVE
Protein, UA: POSITIVE — AB
Spec Grav, UA: 1.02 (ref 1.010–1.025)
Urobilinogen, UA: 0.2 U/dL
pH, UA: 5.5 (ref 5.0–8.0)

## 2023-08-08 MED ORDER — ATORVASTATIN CALCIUM 20 MG PO TABS: 20.0000 mg | ORAL_TABLET | Freq: Every day | ORAL | 0 refills | Status: DC

## 2023-08-08 MED ORDER — OMEPRAZOLE 20 MG PO CPDR: DELAYED_RELEASE_CAPSULE | ORAL | 0 refills | Status: DC

## 2023-08-08 MED ORDER — AMLODIPINE BESYLATE 10 MG PO TABS: 10.0000 mg | ORAL_TABLET | Freq: Every day | ORAL | 1 refills | Status: DC

## 2023-08-08 MED ORDER — CETIRIZINE HCL 10 MG PO TABS: 10.0000 mg | ORAL_TABLET | Freq: Every day | ORAL | 0 refills | Status: DC | PRN

## 2023-08-08 MED ORDER — CITALOPRAM HYDROBROMIDE 20 MG PO TABS: 20.0000 mg | ORAL_TABLET | Freq: Every day | ORAL | 0 refills | Status: DC

## 2023-08-08 MED ORDER — LOSARTAN POTASSIUM 100 MG PO TABS: 100.0000 mg | ORAL_TABLET | Freq: Every day | ORAL | 1 refills | Status: DC

## 2023-08-08 NOTE — Progress Notes (Signed)
 BP 127/88   Pulse 84   Temp 98 F (36.7 C)   Ht 5' (1.524 m)   Wt 141 lb 12 oz (64.3 kg)   SpO2 99%   BMI 27.68 kg/m    Subjective:    Patient ID: Joyce Roberts, female    DOB: [birth year], 53 y.o.   MRN: [id number]  HPI: Joyce Roberts is a 53 y.o. female presenting on 08/08/2023 for Hypertension and Mental Health Problem   HPI  Chief Complaint  Patient presents with   Hypertension   Mental Health Problem     Urinary burning > 1 wk.  Pt reported same issue at OV in February.  Rx keflex  feb 2025 due to reports dysuria.  Pt was Last seen by urology 12/21/2021. Per office note pt was supposed to f/u 3 months but did not.  Pt reports neck mass getting bigger and causing her pain.  Posterior neck just left of midline.  Pt reports citalopram  working well for her and [facility] is helping a lot aw well.   Relevant past medical, surgical, family and social history reviewed and updated as indicated. Interim medical history since our last visit reviewed. Allergies and medications reviewed and updated.   Current Outpatient Medications:    acetaminophen  (TYLENOL ) 500 MG tablet, Take 1,000 mg by mouth as needed., Disp: , Rfl:    amLODipine  (NORVASC ) 10 MG tablet, Take 1 tablet (10 mg total) by mouth daily. Tome una tableta por boca diaria, Disp: 90 tablet, Rfl: 1   atorvastatin  (LIPITOR) 20 MG tablet, Take 1 tablet (20 mg total) by mouth daily., Disp: 90 tablet, Rfl: 0   cetirizine  (ZYRTEC ) 10 MG tablet, Take 1 tablet (10 mg total) by mouth daily as needed for allergies. Tome una tableta por boca diaria cuando sea necesario para las [place], Disp: 90 tablet, Rfl: 0   citalopram  (CELEXA ) 20 MG tablet, Take 1 tablet (20 mg total) by mouth daily., Disp: 90 tablet, Rfl: 0   ibuprofen  (ADVIL ) 200 MG tablet, Take 200 mg by mouth daily as needed., Disp: , Rfl:    losartan  (COZAAR ) 100 MG tablet, Take 1 tablet (100 mg total) by mouth daily. Tome una tableta por boca diaria, Disp: 90 tablet,  Rfl: 1   omeprazole  (PRILOSEC) 20 MG capsule, 1 PO BID. 1 POR BOCA DOS VECES AL DIA, Disp: 180 capsule, Rfl: 0   gabapentin  (NEURONTIN ) 100 MG capsule, Take 1 capsule (100 mg total) by mouth 3 (three) times daily., Disp: 90 capsule, Rfl: 0   promethazine  (PHENERGAN ) 12.5 MG tablet, Take 2 tablets (25 mg total) by mouth every 6 (six) hours as needed for nausea or vomiting. (Patient not taking: Reported on 02/07/2023), Disp: 20 tablet, Rfl: 0   promethazine -dextromethorphan (PROMETHAZINE -DM) 6.25-15 MG/5ML syrup, take 2.5 mLs by mouth 4 (four) times daily as needed for cough. Tome 2.5 ML por boca 4 veces al dia cuando sea necesario para la toz (Patient not taking: Reported on 05/09/2023), Disp: 118 mL, Rfl: 0   traMADol  (ULTRAM ) 50 MG tablet, Take 1 tablet (50 mg total) by mouth every 6 (six) hours as needed for severe pain (pain score 7-10). (Patient not taking: Reported on 02/07/2023), Disp: 15 tablet, Rfl: 0   Review of Systems  Per HPI unless specifically indicated above     Objective:     BP 127/88   Pulse 84   Temp 98 F (36.7 C)   Ht 5' (1.524 m)   Wt 141 lb 12 oz (  64.3 kg)   SpO2 99%   BMI 27.68 kg/m   Wt Readings from Last 3 Encounters:  08/08/23 141 lb 12 oz (64.3 kg)  05/09/23 142 lb 8 oz (64.6 kg)  04/12/23 151 lb (68.5 kg)    Physical Exam Vitals reviewed.  Constitutional:      General: She is not in acute distress.    Appearance: She is well-developed. She is not toxic-appearing.  HENT:     Head: Normocephalic and atraumatic.  Neck:     Comments: Soft mobile mass just left of center, posteriorly, about 1 cm in size. Cardiovascular:     Rate and Rhythm: Normal rate and regular rhythm.  Pulmonary:     Effort: Pulmonary effort is normal.     Breath sounds: Normal breath sounds.  Musculoskeletal:     Cervical back: Normal range of motion and neck supple.  Lymphadenopathy:     Cervical: No cervical adenopathy.  Skin:    General: Skin is warm and dry.   Neurological:     Mental Status: She is alert and oriented to person, place, and time.  Psychiatric:        Behavior: Behavior normal.     Results for orders placed or performed in visit on 08/08/23  POCT Urinalysis Dipstick   Collection Time: 08/08/23  2:14 PM  Result Value Ref Range   Color, UA YELLOW    Clarity, UA CLEAR    Glucose, UA Negative Negative   Bilirubin, UA SMALL    Ketones, UA TRACE    Spec Grav, UA 1.020 1.010 - 1.025   Blood, UA MODERATE    pH, UA 5.5 5.0 - 8.0   Protein, UA Positive (A) Negative   Urobilinogen, UA 0.2 0.2 or 1.0 E.U./dL   Nitrite, UA NEG    Leukocytes, UA Negative Negative   Appearance     Odor        Assessment & Plan:     Encounter Diagnoses  Name Primary?   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  She is to contact office sooner prn

## 2023-08-09 ENCOUNTER — Ambulatory Visit: Payer: Self-pay | Admitting: Physician Assistant

## 2023-08-09 ENCOUNTER — Other Ambulatory Visit: Payer: Self-pay | Admitting: Physician Assistant

## 2023-08-09 DIAGNOSIS — R319 Hematuria, unspecified: Secondary | ICD-10-CM

## 2023-08-09 DIAGNOSIS — R300 Dysuria: Secondary | ICD-10-CM

## 2023-08-09 LAB — URINE CULTURE: Culture: NO GROWTH

## 2023-08-14 ENCOUNTER — Telehealth: Payer: Self-pay

## 2023-08-14 NOTE — Telephone Encounter (Signed)
 Telephoned patient at mobile number. Left a voice message with [employer] (scholarship) contact information.

## 2023-08-15 ENCOUNTER — Ambulatory Visit: Payer: Self-pay

## 2023-08-22 ENCOUNTER — Ambulatory Visit: Payer: Self-pay

## 2023-08-22 DIAGNOSIS — F32A Depression, unspecified: Secondary | ICD-10-CM

## 2023-08-23 ENCOUNTER — Ambulatory Visit (HOSPITAL_COMMUNITY)
Admission: RE | Admit: 2023-08-23 | Discharge: 2023-08-23 | Disposition: A | Discharge status: Home / Self Care | Payer: Self-pay | Source: Ambulatory Visit | Attending: Physician Assistant | Admitting: Physician Assistant

## 2023-08-23 DIAGNOSIS — R221 Localized swelling, mass and lump, neck: Secondary | ICD-10-CM | POA: Insufficient documentation

## 2023-09-07 ENCOUNTER — Other Ambulatory Visit: Payer: Self-pay | Admitting: Physician Assistant

## 2023-09-07 DIAGNOSIS — R221 Localized swelling, mass and lump, neck: Secondary | ICD-10-CM

## 2023-09-07 DIAGNOSIS — R9389 Abnormal findings on diagnostic imaging of other specified body structures: Secondary | ICD-10-CM
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
## 2023-09-12 ENCOUNTER — Ambulatory Visit: Payer: Self-pay

## 2023-09-26 ENCOUNTER — Ambulatory Visit: Payer: Self-pay

## 2023-09-26 ENCOUNTER — Ambulatory Visit: Payer: Self-pay | Admitting: Physician Assistant

## 2023-09-26 DIAGNOSIS — J069 Acute upper respiratory infection, unspecified: Secondary | ICD-10-CM

## 2023-09-26 DIAGNOSIS — Z789 Other specified health status: Secondary | ICD-10-CM

## 2023-09-26 MED ORDER — PSEUDOEPHEDRINE HCL 60 MG PO TABS: 60.0000 mg | ORAL_TABLET | Freq: Three times a day (TID) | ORAL | 1 refills | Status: DC | PRN

## 2023-09-26 NOTE — Progress Notes (Unsigned)
   There were no vitals taken for this visit.   Subjective:    Patient ID: Joyce Roberts, female    DOB: 07/01/1970, 53 y.o.   MRN: [id number]  HPI: Joyce Roberts is a 53 y.o. female presenting on 09/26/2023 for No chief complaint on file.   HPI  This is a telemedicine visit.  It is via Telephone as pt lives in remote area and gets poor connection for video appointment.  I connected with  Joyce Roberts on 09/27/23 by a video enabled telemedicine application and verified that I am speaking with the correct person using two identifiers.   I discussed the limitations of evaluation and management by telemedicine. The patient expressed understanding and agreed to proceed.  Pt is at home.  Provider and translator are at office.   Pt reports Lots of congestion since last week.   Lots of HA and face pressure.  No cough. No fever.  No body aches.  A little sneezing.  No n/v/d.    Apap and ibu prn. She still takes zyrtec .  Lots of buggers but nothing comes out when she blows.       Relevant past medical, surgical, family and social history reviewed and updated as indicated. Interim medical history since our last visit reviewed. Allergies and medications reviewed and updated.  Review of Systems  Per HPI unless specifically indicated above     Objective:    There were no vitals taken for this visit.  Wt Readings from Last 3 Encounters:  08/08/23 141 lb 12 oz (64.3 kg)  05/09/23 142 lb 8 oz (64.6 kg)  04/12/23 151 lb (68.5 kg)    Physical Exam Pulmonary:     Effort: Pulmonary effort is normal. No respiratory distress.     Comments: Pt is talking in complete sentences without dyspnea Neurological:     Mental Status: She is alert and oriented to person, place, and time.  Psychiatric:        Speech: Speech normal.           Assessment & Plan:   Encounter Diagnoses  Name Primary?   Upper respiratory tract infection, unspecified type Yes   Not proficient in English  language      -will send rx for decongestant to [facility] -rest, fluids.  Apap or ibu prn -pt to contact office if symptoms persist or develops new symtpoms -pt reminded of CT appointment on 10/16/23

## 2023-09-27 ENCOUNTER — Encounter: Payer: Self-pay | Admitting: Physician Assistant

## 2023-10-10 ENCOUNTER — Ambulatory Visit: Payer: Self-pay

## 2023-10-10 ENCOUNTER — Ambulatory Visit: Payer: Self-pay | Admitting: Physician Assistant

## 2023-10-10 ENCOUNTER — Encounter: Payer: Self-pay | Admitting: Physician Assistant

## 2023-10-10 DIAGNOSIS — Z789 Other specified health status: Secondary | ICD-10-CM

## 2023-10-10 DIAGNOSIS — J069 Acute upper respiratory infection, unspecified: Secondary | ICD-10-CM

## 2023-10-10 MED ORDER — AMOXICILLIN 500 MG PO CAPS
500.0000 mg | ORAL_CAPSULE | Freq: Three times a day (TID) | ORAL | 0 refills | Status: AC
Start: 2023-10-10 — End: 2023-10-20

## 2023-10-10 MED ORDER — PSEUDOEPHEDRINE HCL 60 MG PO TABS: 60.0000 mg | ORAL_TABLET | Freq: Three times a day (TID) | ORAL | 1 refills | Status: DC | PRN

## 2023-10-10 NOTE — Progress Notes (Signed)
   There were no vitals taken for this visit.   Subjective:    Patient ID: Joyce Roberts, female    DOB: 1970/12/31, 53 y.o.   MRN: [id number]  HPI: Joyce Roberts is a 53 y.o. female presenting on 10/10/2023 for No chief complaint on file.   HPI  This is a telemedicine appointment.  [place].  I connected with  Joyce Roberts on 10/10/23 by a video enabled telemedicine application and verified that I am speaking with the correct person using two identifiers.   I discussed the limitations of evaluation and management by telemedicine. The patient expressed understanding and agreed to proceed.   Pt is at home.  Provider and translator are at office.     Pt had appointment on 09/26/23 for same and she was prescribed sudafed.  She says she was feeling better last week and is now feeling worse again.  She complains of HA as her worst symptom.  She also has some nausea but no emesis or diarrhea.  She denies cough, fever.  She says no one else at home is sick.     Relevant past medical, surgical, family and social history reviewed and updated as indicated. Interim medical history since our last visit reviewed. Allergies and medications reviewed and updated.  Review of Systems  Per HPI unless specifically indicated above     Objective:    There were no vitals taken for this visit.  Wt Readings from Last 3 Encounters:  08/08/23 141 lb 12 oz (64.3 kg)  05/09/23 142 lb 8 oz (64.6 kg)  04/12/23 151 lb (68.5 kg)    Physical Exam Pulmonary:     Effort: Pulmonary effort is normal. No respiratory distress.     Comments: Pt is talking in complete sentences without dyspnea. Neurological:     Mental Status: She is alert and oriented to person, place, and time.           Assessment & Plan:   Encounter Diagnoses  Name Primary?   Upper respiratory tract infection, unspecified type Yes    Not proficient in English language       -rx amoxil  and refill sudafed -recommended fluids and rest -pt was reminded of appointment on 10/16/23 for CT to evaluation neck swelling -pt to RTO if worsens or persists

## 2023-10-16 ENCOUNTER — Ambulatory Visit (HOSPITAL_COMMUNITY)
Admission: RE | Admit: 2023-10-16 | Discharge: 2023-10-16 | Disposition: A | Discharge status: Home / Self Care | Payer: Self-pay | Source: Ambulatory Visit | Attending: Physician Assistant | Admitting: Physician Assistant

## 2023-10-16 ENCOUNTER — Encounter (HOSPITAL_COMMUNITY): Payer: Self-pay

## 2023-10-16 DIAGNOSIS — R9389 Abnormal findings on diagnostic imaging of other specified body structures: Secondary | ICD-10-CM | POA: Insufficient documentation

## 2023-10-16 DIAGNOSIS — R221 Localized swelling, mass and lump, neck: Secondary | ICD-10-CM | POA: Insufficient documentation

## 2023-10-16 MED ORDER — IOHEXOL 300 MG/ML  SOLN
75.0000 mL | Freq: Once | INTRAMUSCULAR | Status: AC | PRN
  Administered 2023-10-16: 75 mL via INTRAVENOUS

## 2023-10-23 ENCOUNTER — Other Ambulatory Visit: Payer: Self-pay | Admitting: Physician Assistant

## 2023-10-23 ENCOUNTER — Ambulatory Visit: Payer: Self-pay | Admitting: Physician Assistant

## 2023-10-23 DIAGNOSIS — E785 Hyperlipidemia, unspecified: Secondary | ICD-10-CM

## 2023-10-23 DIAGNOSIS — I10 Essential (primary) hypertension: Secondary | ICD-10-CM

## 2023-10-24 ENCOUNTER — Ambulatory Visit: Payer: Self-pay

## 2023-11-02 ENCOUNTER — Other Ambulatory Visit (HOSPITAL_COMMUNITY)
Admission: RE | Admit: 2023-11-02 | Discharge: 2023-11-02 | Disposition: A | Discharge status: Home / Self Care | Payer: Self-pay | Source: Ambulatory Visit | Attending: Physician Assistant | Admitting: Physician Assistant

## 2023-11-02 DIAGNOSIS — E785 Hyperlipidemia, unspecified: Secondary | ICD-10-CM | POA: Insufficient documentation

## 2023-11-02 DIAGNOSIS — I10 Essential (primary) hypertension: Secondary | ICD-10-CM | POA: Insufficient documentation

## 2023-11-02 LAB — COMPREHENSIVE METABOLIC PANEL WITH GFR
ALT: 16 U/L (ref 0–44)
AST: 18 U/L (ref 15–41)
Albumin: 4.3 g/dL (ref 3.5–5.0)
Alkaline Phosphatase: 88 U/L (ref 38–126)
Anion gap: 10 (ref 5–15)
BUN: 18 mg/dL (ref 6–20)
CO2: 24 mmol/L (ref 22–32)
Calcium: 8.9 mg/dL (ref 8.9–10.3)
Chloride: 105 mmol/L (ref 98–111)
Creatinine, Ser: 0.7 mg/dL (ref 0.44–1.00)
GFR, Estimated: >60 mL/min (ref >60)
Glucose, Bld: 93 mg/dL (ref 70–99)
Potassium: 4.5 mmol/L (ref 3.5–5.1)
Sodium: 139 mmol/L (ref 135–145)
Total Bilirubin: 0.6 mg/dL (ref 0.0–1.2)
Total Protein: 7.7 g/dL (ref 6.5–8.1)

## 2023-11-02 LAB — LIPID PANEL
Cholesterol: 196 mg/dL (ref 0–200)
HDL: 44 mg/dL (ref >40)
LDL Cholesterol: 122 mg/dL — ABNORMAL HIGH (ref 0–99)
Total CHOL/HDL Ratio: 4.5 ratio
Triglycerides: 152 mg/dL — ABNORMAL HIGH (ref <150)
VLDL: 30 mg/dL (ref 0–40)

## 2023-11-07 ENCOUNTER — Ambulatory Visit: Payer: Self-pay

## 2023-11-07 ENCOUNTER — Other Ambulatory Visit: Payer: Self-pay | Admitting: Physician Assistant

## 2023-11-07 ENCOUNTER — Encounter: Payer: Self-pay | Admitting: Physician Assistant

## 2023-11-07 ENCOUNTER — Ambulatory Visit: Payer: Self-pay | Admitting: Physician Assistant

## 2023-11-07 VITALS — BP 127/89 | HR 84 | Temp 98.4°F

## 2023-11-07 DIAGNOSIS — I10 Essential (primary) hypertension: Secondary | ICD-10-CM

## 2023-11-07 DIAGNOSIS — F32A Depression, unspecified: Secondary | ICD-10-CM

## 2023-11-07 DIAGNOSIS — E785 Hyperlipidemia, unspecified: Secondary | ICD-10-CM

## 2023-11-07 DIAGNOSIS — Z789 Other specified health status: Secondary | ICD-10-CM

## 2023-11-07 DIAGNOSIS — D170 Benign lipomatous neoplasm of skin and subcutaneous tissue of head, face and neck: Secondary | ICD-10-CM

## 2023-11-07 DIAGNOSIS — Z1231 Encounter for screening mammogram for malignant neoplasm of breast: Secondary | ICD-10-CM

## 2023-11-07 MED ORDER — LOSARTAN POTASSIUM 100 MG PO TABS: 100.0000 mg | ORAL_TABLET | Freq: Every day | ORAL | 1 refills | Status: AC

## 2023-11-07 MED ORDER — AMLODIPINE BESYLATE 10 MG PO TABS: 10.0000 mg | ORAL_TABLET | Freq: Every day | ORAL | 1 refills | Status: AC

## 2023-11-07 MED ORDER — CITALOPRAM HYDROBROMIDE 20 MG PO TABS: 20.0000 mg | ORAL_TABLET | Freq: Every day | ORAL | 0 refills | Status: DC

## 2023-11-07 MED ORDER — CETIRIZINE HCL 10 MG PO TABS: 10.0000 mg | ORAL_TABLET | Freq: Every day | ORAL | 0 refills | Status: DC | PRN

## 2023-11-07 MED ORDER — ATORVASTATIN CALCIUM 20 MG PO TABS: 20.0000 mg | ORAL_TABLET | Freq: Every day | ORAL | 1 refills | Status: AC

## 2023-11-07 MED ORDER — OMEPRAZOLE 20 MG PO CPDR: DELAYED_RELEASE_CAPSULE | ORAL | 0 refills | Status: DC

## 2023-11-07 NOTE — Patient Instructions (Signed)
Lipoma Lipoma  Un lipoma es un tumor no canceroso (benigno) formado por clulas de grasa. Es un tipo muy frecuente de [employer] tejidos blandos. Por lo general, los lipomas se encuentran debajo de la piel (subcutneos). Pueden aparecer en cualquier tejido del cuerpo que contenga grasa. Las zonas en las que los lipomas aparecen con mayor frecuencia incluyen la espalda, los [place], los hombros, las nalgas y los muslos. Los lipomas crecen lentamente y, en general, son indoloros. La mayora de los lipomas no causan problemas y no requieren TEFL teacher. Cules son las causas? Se desconoce la causa de esta afeccin. Qu incrementa el riesgo? Es ms probable que sufra esta afeccin si: Tiene entre 40 y 61 aos de edad. Tiene antecedentes familiares de lipomas. Cules son los signos o sntomas? Por lo general, el lipoma aparece como una pequea protuberancia redonda debajo de la piel. En la [employer], el bulto tiene las siguientes caractersticas: Se siente suave o elstico. No causa dolor ni otros sntomas. Sin embargo, si el lipoma se encuentra en un rea en la que hace presin [employer] nervios, puede causar dolor u otros sntomas. Cmo se diagnostica? Por lo general, el lipoma puede diagnosticarse con un examen fsico. Tambin pueden hacerle estudios para confirmar el diagnstico y Sales promotion account executive otras afecciones. Las pruebas pueden incluir las siguientes: Pruebas de diagnstico por imgenes, como una exploracin por tomografa computarizada (TC) o una resonancia magntica (RM). Extraccin de [place] de tejido para analizar con un microscopio (biopsia). Cmo se trata? El tratamiento de esta afeccin depende del tamao del lipoma y si causa algn sntoma. Los lipomas pequeos que no causan problemas no requieren tratamiento. Si un lipoma se agranda o causa problemas, puede realizarse [place] para extirpar el lipoma. Los lipomas tambin pueden extirparse para mejorar el  aspecto. En la [employer], [place] procedimiento se realiza despus de aplicar un medicamento que adormece el rea (anestesia local). Pueden realizarse una liposuccin para reducir el tamao del lipoma antes de que se lo extraigan quirrgicamente, o bien se la puede realizar para extirpar el lipoma. Los lipomas se extirpan con este mtodo para limitar el tamao de la incisin y las cicatrices. Se introduce un tubo de liposuccin a travs de una pequea incisin en el lipoma y el contenido del lipoma se extrae a travs del tubo mediante succin. Siga estas indicaciones en su casa: Controle el lipoma para detectar cambios. Concurra a todas las visitas de seguimiento. Esto es importante. Dnde obtener ms informacin OrthoInfo: orthoinfo.aaos.org Comunquese con un mdico si: El lipoma se agranda o se endurece. El lipoma comienza a causarle dolor, se enrojece o se hincha cada vez ms. Estos podran ser signos de infeccin o de una afeccin ms grave. Solicite ayuda de inmediato si: Siente hormigueo o adormecimiento en el rea cerca del lipoma. Esto podra indicar que el lipoma es lo que causa dao nervioso. Resumen Un lipoma es un tumor no canceroso formado por clulas de grasa. La mayora de los lipomas no causan problemas y no requieren TEFL teacher. Si un lipoma se agranda o causa problemas, puede realizarse [place] para extirpar el lipoma. Comunquese con un mdico si el lipoma se agranda o se endurece, o si empieza a dolor, se pone de color rojo o se hincha cada vez ms. Estos podran ser signos de infeccin o de una afeccin ms grave. Esta informacin no tiene Theme park manager el consejo del mdico. Asegrese de hacerle al mdico cualquier pregunta que tenga. Document  Revised: 04/28/2021 Document Reviewed: 04/28/2021 Elsevier Patient Education  2024 [employer].

## 2023-11-07 NOTE — Progress Notes (Signed)
 BP 127/89   Pulse 84   Temp 98.4 F (36.9 C)   SpO2 99%    Subjective:    Patient ID: Joyce Roberts, female    DOB: 1970/07/20, 53 y.o.   MRN: [id number]  HPI: Joyce Roberts is a 53 y.o. female presenting on 11/07/2023 for Hypertension and Hyperlipidemia   HPI  Chief Complaint  Patient presents with   Hypertension   Hyperlipidemia     Relevant past medical, surgical, family and social history reviewed and updated as indicated. Interim medical history since our last visit reviewed. Allergies and medications reviewed and updated.   Current Outpatient Medications:    amLODipine  (NORVASC ) 10 MG tablet, Take 1 tablet (10 mg total) by mouth daily. Tome una tableta por boca diaria, Disp: 90 tablet, Rfl: 1   atorvastatin  (LIPITOR) 20 MG tablet, Take 1 tablet (20 mg total) by mouth daily., Disp: 90 tablet, Rfl: 0   cetirizine  (ZYRTEC ) 10 MG tablet, Take 1 tablet (10 mg total) by mouth daily as needed for allergies. Tome una tableta por boca diaria cuando sea necesario para las [place], Disp: 90 tablet, Rfl: 0   citalopram  (CELEXA ) 20 MG tablet, Take 1 tablet (20 mg total) by mouth daily., Disp: 90 tablet, Rfl: 0   losartan  (COZAAR ) 100 MG tablet, Take 1 tablet (100 mg total) by mouth daily. Tome una tableta por boca diaria, Disp: 90 tablet, Rfl: 1   omeprazole  (PRILOSEC) 20 MG capsule, 1 PO BID. 1 POR BOCA DOS VECES AL DIA, Disp: 180 capsule, Rfl: 0   acetaminophen  (TYLENOL ) 500 MG tablet, Take 1,000 mg by mouth as needed., Disp: , Rfl:    gabapentin  (NEURONTIN ) 100 MG capsule, Take 1 capsule (100 mg total) by mouth 3 (three) times daily., Disp: 90 capsule, Rfl: 0   ibuprofen  (ADVIL ) 200 MG tablet, Take 200 mg by mouth daily as needed., Disp: , Rfl:    promethazine  (PHENERGAN ) 12.5 MG tablet, Take 2 tablets (25 mg total) by mouth every 6 (six) hours as needed for nausea or vomiting. (Patient not taking: Reported on 02/07/2023), Disp: 20 tablet, Rfl: 0    promethazine -dextromethorphan (PROMETHAZINE -DM) 6.25-15 MG/5ML syrup, take 2.5 mLs by mouth 4 (four) times daily as needed for cough. Tome 2.5 ML por boca 4 veces al dia cuando sea necesario para la toz (Patient not taking: Reported on 05/09/2023), Disp: 118 mL, Rfl: 0   pseudoephedrine  (SUDAFED) 60 MG tablet, Take 1 tablet (60 mg total) by mouth every 8 (eight) hours as needed for congestion. Tome una tableta por boca cada 8 horas cuando sea necesario para la congestion, Disp: 30 tablet, Rfl: 1   traMADol  (ULTRAM ) 50 MG tablet, Take 1 tablet (50 mg total) by mouth every 6 (six) hours as needed for severe pain (pain score 7-10). (Patient not taking: Reported on 02/07/2023), Disp: 15 tablet, Rfl: 0    Review of Systems  Per HPI unless specifically indicated above     Objective:    BP 127/89   Pulse 84   Temp 98.4 F (36.9 C)   SpO2 99%   Wt Readings from Last 3 Encounters:  08/08/23 141 lb 12 oz (64.3 kg)  05/09/23 142 lb 8 oz (64.6 kg)  04/12/23 151 lb (68.5 kg)    Physical Exam Vitals reviewed.  Constitutional:      General: She is not in acute distress.    Appearance: She is well-developed. She is not toxic-appearing.  HENT:     Head: Normocephalic and  atraumatic.  Neck:     Comments: No palpable mass Cardiovascular:     Rate and Rhythm: Normal rate and regular rhythm.  Pulmonary:     Effort: Pulmonary effort is normal.     Breath sounds: Normal breath sounds.  Abdominal:     General: Bowel sounds are normal.     Palpations: Abdomen is soft. There is no mass.     Tenderness: There is no abdominal tenderness.  Musculoskeletal:     Cervical back: Neck supple.     Right lower leg: No edema.     Left lower leg: No edema.  Lymphadenopathy:     Cervical: No cervical adenopathy.  Skin:    General: Skin is warm and dry.  Neurological:     Mental Status: She is alert and oriented to person, place, and time.  Psychiatric:        Mood and Affect: Affect is flat.         Behavior: Behavior normal. Behavior is cooperative.     Comments: Pt at baseline     Results for orders placed or performed during the hospital encounter of 11/02/23  Lipid panel   Collection Time: 11/02/23  2:37 PM  Result Value Ref Range   Cholesterol 196 0 - 200 mg/dL   Triglycerides 847 (H) <150 mg/dL   HDL 44 >59 mg/dL   Total CHOL/HDL Ratio 4.5 RATIO   VLDL 30 0 - 40 mg/dL   LDL Cholesterol 877 (H) 0 - 99 mg/dL  Comprehensive metabolic panel with GFR   Collection Time: 11/02/23  2:37 PM  Result Value Ref Range   Sodium 139 135 - 145 mmol/L   Potassium 4.5 3.5 - 5.1 mmol/L   Chloride 105 98 - 111 mmol/L   CO2 24 22 - 32 mmol/L   Glucose, Bld 93 70 - 99 mg/dL   BUN 18 6 - 20 mg/dL   Creatinine, Ser 9.29 0.44 - 1.00 mg/dL   Calcium  8.9 8.9 - 10.3 mg/dL   Total Protein 7.7 6.5 - 8.1 g/dL   Albumin 4.3 3.5 - 5.0 g/dL   AST 18 15 - 41 U/L   ALT 16 0 - 44 U/L   Alkaline Phosphatase 88 38 - 126 U/L   Total Bilirubin 0.6 0.0 - 1.2 mg/dL   GFR, Estimated >39 >39 mL/min   Anion gap 10 5 - 15      Assessment & Plan:   Encounter Diagnoses  Name Primary?   Primary hypertension Yes   Hyperlipidemia, unspecified hyperlipidemia type    Anxiety and depression    Lipoma of neck    Not proficient in English language      Htn -reviewed labs with pt -continue amlodipine  and losartan   Dyslipidemia -continue atorvastatin   Lipoma -reviewed results CT with pt -pt was reassured that lipoma benign and was given reading information  Depression -on citalopram  -continue with [facility]  GERD -on ppi  HCM -[facility] was called and mammogram was scheduled for pt  Pt to follow up three months.  She is to contact office sooner prn

## 2023-11-07 NOTE — Progress Notes (Signed)
 This is session #11 with Joyce Roberts for individual [facility] services for anxiety and depression. Patient presents alone with Spanish interpreter. BHP spent 50 minutes with patient.    Patient reports anxious symptoms including fear, intrusive worries and autonomic hyperactivity and depressive symptoms including tiredness, lack of motivation, hopelessness, irritability, decreased interest, and sadness. Symptoms are related to socioeconomic factors, chronic pain, poor sleep and eating patterns, health concerns, and grief/trauma hx. Patient will likely benefit from individual support services to aid coping, processing, and strengths-based perspective with referral to Spanish-speaking provider for specialized trauma counseling as desired.   This procedure has been fully reviewed with the patient and informed consent has been obtained.   Patient location: Pt seen in clinic via telehealth.   I connected with Joyce Roberts on 11/07/23 by a video enabled telemedicine application and verified that I am speaking with the correct person.   I discussed the limitations of evaluation and management by telemedicine. The patient expressed understanding and agreed to proceed.     ASSESSMENT AND PLAN   Current diagnosis: anxiety and depression   We created the following treatment plan at today's appointment:               1)  11/07/23               2)  Decrease anxious and depressive symptoms through increased pain management, support, processing, and coping and self-care skills               3)  Medication management with Provider. On 12/06/22, pt reports a felt improvement in depressive symptoms overall attributed to medication.               4)  Next screening due in 0 session.     HISTORY OF PRESENT ILLNESS   Mental Status: Joyce Roberts presented oriented X4. Mood and affect were anxious and depressed. Judgment was appropriate. Memory was appropriate. Thought processes and content were  appropriate. Speech was  appropriate. Patient denies SI/HI.   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   Narrative: Met with pt for scheduled appointment. Person-centered approach used to continue therapeutic alliance. Pt provides updates since previous visit including menopause symptoms and health concerns impacting daily life. Support provided to explore concerns and normalize experience. Pt names impact of health on social interactions and mood. Encouraged pt to discuss worries with Provider (noting symptom of sweating and possible growth as her most significant concerns). Pt agreed that it would be helpful for Joyce Roberts to discuss options around her concerns with her Provider as well. Lastly, pt expresses curiosity around her depression diagnosis and medication management and would like to review it next visit. Assessed for other concerns pt wished to address today, which they denied. Scheduled follow up.   Effectiveness of the intervention(s) and the beneficiary's response or progress toward goal(s):  Patient is in a planning stage of change to engage in treatment plan.

## 2023-11-08 ENCOUNTER — Ambulatory Visit: Payer: Self-pay | Admitting: Physician Assistant

## 2023-11-23 ENCOUNTER — Ambulatory Visit (HOSPITAL_COMMUNITY)
Admission: RE | Admit: 2023-11-23 | Discharge: 2023-11-23 | Disposition: A | Discharge status: Home / Self Care | Payer: Self-pay | Source: Ambulatory Visit | Attending: Physician Assistant | Admitting: Physician Assistant

## 2023-11-23 DIAGNOSIS — Z1231 Encounter for screening mammogram for malignant neoplasm of breast: Secondary | ICD-10-CM | POA: Insufficient documentation

## 2023-11-28 ENCOUNTER — Ambulatory Visit: Payer: Self-pay

## 2023-11-30 ENCOUNTER — Ambulatory Visit: Payer: Self-pay | Admitting: Physician Assistant

## 2023-11-30 DIAGNOSIS — Z789 Other specified health status: Secondary | ICD-10-CM

## 2023-11-30 DIAGNOSIS — J069 Acute upper respiratory infection, unspecified: Secondary | ICD-10-CM

## 2023-11-30 DIAGNOSIS — F32A Depression, unspecified: Secondary | ICD-10-CM

## 2023-11-30 MED ORDER — PROMETHAZINE-DM 6.25-15 MG/5ML PO SYRP: ORAL_SOLUTION | ORAL | 0 refills | Status: AC

## 2023-11-30 MED ORDER — AZELASTINE HCL 0.1 % NA SOLN: NASAL | 99 refills | Status: AC

## 2023-11-30 MED ORDER — PSEUDOEPHEDRINE HCL 60 MG PO TABS: 60.0000 mg | ORAL_TABLET | Freq: Three times a day (TID) | ORAL | 1 refills | Status: AC | PRN

## 2023-11-30 NOTE — Progress Notes (Signed)
   There were no vitals taken for this visit.   Subjective:    Patient ID: Joyce Roberts, female    DOB: 01-23-1971, 53 y.o.   MRN: [id number]  HPI: Joyce Roberts is a 53 y.o. female presenting on 11/30/2023 for No chief complaint on file.   HPI  This is a telemediciine appointment via telephone.  Unable to have video appointment due to pt lives very remote area with poor reception  I connected with  Joyce Roberts on 11/30/23 by a video enabled telemedicine application and verified that I am speaking with the correct person using two identifiers.   I discussed the limitations of evaluation and management by telemedicine. The patient expressed understanding and agreed to proceed.  Pt is at home. Provider and translator are at  office.  Pt c/o feeling bad that started on Sunday.  She says she has HA.  Some stuffy nose and congestion, some mild ST.  She denies fever, n/v/d.  No one else at home is sick.  She has used some ibu and apap for the HA.      Relevant past medical, surgical, family and social history reviewed and updated as indicated. Interim medical history since our last visit reviewed. Allergies and medications reviewed and updated.  Review of Systems  Per HPI unless specifically indicated above     Objective:    There were no vitals taken for this visit.  Wt Readings from Last 3 Encounters:  08/08/23 141 lb 12 oz (64.3 kg)  05/09/23 142 lb 8 oz (64.6 kg)  04/12/23 151 lb (68.5 kg)    Physical Exam Pulmonary:     Effort: Pulmonary effort is normal. No respiratory distress.     Comments: Pt is talking in complete sentences without dyspnea Neurological:     Mental Status: She is alert and oriented to person, place, and time.  Psychiatric:        Attention and Perception: Attention normal.        Speech: Speech normal.             Assessment & Plan:    Encounter Diagnoses  Name Primary?   Upper respiratory tract infection, unspecified  type Yes   Anxiety and depression    Not proficient in English language      -pt counseled antibiotics not indicated -rx to help with symptomatic control. -pt is scheduled to RTO soon to adjust MH medication.  She is to contact office sooner prn

## 2023-12-12 DIAGNOSIS — B029 Zoster without complications: Secondary | ICD-10-CM | POA: Insufficient documentation

## 2023-12-12 DIAGNOSIS — R109 Unspecified abdominal pain: Secondary | ICD-10-CM | POA: Insufficient documentation

## 2023-12-13 ENCOUNTER — Other Ambulatory Visit: Payer: Self-pay

## 2023-12-13 ENCOUNTER — Emergency Department (HOSPITAL_COMMUNITY)
Admission: EM | Admit: 2023-12-13 | Discharge: 2023-12-13 | Disposition: A | Discharge status: Home / Self Care | Payer: Self-pay | Attending: Emergency Medicine | Admitting: Emergency Medicine

## 2023-12-13 ENCOUNTER — Ambulatory Visit: Payer: Self-pay | Admitting: Physician Assistant

## 2023-12-13 DIAGNOSIS — B029 Zoster without complications: Secondary | ICD-10-CM

## 2023-12-13 DIAGNOSIS — R109 Unspecified abdominal pain: Secondary | ICD-10-CM

## 2023-12-13 MED ORDER — CELECOXIB 200 MG PO CAPS: 200.0000 mg | ORAL_CAPSULE | Freq: Two times a day (BID) | ORAL | 0 refills | Status: AC

## 2023-12-13 MED ORDER — VALACYCLOVIR HCL 500 MG PO TABS
1000.0000 mg | ORAL_TABLET | Freq: Every day | ORAL | Status: DC
  Administered 2023-12-13: 1000 mg via ORAL
  Filled 2023-12-13: qty 2

## 2023-12-13 MED ORDER — GABAPENTIN 100 MG PO CAPS
100.0000 mg | ORAL_CAPSULE | Freq: Once | ORAL | Status: AC
  Administered 2023-12-13: 100 mg via ORAL
  Filled 2023-12-13: qty 1

## 2023-12-13 MED ORDER — GABAPENTIN 100 MG PO CAPS: 100.0000 mg | ORAL_CAPSULE | Freq: Three times a day (TID) | ORAL | 0 refills | Status: AC

## 2023-12-13 MED ORDER — HYDROCODONE-ACETAMINOPHEN 5-325 MG PO TABS
1.0000 | ORAL_TABLET | Freq: Once | ORAL | Status: AC
  Administered 2023-12-13: 1 via ORAL
  Filled 2023-12-13: qty 1

## 2023-12-13 MED ORDER — VALACYCLOVIR HCL 1 G PO TABS: 1000.0000 mg | ORAL_TABLET | Freq: Three times a day (TID) | ORAL | 0 refills | Status: AC

## 2023-12-13 NOTE — ED Provider Notes (Signed)
 [place] EMERGENCY DEPARTMENT AT [facility] Provider Note   CSN: [id number] Arrival date & time: 12/12/23  2339     History Chief Complaint  Patient presents with   Flank Pain    HPI Joyce Roberts is a 53 y.o. female presenting for chief complaint of right sided flank pain.  She noticed a red rash popping up around the upper aspect of her right hip.  Became exquisitely painful today.  Denies systemic fevers chills nausea vomiting syncope shortness of breath or headache.  Otherwise ambulatory tolerating p.o. intake..   Patient's recorded medical, surgical, social, medication list and allergies were reviewed in the Snapshot window as part of the initial history.   Review of Systems   Review of Systems  Constitutional:  Negative for chills and fever.  HENT:  Negative for ear pain and sore throat.   Eyes:  Negative for pain and visual disturbance.  Respiratory:  Negative for cough and shortness of breath.   Cardiovascular:  Negative for chest pain and palpitations.  Gastrointestinal:  Negative for abdominal pain and vomiting.  Genitourinary:  Negative for dysuria and hematuria.  Musculoskeletal:  Negative for arthralgias and back pain.  Skin:  Positive for rash. Negative for color change.  Neurological:  Negative for seizures and syncope.  All other systems reviewed and are negative.   Physical Exam Updated Vital Signs BP 139/85   Pulse 73   Temp 98.3 F (36.8 C) (Oral)   Resp 15   Ht 5' (1.524 m)   Wt 64.3 kg   SpO2 100%   BMI 27.68 kg/m  Physical Exam Vitals and nursing note reviewed.  Constitutional:      General: She is not in acute distress.    Appearance: She is well-developed.  HENT:     Head: Normocephalic and atraumatic.  Eyes:     Conjunctiva/sclera: Conjunctivae normal.  Cardiovascular:     Rate and Rhythm: Normal rate and regular rhythm.     Heart sounds: No murmur heard. Pulmonary:     Effort: Pulmonary effort is normal. No  respiratory distress.     Breath sounds: Normal breath sounds.  Abdominal:     General: There is no distension.     Palpations: Abdomen is soft.     Tenderness: There is no abdominal tenderness. There is no right CVA tenderness or left CVA tenderness.  Musculoskeletal:        General: No swelling or tenderness. Normal range of motion.     Cervical back: Neck supple.  Skin:    General: Skin is warm and dry.     Findings: Rash (Approximately 5 cm vesicular lesion from right hip bone to right flank) present.  Neurological:     General: No focal deficit present.     Mental Status: She is alert and oriented to person, place, and time. Mental status is at baseline.     Cranial Nerves: No cranial nerve deficit.      ED Course/ Medical Decision Making/ A&P    Procedures Procedures   Medications Ordered in ED Medications  valACYclovir  (VALTREX ) tablet 1,000 mg (1,000 mg Oral Given 12/13/23 0026)  gabapentin  (NEURONTIN ) capsule 100 mg (100 mg Oral Given 12/13/23 0027)  HYDROcodone -acetaminophen  (NORCO/VICODIN) 5-325 MG per tablet 1 tablet (1 tablet Oral Given 12/13/23 0027)    Medical Decision Making:   This is a 53 year old female presenting with a chief complaint of right flank pain.  Her clinical exam reveals a vesicular lesion most consistent  with shingles.  It is dermatomal in nature, vesicular, localized.  No signs of systemic infection. Reviewed current literature, Valtrex  1 g 3 times daily appears to be the favored management at this time. Will use gabapentin  100 mg 3 times daily for systemic management of discomfort as well as acute treatment of pain here in the emergency room.  Reviewed outside hospital prior labs that show no evidence of kidney disease.  Patient can be treated with NSAIDs for pain as well. Ambulatory tolerating p.o. intake at time of discharge with plan to follow-up with PCP within 48 hours for reassessment. Clinical Impression:  1. Flank pain   2. Herpes zoster  without complication      Discharge   Final Clinical Impression(s) / ED Diagnoses Final diagnoses:  Flank pain  Herpes zoster without complication    Rx / DC Orders ED Discharge Orders          Ordered    gabapentin  (NEURONTIN ) 100 MG capsule  3 times daily        12/13/23 0030    valACYclovir  (VALTREX ) 1000 MG tablet  3 times daily        12/13/23 0030    celecoxib  (CELEBREX ) 200 MG capsule  2 times daily        12/13/23 0030              Jerral Meth, MD 12/13/23 0030

## 2023-12-13 NOTE — ED Triage Notes (Signed)
 Pt c/o right flank pain and rash since Saturday.

## 2023-12-19 ENCOUNTER — Ambulatory Visit: Payer: Self-pay | Admitting: Physician Assistant

## 2023-12-19 ENCOUNTER — Ambulatory Visit: Payer: Self-pay

## 2023-12-19 NOTE — Congregational Nurse Program (Signed)
 Attempted follow up call to [facility] client with interpreter services. No answer this afternoon. Left Voicemail requesting return call. Follow up with client as attempt after recent ED visit on 12/13/23 evening for complaints of flank pain and rash.  Next follow up with [facility] provider is 12/26/23 and also [facility] same day.   Avelina JONELLE Skeen RN [employer]

## 2023-12-26 ENCOUNTER — Ambulatory Visit: Payer: Self-pay

## 2023-12-26 ENCOUNTER — Ambulatory Visit: Payer: Self-pay | Admitting: Physician Assistant

## 2023-12-26 ENCOUNTER — Encounter: Payer: Self-pay | Admitting: Physician Assistant

## 2023-12-26 VITALS — BP 123/79 | HR 96 | Temp 97.8°F | Ht 60.0 in | Wt 143.0 lb

## 2023-12-26 DIAGNOSIS — B029 Zoster without complications: Secondary | ICD-10-CM

## 2023-12-26 DIAGNOSIS — Z789 Other specified health status: Secondary | ICD-10-CM

## 2023-12-26 DIAGNOSIS — F32A Depression, unspecified: Secondary | ICD-10-CM

## 2023-12-26 NOTE — Progress Notes (Signed)
 BP 123/79   Pulse 96   Temp 97.8 F (36.6 C)   Ht 5' (1.524 m)   Wt 143 lb (64.9 kg)   SpO2 96%   BMI 27.93 kg/m    Subjective:    Patient ID: Joyce Roberts, female    DOB: Oct 03, 1970, 53 y.o.   MRN: [id number]  HPI: Joyce Roberts is a 53 y.o. female presenting on 12/26/2023 for Follow-up (Pt is still having pain from singles that started about 2 weeks ago. Pt states she has been taking IBU and tylenol  which helps for a couple hours. Pt states she also has some gabapentin  left and unsure if she would need more for her pain. Pt wonders if she needs to do anything else or take anything else for shingles.)   HPI  Chief Complaint  Patient presents with   Follow-up    Pt is still having pain from singles that started about 2 weeks ago. Pt states she has been taking IBU and tylenol  which helps for a couple hours. Pt states she also has some gabapentin  left and unsure if she would need more for her pain. Pt wonders if she needs to do anything else or take anything else for shingles.    Pt says the gabapentin  works well for the pain and she was given a month supply of it on 9/17.  She finished the acyclovir that she was given.      Relevant past medical, surgical, family and social history reviewed and updated as indicated. Interim medical history since our last visit reviewed. Allergies and medications reviewed and updated.    Current Outpatient Medications:    acetaminophen  (TYLENOL ) 500 MG tablet, Take 1,000 mg by mouth as needed., Disp: , Rfl:    amLODipine  (NORVASC ) 10 MG tablet, Take 1 tablet (10 mg total) by mouth daily. Tome una tableta por boca diaria, Disp: 90 tablet, Rfl: 1   atorvastatin  (LIPITOR) 20 MG tablet, Take 1 tablet (20 mg total) by mouth daily., Disp: 90 tablet, Rfl: 1   cetirizine  (ZYRTEC ) 10 MG tablet, Take 1 tablet (10 mg total) by mouth daily as needed for allergies. Tome una tableta por boca diaria cuando sea necesario para las [place], Disp: 90  tablet, Rfl: 0   citalopram  (CELEXA ) 20 MG tablet, Take 1 tablet (20 mg total) by mouth daily., Disp: 90 tablet, Rfl: 0   gabapentin  (NEURONTIN ) 100 MG capsule, Take 1 capsule (100 mg total) by mouth 3 (three) times daily., Disp: 90 capsule, Rfl: 0   ibuprofen  (ADVIL ) 200 MG tablet, Take 200 mg by mouth daily as needed., Disp: , Rfl:    losartan  (COZAAR ) 100 MG tablet, Take 1 tablet (100 mg total) by mouth daily. Tome una tableta por boca diaria, Disp: 90 tablet, Rfl: 1   omeprazole  (PRILOSEC) 20 MG capsule, 1 PO BID. 1 POR BOCA DOS VECES AL DIA, Disp: 180 capsule, Rfl: 0   azelastine  (ASTELIN ) 0.1 % nasal spray, 1 or 2 sprays each nostril twice daily.  1 o 2 espray en cada lado de la [employer] al dia (Patient not taking: Reported on 12/26/2023), Disp: 11 mL, Rfl: PRN   celecoxib  (CELEBREX ) 200 MG capsule, Take 1 capsule (200 mg total) by mouth 2 (two) times daily. (Patient not taking: Reported on 12/26/2023), Disp: 20 capsule, Rfl: 0   promethazine  (PHENERGAN ) 12.5 MG tablet, Take 2 tablets (25 mg total) by mouth every 6 (six) hours as needed for nausea or vomiting. (Patient not  taking: Reported on 12/26/2023), Disp: 20 tablet, Rfl: 0   promethazine -dextromethorphan (PROMETHAZINE -DM) 6.25-15 MG/5ML syrup, take 2.5 mLs by mouth 4 (four) times daily as needed for cough. Tome 2.5 ML por boca 4 veces al dia cuando sea necesario para la toz (Patient not taking: Reported on 12/26/2023), Disp: 118 mL, Rfl: 0   pseudoephedrine  (SUDAFED) 60 MG tablet, Take 1 tablet (60 mg total) by mouth every 8 (eight) hours as needed for congestion. Tome una tableta por boca cada 8 horas cuando sea necesario para la congestion (Patient not taking: Reported on 12/26/2023), Disp: 30 tablet, Rfl: 1   traMADol  (ULTRAM ) 50 MG tablet, Take 1 tablet (50 mg total) by mouth every 6 (six) hours as needed for severe pain (pain score 7-10). (Patient not taking: Reported on 12/26/2023), Disp: 15 tablet, Rfl: 0   valACYclovir  (VALTREX ) 1000  MG tablet, Take 1 tablet (1,000 mg total) by mouth 3 (three) times daily. (Patient not taking: Reported on 12/26/2023), Disp: 21 tablet, Rfl: 0    Review of Systems  Per HPI unless specifically indicated above     Objective:    BP 123/79   Pulse 96   Temp 97.8 F (36.6 C)   Ht 5' (1.524 m)   Wt 143 lb (64.9 kg)   SpO2 96%   BMI 27.93 kg/m   Wt Readings from Last 3 Encounters:  12/26/23 143 lb (64.9 kg)  12/13/23 141 lb 12.1 oz (64.3 kg)  08/08/23 141 lb 12 oz (64.3 kg)    Physical Exam Constitutional:      General: She is not in acute distress.    Appearance: She is not toxic-appearing.  HENT:     Head: Normocephalic and atraumatic.  Cardiovascular:     Rate and Rhythm: Normal rate and regular rhythm.  Pulmonary:     Effort: Pulmonary effort is normal. No respiratory distress.     Breath sounds: Normal breath sounds. No wheezing or rhonchi.  Musculoskeletal:     Right lower leg: No edema.     Left lower leg: No edema.  Neurological:     Mental Status: She is alert and oriented to person, place, and time.  Psychiatric:        Behavior: Behavior normal.          Assessment & Plan:     Encounter Diagnoses  Name Primary?   Herpes zoster without complication Yes   Not proficient in English language      Pt to continue gabapentin  as needed for shingles vaccine She will follow up in early November She is to contact office sooner prn
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
## 2024-01-09 ENCOUNTER — Ambulatory Visit: Payer: Self-pay

## 2024-01-23 ENCOUNTER — Other Ambulatory Visit: Payer: Self-pay | Admitting: Physician Assistant

## 2024-01-23 DIAGNOSIS — I10 Essential (primary) hypertension: Secondary | ICD-10-CM

## 2024-01-23 DIAGNOSIS — E785 Hyperlipidemia, unspecified: Secondary | ICD-10-CM

## 2024-02-04 ENCOUNTER — Emergency Department (HOSPITAL_COMMUNITY)
Admission: EM | Admit: 2024-02-04 | Discharge: 2024-02-04 | Disposition: A | Discharge status: Home / Self Care | Payer: Self-pay | Attending: Emergency Medicine | Admitting: Emergency Medicine

## 2024-02-04 ENCOUNTER — Encounter (HOSPITAL_COMMUNITY): Payer: Self-pay | Admitting: Emergency Medicine

## 2024-02-04 DIAGNOSIS — I10 Essential (primary) hypertension: Secondary | ICD-10-CM | POA: Insufficient documentation

## 2024-02-04 DIAGNOSIS — N390 Urinary tract infection, site not specified: Secondary | ICD-10-CM | POA: Insufficient documentation

## 2024-02-04 DIAGNOSIS — R110 Nausea: Secondary | ICD-10-CM | POA: Insufficient documentation

## 2024-02-04 DIAGNOSIS — R519 Headache, unspecified: Secondary | ICD-10-CM | POA: Insufficient documentation

## 2024-02-04 DIAGNOSIS — R42 Dizziness and giddiness: Secondary | ICD-10-CM | POA: Insufficient documentation

## 2024-02-04 DIAGNOSIS — M545 Low back pain, unspecified: Secondary | ICD-10-CM | POA: Insufficient documentation

## 2024-02-04 DIAGNOSIS — Z79899 Other long term (current) drug therapy: Secondary | ICD-10-CM | POA: Insufficient documentation

## 2024-02-04 LAB — URINALYSIS, ROUTINE W REFLEX MICROSCOPIC
Bacteria, UA: NONE SEEN
Bilirubin Urine: NEGATIVE
Glucose, UA: NEGATIVE mg/dL
Ketones, ur: NEGATIVE mg/dL
Nitrite: NEGATIVE
Protein, ur: NEGATIVE mg/dL
Specific Gravity, Urine: 1.024 (ref 1.005–1.030)
pH: 6 (ref 5.0–8.0)

## 2024-02-04 LAB — CBC WITH DIFFERENTIAL/PLATELET
Abs Immature Granulocytes: 0.03 K/uL (ref 0.00–0.07)
Basophils Absolute: 0 K/uL (ref 0.0–0.1)
Basophils Relative: 1 %
Eosinophils Absolute: 0.1 K/uL (ref 0.0–0.5)
Eosinophils Relative: 1 %
HCT: 39 % (ref 36.0–46.0)
Hemoglobin: 13.2 g/dL (ref 12.0–15.0)
Immature Granulocytes: 0 %
Lymphocytes Relative: 27 %
Lymphs Abs: 2.2 K/uL (ref 0.7–4.0)
MCH: 29.1 pg (ref 26.0–34.0)
MCHC: 33.8 g/dL (ref 30.0–36.0)
MCV: 86.1 fL (ref 80.0–100.0)
Monocytes Absolute: 0.6 K/uL (ref 0.1–1.0)
Monocytes Relative: 8 %
Neutro Abs: 5.2 K/uL (ref 1.7–7.7)
Neutrophils Relative %: 63 %
Platelets: 233 K/uL (ref 150–400)
RBC: 4.53 MIL/uL (ref 3.87–5.11)
RDW: 14 % (ref 11.5–15.5)
WBC: 8.2 K/uL (ref 4.0–10.5)
nRBC: 0 % (ref 0.0–0.2)

## 2024-02-04 LAB — MAGNESIUM: Magnesium: 2.3 mg/dL (ref 1.7–2.4)

## 2024-02-04 LAB — COMPREHENSIVE METABOLIC PANEL WITH GFR
ALT: 12 U/L (ref 0–44)
AST: 19 U/L (ref 15–41)
Albumin: 4.2 g/dL (ref 3.5–5.0)
Alkaline Phosphatase: 90 U/L (ref 38–126)
Anion gap: 11 (ref 5–15)
BUN: 17 mg/dL (ref 6–20)
CO2: 26 mmol/L (ref 22–32)
Calcium: 8.8 mg/dL — ABNORMAL LOW (ref 8.9–10.3)
Chloride: 104 mmol/L (ref 98–111)
Creatinine, Ser: 0.73 mg/dL (ref 0.44–1.00)
GFR, Estimated: >60 mL/min (ref >60)
Glucose, Bld: 95 mg/dL (ref 70–99)
Potassium: 4.3 mmol/L (ref 3.5–5.1)
Sodium: 141 mmol/L (ref 135–145)
Total Bilirubin: 0.2 mg/dL (ref 0.0–1.2)
Total Protein: 8 g/dL (ref 6.5–8.1)

## 2024-02-04 MED ORDER — MECLIZINE HCL 25 MG PO TABS: 25.0000 mg | ORAL_TABLET | Freq: Three times a day (TID) | ORAL | 0 refills | Status: AC | PRN

## 2024-02-04 MED ORDER — ACETAMINOPHEN 500 MG PO TABS
1000.0000 mg | ORAL_TABLET | Freq: Once | ORAL | Status: AC
  Administered 2024-02-04: 1000 mg via ORAL
  Filled 2024-02-04: qty 2

## 2024-02-04 MED ORDER — CEPHALEXIN 500 MG PO CAPS: 500.0000 mg | ORAL_CAPSULE | Freq: Two times a day (BID) | ORAL | 0 refills | Status: AC

## 2024-02-04 MED ORDER — STERILE WATER FOR INJECTION IJ SOLN
INTRAMUSCULAR | Status: AC
  Filled 2024-02-04: qty 10

## 2024-02-04 MED ORDER — MECLIZINE HCL 12.5 MG PO TABS
25.0000 mg | ORAL_TABLET | Freq: Once | ORAL | Status: AC
  Administered 2024-02-04: 25 mg via ORAL
  Filled 2024-02-04: qty 2

## 2024-02-04 MED ORDER — CEPHALEXIN 500 MG PO CAPS: 500.0000 mg | ORAL_CAPSULE | Freq: Once | ORAL | Status: DC

## 2024-02-04 MED ORDER — ONDANSETRON HCL 4 MG/2ML IJ SOLN
4.0000 mg | Freq: Once | INTRAMUSCULAR | Status: AC
  Administered 2024-02-04: 4 mg via INTRAVENOUS
  Filled 2024-02-04: qty 2

## 2024-02-04 MED ORDER — ONDANSETRON 4 MG PO TBDP: 4.0000 mg | ORAL_TABLET | Freq: Three times a day (TID) | ORAL | 0 refills | Status: AC | PRN

## 2024-02-04 MED ORDER — SODIUM CHLORIDE 0.9 % IV SOLN
1.0000 g | Freq: Once | INTRAVENOUS | Status: AC
  Administered 2024-02-04: 1 g via INTRAVENOUS
  Filled 2024-02-04: qty 10

## 2024-02-04 MED ORDER — ONDANSETRON HCL 4 MG PO TABS: 4.0000 mg | ORAL_TABLET | Freq: Once | ORAL | Status: DC

## 2024-02-04 NOTE — Discharge Instructions (Addendum)
 Gracias por acudir al [employer] [facility]. Le atendimos por dolor al orinar y dolor en la parte baja del abdomen. Le realizamos un examen fsico, anlisis de laboratorio e imgenes, que revelaron una infeccin del tracto urinario. Por favor, tome Keflex  500 mg dos veces al da durante 7 [phone]  [id number]. Puede tomar Zofran  4 mg sublingual segn sea necesario cada 6 a 8 horas para las nuseas y los vmitos. Tambien puede tomar Antivert 25 mg cuando sea necesario cada 8 horas para los mareos. Por favor, consulte con su mdico de cabecera dentro de una semana.  No dude en regresar a Journalist, newspaper al 911 si experimenta: -Empeoramiento de los sntomas -Dolor en el costado -Nuseas o vmitos tan intensos que print production planner, beber o tomar sus medicamentos -Mareo o desmayo -Fiebre o escalofros -Cualquier otra cosa que le preocupe   Thank you for coming to [facility] Emergency Department. You were seen for painful urination and lower abdominal pain. We did an exam, labs, and imaging, and these showed a urinary tract infection. Please take keflex  500 mg twice per day for 7 days. You can take zofran  4 mg under the tongue as needed every 6-8 hours for nausea/vomiting. You can also take antivert 25 mg as needed for dizziness up to 3 times per day.  Please follow up with your primary care provider within 1 week.   Do not hesitate to return to the ED or call 911 if you experience: -Worsening symptoms -Flank pain -Nausea/vomiting so severe that you cannot eat, drink, or take your medications -Lightheadedness, passing out -Fevers/chills -Anything else that concerns you

## 2024-02-04 NOTE — ED Notes (Signed)
 Pt/family received d/c paperwork at this time. After going over the paperwork any questions, comments, or concerns were answered to the best of this nurse's knowledge. The pt/family verbally acknowledged the teachings/instructions.
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
## 2024-02-04 NOTE — ED Triage Notes (Signed)
 Interpreter used.   Urinary frequency x 2 days. Lower back pain and headache started today. Took OTC medication. Nausea and dizziness.

## 2024-02-06 ENCOUNTER — Other Ambulatory Visit: Payer: Self-pay | Admitting: Physician Assistant

## 2024-02-06 LAB — URINE CULTURE

## 2024-02-07 ENCOUNTER — Ambulatory Visit: Payer: Self-pay | Admitting: Physician Assistant

## 2024-02-09 ENCOUNTER — Telehealth: Payer: Self-pay

## 2024-02-09 NOTE — Telephone Encounter (Signed)
 Attempted to follow up with [facility] client after recent ED visit on 02/04/24 for urinary tract infection. Interpreter services utilized today. No answer, left voicemail requesting return call.   [facility] is scheduled for 02/20/24   Joyce JONELLE Skeen RN [facility]

## 2024-02-13 ENCOUNTER — Telehealth: Payer: Self-pay

## 2024-02-13 NOTE — Telephone Encounter (Signed)
 Attempted follow up call to [facility] client and client of [facility] with interpreter services. No answer and unable to leave a voicemail.  Client's next appointment [facility] is 02/20/24   Avelina JONELLE Skeen RN  [facility]

## 2024-02-19 ENCOUNTER — Other Ambulatory Visit (HOSPITAL_COMMUNITY)
Admission: RE | Admit: 2024-02-19 | Discharge: 2024-02-19 | Disposition: A | Discharge status: Home / Self Care | Payer: Self-pay | Source: Ambulatory Visit | Attending: Physician Assistant | Admitting: Physician Assistant

## 2024-02-19 DIAGNOSIS — E785 Hyperlipidemia, unspecified: Secondary | ICD-10-CM | POA: Insufficient documentation

## 2024-02-19 LAB — LIPID PANEL
Cholesterol: 194 mg/dL (ref 0–200)
HDL: 49 mg/dL (ref >40)
LDL Cholesterol: 124 mg/dL — ABNORMAL HIGH (ref 0–99)
Total CHOL/HDL Ratio: 4 ratio
Triglycerides: 103 mg/dL (ref <150)
VLDL: 21 mg/dL (ref 0–40)

## 2024-02-20 ENCOUNTER — Encounter: Payer: Self-pay | Admitting: Physician Assistant

## 2024-02-20 ENCOUNTER — Ambulatory Visit: Payer: Self-pay | Admitting: Physician Assistant

## 2024-02-20 VITALS — BP 135/86 | HR 74 | Temp 97.7°F | Ht 60.0 in | Wt 142.0 lb

## 2024-02-20 DIAGNOSIS — N3090 Cystitis, unspecified without hematuria: Secondary | ICD-10-CM

## 2024-02-20 DIAGNOSIS — I10 Essential (primary) hypertension: Secondary | ICD-10-CM

## 2024-02-20 DIAGNOSIS — E785 Hyperlipidemia, unspecified: Secondary | ICD-10-CM

## 2024-02-20 DIAGNOSIS — Z789 Other specified health status: Secondary | ICD-10-CM

## 2024-02-20 DIAGNOSIS — R140 Abdominal distension (gaseous): Secondary | ICD-10-CM

## 2024-02-20 DIAGNOSIS — R300 Dysuria: Secondary | ICD-10-CM

## 2024-02-20 DIAGNOSIS — K649 Unspecified hemorrhoids: Secondary | ICD-10-CM

## 2024-02-20 LAB — POCT URINALYSIS DIPSTICK
Bilirubin, UA: NEGATIVE
Glucose, UA: NEGATIVE
Ketones, UA: NEGATIVE
Leukocytes, UA: NEGATIVE
Nitrite, UA: NEGATIVE
Protein, UA: NEGATIVE
Spec Grav, UA: 1.01 (ref 1.010–1.025)
Urobilinogen, UA: 0.2 U/dL
pH, UA: 6.5 (ref 5.0–8.0)

## 2024-02-20 MED ORDER — CEPHALEXIN 500 MG PO CAPS: 500.0000 mg | ORAL_CAPSULE | Freq: Four times a day (QID) | ORAL | 0 refills | Status: AC

## 2024-02-20 NOTE — Progress Notes (Signed)
 BP 135/86   Pulse 74   Temp 97.7 F (36.5 C)   Ht 5' (1.524 m)   Wt 142 lb (64.4 kg)   SpO2 99%   BMI 27.73 kg/m    Subjective:    Patient ID: Joyce Roberts, female    DOB: 1971-03-08, 53 y.o.   MRN: [id number]  HPI: Joyce Roberts is a 53 y.o. female presenting on 02/20/2024 for Hypertension, Hyperlipidemia, Dysuria (Pt states she feels some burning when urinating, some urinary frequency. Since last week.), and Bloated (Pt states she feels her stomach gets bloated after eating and is also bloated when she wakes up. Pt states she takes tylenol  for body aches and HA, but is starting to feel like its making her constipated. Pt states she eats peanuts which helps with the constipation. Pt states her last BM was today.  Pt states she has a BM everyday but her stools have been little balls of stool. Pt states she has noticed some blood when she wipes.)   HPI   Chief Complaint  Patient presents with   Hypertension   Hyperlipidemia   Dysuria    Pt states she feels some burning when urinating, some urinary frequency. Since last week.   Bloated    Pt states she feels her stomach gets bloated after eating and is also bloated when she wakes up. Pt states she takes tylenol  for body aches and HA, but is starting to feel like its making her constipated. Pt states she eats peanuts which helps with the constipation. Pt states her last BM was today.  Pt states she has a BM everyday but her stools have been little balls of stool. Pt states she has noticed some blood when she wipes.    She last saw GI in 2022.  She was to follow up there but never did.  Pt says her [facility] financial assistance expired and she didn't renew it. She last saw [facility] in September   Relevant past medical, surgical, family and social history reviewed and updated as indicated. Interim medical history since our last visit reviewed. Allergies and medications reviewed and updated.   Current Outpatient Medications:     acetaminophen  (TYLENOL ) 500 MG tablet, Take 1,000 mg by mouth as needed., Disp: , Rfl:    amLODipine  (NORVASC ) 10 MG tablet, Take 1 tablet (10 mg total) by mouth daily. Tome una tableta por boca diaria, Disp: 90 tablet, Rfl: 1   atorvastatin  (LIPITOR) 20 MG tablet, Take 1 tablet (20 mg total) by mouth daily., Disp: 90 tablet, Rfl: 1   cetirizine  (ZYRTEC ) 10 MG tablet, TAKE 1 Tablet BY MOUTH ONCE DAILY AS NEEDED FOR ALERGIAS, Disp: 90 tablet, Rfl: 0   citalopram  (CELEXA ) 20 MG tablet, TAKE 1 Tablet BY MOUTH ONCE DAILY, Disp: 90 tablet, Rfl: 0   ibuprofen  (ADVIL ) 200 MG tablet, Take 200 mg by mouth daily as needed., Disp: , Rfl:    losartan  (COZAAR ) 100 MG tablet, Take 1 tablet (100 mg total) by mouth daily. Tome una tableta por boca diaria, Disp: 90 tablet, Rfl: 1   omeprazole  (PRILOSEC) 20 MG capsule, TAKE 1 Capsule  BY MOUTH TWICE DAILY, Disp: 180 capsule, Rfl: 0   azelastine  (ASTELIN ) 0.1 % nasal spray, 1 or 2 sprays each nostril twice daily.  1 o 2 espray en cada lado de la [employer] al dia (Patient not taking: Reported on 02/20/2024), Disp: 11 mL, Rfl: PRN   celecoxib  (CELEBREX ) 200 MG capsule, Take 1 capsule (  200 mg total) by mouth 2 (two) times daily. (Patient not taking: Reported on 02/20/2024), Disp: 20 capsule, Rfl: 0   gabapentin  (NEURONTIN ) 100 MG capsule, Take 1 capsule (100 mg total) by mouth 3 (three) times daily. (Patient not taking: Reported on 02/20/2024), Disp: 90 capsule, Rfl: 0   meclizine  (ANTIVERT ) 25 MG tablet, Take 1 tablet (25 mg total) by mouth 3 (three) times daily as needed for dizziness. (Patient not taking: Reported on 02/20/2024), Disp: 30 tablet, Rfl: 0   ondansetron  (ZOFRAN -ODT) 4 MG disintegrating tablet, Take 1 tablet (4 mg total) by mouth every 8 (eight) hours as needed. (Patient not taking: Reported on 02/20/2024), Disp: 20 tablet, Rfl: 0   promethazine  (PHENERGAN ) 12.5 MG tablet, Take 2 tablets (25 mg total) by mouth every 6 (six) hours as needed for nausea or  vomiting. (Patient not taking: Reported on 02/20/2024), Disp: 20 tablet, Rfl: 0   promethazine -dextromethorphan (PROMETHAZINE -DM) 6.25-15 MG/5ML syrup, take 2.5 mLs by mouth 4 (four) times daily as needed for cough. Tome 2.5 ML por boca 4 veces al dia cuando sea necesario para la toz (Patient not taking: Reported on 02/20/2024), Disp: 118 mL, Rfl: 0   pseudoephedrine  (SUDAFED) 60 MG tablet, Take 1 tablet (60 mg total) by mouth every 8 (eight) hours as needed for congestion. Tome una tableta por boca cada 8 horas cuando sea necesario para la congestion (Patient not taking: Reported on 02/20/2024), Disp: 30 tablet, Rfl: 1   traMADol  (ULTRAM ) 50 MG tablet, Take 1 tablet (50 mg total) by mouth every 6 (six) hours as needed for severe pain (pain score 7-10). (Patient not taking: Reported on 02/20/2024), Disp: 15 tablet, Rfl: 0   valACYclovir  (VALTREX ) 1000 MG tablet, Take 1 tablet (1,000 mg total) by mouth 3 (three) times daily. (Patient not taking: Reported on 02/20/2024), Disp: 21 tablet, Rfl: 0    Review of Systems  Per HPI unless specifically indicated above     Objective:    BP 135/86   Pulse 74   Temp 97.7 F (36.5 C)   Ht 5' (1.524 m)   Wt 142 lb (64.4 kg)   SpO2 99%   BMI 27.73 kg/m   Wt Readings from Last 3 Encounters:  02/20/24 142 lb (64.4 kg)  02/04/24 143 lb (64.9 kg)  12/26/23 143 lb (64.9 kg)    Physical Exam Vitals reviewed.  Constitutional:      General: She is not in acute distress.    Appearance: She is well-developed. She is not toxic-appearing.  HENT:     Head: Normocephalic and atraumatic.  Cardiovascular:     Rate and Rhythm: Normal rate and regular rhythm.  Pulmonary:     Effort: Pulmonary effort is normal.     Breath sounds: Normal breath sounds.  Abdominal:     General: Bowel sounds are normal.     Palpations: Abdomen is soft. There is no hepatomegaly, splenomegaly, mass or pulsatile mass.     Tenderness: There is no abdominal tenderness.   Musculoskeletal:     Cervical back: Neck supple.     Right lower leg: No edema.     Left lower leg: No edema.  Lymphadenopathy:     Cervical: No cervical adenopathy.  Skin:    General: Skin is warm and dry.  Neurological:     Mental Status: She is alert and oriented to person, place, and time.  Psychiatric:        Behavior: Behavior normal.     Results for orders placed  or performed in visit on 02/20/24  POCT Urinalysis Dipstick   Collection Time: 02/20/24  1:22 PM  Result Value Ref Range   Color, UA YELLOW    Clarity, UA CLEAR    Glucose, UA Negative Negative   Bilirubin, UA NEG    Ketones, UA NEG    Spec Grav, UA 1.010 1.010 - 1.025   Blood, UA TRACE-INTACT    pH, UA 6.5 5.0 - 8.0   Protein, UA Negative Negative   Urobilinogen, UA 0.2 0.2 or 1.0 E.U./dL   Nitrite, UA NEG    Leukocytes, UA Negative Negative   Appearance     Odor        Assessment & Plan:    Encounter Diagnoses  Name Primary?   Primary hypertension Yes   Hyperlipidemia, unspecified hyperlipidemia type    Cystitis    Dysuria    Hemorrhoids, unspecified hemorrhoid type    Bloating symptom    Not proficient in English language      -reviewed labs with pt -pt to continue atorvastatin  for lipids -pt to continue amlodipine  and losartan  for htn -pt was counseled on hemorrhoids which were seen on colonoscopy.  -pt was given application for cafa and encouraged to contact GI for follow up appointment.  She is to let us  know if they tell her she needs a new referral -pt to follow up three months.  She is to contact office sooner prn

## 2024-02-20 NOTE — Patient Instructions (Signed)
 Hemorroides Hemorrhoids Las hemorroides son venas hinchadas en el recto o la zona que lo rodea, o en la abertura de las nalgas (ano). Hay dos tipos de hemorroides: Internas. Se forman en las venas del interior del recto. Pueden abultarse hacia afuera, irritarse y doler. Externas. Estas se producen en las venas que estn fuera del ano. Pueden sentirse como una hinchazn o un bulto duro dolorosos cerca del ano. La mayora de las hemorroides no causan problemas graves. A menudo, pueden tratarse en casa con cambios en la dieta y el estilo de vida. Si los tratamientos caseros no ayudan, quizs necesite un procedimiento para reducir o extirpar las hemorroides. Cules son las causas? Las hemorroides son causadas por la presin cerca del ano. Esta presin puede deberse a lo siguiente: Estreimiento o diarrea. Hace esfuerzo para eliminar heces. Embarazo. Obesidad. Estar sentado o andar en bicicleta durante mucho tiempo. Levantar objetos pesados u otras cosas que impliquen esfuerzo. Sexo anal. Cules son los signos o sntomas? Los sntomas de esta afeccin incluyen: Engineer, mining. Picazn o irritacin anal. Sangrado que proviene del recto. Fuga de materia fecal (heces). Hinchazn del ano. Uno o ms bultos alrededor del ano. Cmo se diagnostica? Las hemorroides se diagnostican frecuentemente a travs de un examen visual. Posiblemente le realicen otros tipos de pruebas o estudios, como los siguientes: Psychiatrist. Esto lo realiza el mdico mediante tacto rectal con un dedo enguantado. Anoscopia. Este es un examen del ano que se hace con un tubo pequeo. Anlisis de sangre si ha perdido [employer]. Sigmoidoscopa o colonoscopa. Estos son estudios para observar el interior del colon a travs de un tubo que tiene una cmara en el extremo. Cmo se trata? En la [employer], las hemorroides se pueden tratar en casa con cambios en la dieta y el estilo de vida. Si estos cambios no  resultan eficaces, tal vez deba someterse a un procedimiento. Estos procedimientos pueden reducir las hemorroides o extirparlas completamente. Algunos de los procedimientos ms frecuentes son los siguientes: Ligadura con Curator. Las bandas elsticas se colocan en la base de las hemorroides para interrumpir su irrigacin de [place]. Escleroterapia. Se pone un medicamento dentro de las hemorroides para reducir su tamao. Coagulacin con luz infrarroja. Se utiliza un tipo de energa lumnica para eliminar las hemorroides. Hemorroidectoma. Las hemorroides se extirpan durante la [place]. Luego, las venas que las irrigan se atan para Geologist, engineering. Hemorroidopexia con grapas. La base de las hemorroides se engrapa a la pared del recto. Siga estas instrucciones en su casa: Medicamentos Use los medicamentos de venta libre y los recetados solamente como se lo haya indicado el mdico. Use cremas medicadas o medicamentos medicinales que se ponen en el recto (supositorios) como se lo haya indicado el mdico. Comida y bebida  Consumir alimentos ricos en fibra, como frijoles, cereales integrales, y frutas y verduras frescas. Pregntele a su mdico acerca de tomar productos con fibra aadida (suplementos de [place]). Disminuya la cantidad de grasa de la dieta. Esto se puede lograr consumiendo productos lcteos con bajo contenido de grasas, ingiriendo menor cantidad de carnes rojas y evitando los alimentos procesados. Beber suficiente lquido para Radio producer pis (orina) de color amarillo plido. Control del dolor y la hinchazn  Tome baos de asiento tibios durante 20 minutos, 3 a 4 veces por Futures trader. Esto puede ayudar a Glass blower/designer y Environmental health practitioner. Puede hacer esto en una baera o usar un dispositivo porttil para bao de asiento que se coloca sobre el inodoro. Si se  lo indican, aplique hielo en la zona afectada. El uso de compresas de hielo entre baos de asiento puede ser de [place]. Ponga el hielo en una  bolsa plstica. Coloque una toalla entre la piel y Copy. Aplique el hielo durante 20 minutos, 2 a 3 veces por da. Si la piel se le pone de color rojo brillante, retire el hielo de inmediato para evitar daos en la piel. [place] de dao es mayor si no puede sentir dolor, Airline pilot o fro. Instrucciones generales Actividad fsica. Consulte al mdico qu cantidad y qu tipo de ejercicio es mejor para usted. En general, debe realizar al menos 30 minutos de ejercicio moderado la [employer] de la semana (150 minutos cada semana). Es recomendable que pruebe con caminar, andar en bicicleta o practicar yoga. Vaya al bao cuando sienta ganas de defecar. No espere. Evite hacer esfuerzos para defecar. Mantenga el ano limpio y seco. Use papel higinico hmedo o toallitas humedecidas despus de defecar. No pase mucho tiempo sentado en el inodoro. Esto puede aumentar la afluencia de sangre y Chief Technology Officer. Dnde buscar ms informacin [employer] of Diabetes and Digestive and Kidney Diseases [employer] de la Diabetes y las Enfermedades Digestivas y Renales): [employer].si Comunquese con un mdico si: Tiene ms dolor e hinchazn que no mejoran con Scientist, research (medical). Tiene dificultad para defecar o no puede hacerlo. Siente dolor o tiene inflamacin fuera de la zona de las hemorroides. Solicite ayuda de inmediato si: Tiene sangrado del recto y no puede detenerlo. Esta informacin no tiene Theme park manager el consejo del mdico. Asegrese de hacerle al mdico cualquier pregunta que tenga. Document Revised: 12/30/2021 Document Reviewed: 12/30/2021 Elsevier Patient Education  2024 [employer].

## 2024-03-26 ENCOUNTER — Encounter: Payer: Self-pay | Admitting: Physician Assistant

## 2024-03-26 ENCOUNTER — Ambulatory Visit: Payer: Self-pay | Admitting: Physician Assistant

## 2024-03-26 VITALS — BP 126/80 | HR 90 | Temp 97.8°F | Ht 60.0 in | Wt 143.5 lb

## 2024-03-26 DIAGNOSIS — K5900 Constipation, unspecified: Secondary | ICD-10-CM

## 2024-03-26 DIAGNOSIS — R319 Hematuria, unspecified: Secondary | ICD-10-CM

## 2024-03-26 DIAGNOSIS — Z789 Other specified health status: Secondary | ICD-10-CM

## 2024-03-26 DIAGNOSIS — R300 Dysuria: Secondary | ICD-10-CM

## 2024-03-26 LAB — POCT URINALYSIS DIPSTICK
Bilirubin, UA: NEGATIVE
Glucose, UA: NEGATIVE
Leukocytes, UA: NEGATIVE
Nitrite, UA: NEGATIVE
Protein, UA: NEGATIVE
Spec Grav, UA: 1.025
Urobilinogen, UA: 0.2 U/dL
pH, UA: 5

## 2024-03-26 MED ORDER — PHENAZOPYRIDINE HCL 100 MG PO TABS: 100.0000 mg | ORAL_TABLET | Freq: Three times a day (TID) | ORAL | 0 refills | Status: AC | PRN

## 2024-03-26 NOTE — Patient Instructions (Addendum)
 Submit application for [employer] financial assistance Entregue la aplicacion para la ayuda financiera de [employer]   Drink 6 bottles water  every day Beba 6 botellas de agua al dia  Call GI doctor to get scheduled for follow up (let us  know if they tell you that you need a new referral) Llame al gatroenterology para programar su cita de seguimiento ( dejenos saber si ocupamos ingresar una nueva orden)

## 2024-03-26 NOTE — Progress Notes (Signed)
 "  BP 126/80   Pulse 90   Temp 97.8 F (36.6 C)   Ht 5' (1.524 m)   Wt 143 lb 8 oz (65.1 kg)   SpO2 97%   BMI 28.03 kg/m    Subjective:    Patient ID: Joyce Roberts, female    DOB: [birth year], 53 y.o.   MRN: [id number]  HPI: Joyce Roberts is a 53 y.o. female presenting on 03/26/2024 for Dysuria (Pt states she has been having burning while urinating for the past 10 days worsening each day. Pt states she feels as if her vaginal area is irritated when she urinates. Pt states her urine is usually dark even with drinking plenty of water .) and Constipation (Pt states she feels her constipation has worsened. Pt states she has been taking more tylenol  than IBU, but feels it constipates her. Pt states she has BM daily, but her stool has been little hard balls.)   HPI  Chief Complaint  Patient presents with   Dysuria    Pt states she has been having burning while urinating for the past 10 days worsening each day. Pt states she feels as if her vaginal area is irritated when she urinates. Pt states her urine is usually dark even with drinking plenty of water .   Constipation    Pt states she feels her constipation has worsened. Pt states she has been taking more tylenol  than IBU, but feels it constipates her. Pt states she has BM daily, but her stool has been little hard balls.      Pt was seen in office 02/20/24 and prescribed keflex  for UTI.   Pt had been to ER on 02/04/24 for urinary issues- culture done in ER showed multiple species.  Pt seen for same in office 08/08/23 and culture was negative.  Pt was referred to urology in may but she was never scheduled because that office could not get in touch with her.    Pt has been seen by urology in the past (for dysura, OAB and hematuria).  Her last OV with urology was 12/21/21; she was supposed to follow up there three months later but did not.   Pt was encouraged to contact GI for follow up  She hasn't updated her cafa   She doesn't  take anything to help with her constipation.  She eats nuts and says that works.    Relevant past medical, surgical, family and social history reviewed and updated as indicated. Interim medical history since our last visit reviewed. Allergies and medications reviewed and updated.   Current Outpatient Medications:    acetaminophen  (TYLENOL ) 500 MG tablet, Take 1,000 mg by mouth as needed., Disp: , Rfl:    amLODipine  (NORVASC ) 10 MG tablet, Take 1 tablet (10 mg total) by mouth daily. Tome una tableta por boca diaria, Disp: 90 tablet, Rfl: 1   atorvastatin  (LIPITOR) 20 MG tablet, Take 1 tablet (20 mg total) by mouth daily., Disp: 90 tablet, Rfl: 1   cetirizine  (ZYRTEC ) 10 MG tablet, TAKE 1 Tablet BY MOUTH ONCE DAILY AS NEEDED FOR ALERGIAS, Disp: 90 tablet, Rfl: 0   citalopram  (CELEXA ) 20 MG tablet, TAKE 1 Tablet BY MOUTH ONCE DAILY, Disp: 90 tablet, Rfl: 0   ibuprofen  (ADVIL ) 200 MG tablet, Take 200 mg by mouth daily as needed., Disp: , Rfl:    losartan  (COZAAR ) 100 MG tablet, Take 1 tablet (100 mg total) by mouth daily. Tome una tableta por boca diaria, Disp: 90 tablet, Rfl: 1  omeprazole  (PRILOSEC) 20 MG capsule, TAKE 1 Capsule  BY MOUTH TWICE DAILY, Disp: 180 capsule, Rfl: 0   azelastine  (ASTELIN ) 0.1 % nasal spray, 1 or 2 sprays each nostril twice daily.  1 o 2 espray en cada lado de la [employer] al dia (Patient not taking: Reported on 03/26/2024), Disp: 11 mL, Rfl: PRN   celecoxib  (CELEBREX ) 200 MG capsule, Take 1 capsule (200 mg total) by mouth 2 (two) times daily. (Patient not taking: Reported on 03/26/2024), Disp: 20 capsule, Rfl: 0   gabapentin  (NEURONTIN ) 100 MG capsule, Take 1 capsule (100 mg total) by mouth 3 (three) times daily. (Patient not taking: Reported on 03/26/2024), Disp: 90 capsule, Rfl: 0   meclizine  (ANTIVERT ) 25 MG tablet, Take 1 tablet (25 mg total) by mouth 3 (three) times daily as needed for dizziness. (Patient not taking: Reported on 03/26/2024), Disp: 30 tablet,  Rfl: 0   ondansetron  (ZOFRAN -ODT) 4 MG disintegrating tablet, Take 1 tablet (4 mg total) by mouth every 8 (eight) hours as needed. (Patient not taking: Reported on 03/26/2024), Disp: 20 tablet, Rfl: 0   promethazine  (PHENERGAN ) 12.5 MG tablet, Take 2 tablets (25 mg total) by mouth every 6 (six) hours as needed for nausea or vomiting. (Patient not taking: Reported on 03/26/2024), Disp: 20 tablet, Rfl: 0   promethazine -dextromethorphan (PROMETHAZINE -DM) 6.25-15 MG/5ML syrup, take 2.5 mLs by mouth 4 (four) times daily as needed for cough. Tome 2.5 ML por boca 4 veces al dia cuando sea necesario para la toz (Patient not taking: Reported on 03/26/2024), Disp: 118 mL, Rfl: 0   pseudoephedrine  (SUDAFED) 60 MG tablet, Take 1 tablet (60 mg total) by mouth every 8 (eight) hours as needed for congestion. Tome una tableta por boca cada 8 horas cuando sea necesario para la congestion (Patient not taking: Reported on 03/26/2024), Disp: 30 tablet, Rfl: 1   traMADol  (ULTRAM ) 50 MG tablet, Take 1 tablet (50 mg total) by mouth every 6 (six) hours as needed for severe pain (pain score 7-10). (Patient not taking: Reported on 03/26/2024), Disp: 15 tablet, Rfl: 0   valACYclovir  (VALTREX ) 1000 MG tablet, Take 1 tablet (1,000 mg total) by mouth 3 (three) times daily. (Patient not taking: Reported on 03/26/2024), Disp: 21 tablet, Rfl: 0       Review of Systems  Per HPI unless specifically indicated above     Objective:    BP 126/80   Pulse 90   Temp 97.8 F (36.6 C)   Ht 5' (1.524 m)   Wt 143 lb 8 oz (65.1 kg)   SpO2 97%   BMI 28.03 kg/m   Wt Readings from Last 3 Encounters:  03/26/24 143 lb 8 oz (65.1 kg)  02/20/24 142 lb (64.4 kg)  02/04/24 143 lb (64.9 kg)    Physical Exam Vitals reviewed.  Constitutional:      General: She is not in acute distress.    Appearance: She is well-developed. She is not ill-appearing or toxic-appearing.  HENT:     Head: Normocephalic and atraumatic.  Cardiovascular:      Rate and Rhythm: Normal rate and regular rhythm.  Pulmonary:     Effort: Pulmonary effort is normal.     Breath sounds: Normal breath sounds.  Abdominal:     General: Bowel sounds are normal.     Palpations: Abdomen is soft. There is no mass.     Tenderness: There is no abdominal tenderness. There is no guarding or rebound.  Musculoskeletal:     Cervical  back: Neck supple.     Right lower leg: No edema.     Left lower leg: No edema.  Lymphadenopathy:     Cervical: No cervical adenopathy.  Skin:    General: Skin is warm and dry.  Neurological:     Mental Status: She is alert and oriented to person, place, and time.  Psychiatric:        Behavior: Behavior normal.     Results for orders placed or performed in visit on 03/26/24  POCT Urinalysis Dipstick   Collection Time: 03/26/24  2:46 PM  Result Value Ref Range   Color, UA YELLOW    Clarity, UA CLEAR    Glucose, UA Negative Negative   Bilirubin, UA NEG    Ketones, UA TRACE    Spec Grav, UA 1.025 1.010 - 1.025   Blood, UA MODERATE    pH, UA 5.0 5.0 - 8.0   Protein, UA Negative Negative   Urobilinogen, UA 0.2 0.2 or 1.0 E.U./dL   Nitrite, UA NEG    Leukocytes, UA Negative Negative   Appearance     Odor        Assessment & Plan:    Encounter Diagnoses  Name Primary?   Dysuria Yes   Hematuria, unspecified type    Constipation, unspecified constipation type    Not proficient in English language      Constipation -discussed increasing water  intake.  If nuts solve her constipation, she can eat those.  She is encouraged to follow up with GI as was recommended to her previously  Hematuria/dysura -Trial of pyridium  -increase water  intake -nurse called and got pt appointment from referral sent in May  -Pt is reminded to update cafa -Pt to follow up in February as scheduled.  RTO sooner prn  "

## 2024-05-22 ENCOUNTER — Ambulatory Visit: Payer: Self-pay | Admitting: Physician Assistant

## 2024-07-01 ENCOUNTER — Ambulatory Visit: Payer: Self-pay | Admitting: Urology
